# Patient Record
Sex: Female | Born: 1980
Health system: Southern US, Community
[De-identification: ages and names within clinical notes are randomized; demographics above are authoritative.]

## PROBLEM LIST (undated history)

## (undated) ENCOUNTER — Inpatient Hospital Stay (HOSPITAL_COMMUNITY): Payer: Self-pay

## (undated) DIAGNOSIS — M5020 Other cervical disc displacement, unspecified cervical region: Secondary | ICD-10-CM

## (undated) DIAGNOSIS — M7918 Myalgia, other site: Secondary | ICD-10-CM

## (undated) DIAGNOSIS — F32A Depression, unspecified: Secondary | ICD-10-CM

## (undated) DIAGNOSIS — F329 Major depressive disorder, single episode, unspecified: Secondary | ICD-10-CM

## (undated) DIAGNOSIS — I8390 Asymptomatic varicose veins of unspecified lower extremity: Secondary | ICD-10-CM

## (undated) DIAGNOSIS — E042 Nontoxic multinodular goiter: Secondary | ICD-10-CM

## (undated) DIAGNOSIS — H1132 Conjunctival hemorrhage, left eye: Secondary | ICD-10-CM

## (undated) DIAGNOSIS — M7711 Lateral epicondylitis, right elbow: Secondary | ICD-10-CM

## (undated) DIAGNOSIS — E785 Hyperlipidemia, unspecified: Secondary | ICD-10-CM

## (undated) DIAGNOSIS — O09899 Supervision of other high risk pregnancies, unspecified trimester: Secondary | ICD-10-CM

## (undated) DIAGNOSIS — M5136 Other intervertebral disc degeneration, lumbar region: Secondary | ICD-10-CM

## (undated) DIAGNOSIS — R87629 Unspecified abnormal cytological findings in specimens from vagina: Secondary | ICD-10-CM

## (undated) DIAGNOSIS — E049 Nontoxic goiter, unspecified: Secondary | ICD-10-CM

## (undated) DIAGNOSIS — M25559 Pain in unspecified hip: Secondary | ICD-10-CM

## (undated) DIAGNOSIS — O09529 Supervision of elderly multigravida, unspecified trimester: Secondary | ICD-10-CM

## (undated) DIAGNOSIS — M222X9 Patellofemoral disorders, unspecified knee: Secondary | ICD-10-CM

## (undated) DIAGNOSIS — M62830 Muscle spasm of back: Secondary | ICD-10-CM

## (undated) DIAGNOSIS — D649 Anemia, unspecified: Secondary | ICD-10-CM

## (undated) DIAGNOSIS — N3281 Overactive bladder: Secondary | ICD-10-CM

## (undated) DIAGNOSIS — H8110 Benign paroxysmal vertigo, unspecified ear: Secondary | ICD-10-CM

## (undated) HISTORY — DX: Benign paroxysmal vertigo, unspecified ear: H81.10

## (undated) HISTORY — DX: Pain in unspecified hip: M25.559

## (undated) HISTORY — DX: Supervision of elderly multigravida, unspecified trimester: O09.529

## (undated) HISTORY — DX: Unspecified abnormal cytological findings in specimens from vagina: R87.629

## (undated) HISTORY — DX: Asymptomatic varicose veins of unspecified lower extremity: I83.90

## (undated) HISTORY — DX: Supervision of other high risk pregnancies, unspecified trimester: O09.899

## (undated) HISTORY — DX: Conjunctival hemorrhage, left eye: H11.32

## (undated) HISTORY — DX: Patellofemoral disorders, unspecified knee: M22.2X9

## (undated) HISTORY — DX: Muscle spasm of back: M62.830

## (undated) HISTORY — DX: Lateral epicondylitis, right elbow: M77.11

## (undated) HISTORY — DX: Hyperlipidemia, unspecified: E78.5

## (undated) HISTORY — DX: Other intervertebral disc degeneration, lumbar region: M51.36

## (undated) HISTORY — DX: Overactive bladder: N32.81

## (undated) HISTORY — DX: Myalgia, other site: M79.18

## (undated) HISTORY — DX: Nontoxic multinodular goiter: E04.2

---
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
## 2008-10-19 DIAGNOSIS — E042 Nontoxic multinodular goiter: Secondary | ICD-10-CM

## 2008-10-19 HISTORY — DX: Nontoxic multinodular goiter: E04.2

## 2010-12-14 NOTE — L&D Delivery Note (Signed)
Delivery Note At 12:33 PM a viable and healthy female was delivered via Vaginal, Spontaneous Delivery (Presentation: Left Occiput Anterior).  APGAR: 9, 9; weight 6 lb 15 oz (3147 g).   Placenta status: Intact, Spontaneous.  Cord: 3 vessels with the following complications: None.  Pushed x 1 hr  and delivered in squatting position.   Anesthesia:  Local  Episiotomy: None Lacerations: 2nd degree;Perineal Suture Repair: 3.0 vicryl rapide Est. Blood Loss (mL): 350  Mom to postpartum.  Baby to nursery-stable.  Dawn [name] 07/25/2011, 1:24 PM

## 2010-12-22 ENCOUNTER — Ambulatory Visit
Admission: RE | Admit: 2010-12-22 | Discharge: 2010-12-22 | Payer: Self-pay | Source: Home / Self Care | Attending: Obstetrics & Gynecology | Admitting: Obstetrics & Gynecology

## 2010-12-23 ENCOUNTER — Encounter: Payer: Self-pay | Admitting: Physician Assistant

## 2010-12-23 LAB — CONVERTED CEMR LAB
Antibody Screen: NEGATIVE
Eosinophils Relative: 1 % (ref 0–5)
HCT: 37.6 % (ref 36.0–46.0)
Hemoglobin: 12.6 g/dL (ref 12.0–15.0)
Lymphocytes Relative: 25 % (ref 12–46)
Monocytes Absolute: 0.7 10*3/uL (ref 0.1–1.0)
Monocytes Relative: 7 % (ref 3–12)
Neutro Abs: 6.5 10*3/uL (ref 1.7–7.7)
RBC: 4.33 M/uL (ref 3.87–5.11)
Rh Type: POSITIVE
Rubella: >500 intl units/mL — ABNORMAL HIGH
TSH: 0.416 microintl units/mL (ref 0.350–4.500)

## 2011-01-07 ENCOUNTER — Other Ambulatory Visit: Payer: Self-pay | Admitting: Obstetrics & Gynecology

## 2011-01-07 ENCOUNTER — Ambulatory Visit
Admission: RE | Admit: 2011-01-07 | Discharge: 2011-01-07 | Payer: Self-pay | Source: Home / Self Care | Attending: Obstetrics & Gynecology | Admitting: Obstetrics & Gynecology

## 2011-01-07 DIAGNOSIS — Z3682 Encounter for antenatal screening for nuchal translucency: Secondary | ICD-10-CM

## 2011-01-22 ENCOUNTER — Other Ambulatory Visit (HOSPITAL_COMMUNITY): Payer: Self-pay

## 2011-01-22 ENCOUNTER — Ambulatory Visit (HOSPITAL_COMMUNITY)
Admission: RE | Admit: 2011-01-22 | Discharge: 2011-01-22 | Disposition: A | Discharge status: Home / Self Care | Payer: Federal, State, Local not specified - PPO | Source: Ambulatory Visit | Attending: Obstetrics & Gynecology | Admitting: Obstetrics & Gynecology

## 2011-01-22 ENCOUNTER — Encounter (HOSPITAL_COMMUNITY): Payer: Self-pay

## 2011-01-22 DIAGNOSIS — Z3689 Encounter for other specified antenatal screening: Secondary | ICD-10-CM | POA: Insufficient documentation

## 2011-01-22 DIAGNOSIS — O351XX0 Maternal care for (suspected) chromosomal abnormality in fetus, not applicable or unspecified: Secondary | ICD-10-CM | POA: Insufficient documentation

## 2011-01-22 DIAGNOSIS — Z3682 Encounter for antenatal screening for nuchal translucency: Secondary | ICD-10-CM

## 2011-01-22 DIAGNOSIS — O3510X0 Maternal care for (suspected) chromosomal abnormality in fetus, unspecified, not applicable or unspecified: Secondary | ICD-10-CM | POA: Insufficient documentation

## 2011-02-02 ENCOUNTER — Other Ambulatory Visit: Payer: Self-pay | Admitting: Obstetrics & Gynecology

## 2011-02-02 DIAGNOSIS — O26849 Uterine size-date discrepancy, unspecified trimester: Secondary | ICD-10-CM

## 2011-02-02 DIAGNOSIS — Z3400 Encounter for supervision of normal first pregnancy, unspecified trimester: Secondary | ICD-10-CM

## 2011-02-17 ENCOUNTER — Encounter: Payer: Federal, State, Local not specified - PPO | Admitting: Obstetrics & Gynecology

## 2011-02-17 DIAGNOSIS — Z3480 Encounter for supervision of other normal pregnancy, unspecified trimester: Secondary | ICD-10-CM

## 2011-02-18 ENCOUNTER — Encounter: Payer: Self-pay | Admitting: Obstetrics & Gynecology

## 2011-02-19 ENCOUNTER — Ambulatory Visit (HOSPITAL_COMMUNITY)
Admission: RE | Admit: 2011-02-19 | Discharge: 2011-02-19 | Disposition: A | Discharge status: Home / Self Care | Payer: Federal, State, Local not specified - PPO | Source: Ambulatory Visit | Attending: Obstetrics & Gynecology | Admitting: Obstetrics & Gynecology

## 2011-02-19 DIAGNOSIS — O351XX0 Maternal care for (suspected) chromosomal abnormality in fetus, not applicable or unspecified: Secondary | ICD-10-CM | POA: Insufficient documentation

## 2011-02-19 DIAGNOSIS — Z3689 Encounter for other specified antenatal screening: Secondary | ICD-10-CM | POA: Insufficient documentation

## 2011-02-19 DIAGNOSIS — O3510X0 Maternal care for (suspected) chromosomal abnormality in fetus, unspecified, not applicable or unspecified: Secondary | ICD-10-CM | POA: Insufficient documentation

## 2011-02-26 ENCOUNTER — Ambulatory Visit (HOSPITAL_COMMUNITY)
Admission: RE | Admit: 2011-02-26 | Discharge: 2011-02-26 | Disposition: A | Discharge status: Home / Self Care | Payer: Federal, State, Local not specified - PPO | Source: Ambulatory Visit | Attending: Obstetrics & Gynecology | Admitting: Obstetrics & Gynecology

## 2011-02-26 ENCOUNTER — Other Ambulatory Visit: Payer: Self-pay | Admitting: Obstetrics & Gynecology

## 2011-02-26 DIAGNOSIS — O26849 Uterine size-date discrepancy, unspecified trimester: Secondary | ICD-10-CM

## 2011-02-26 DIAGNOSIS — Z363 Encounter for antenatal screening for malformations: Secondary | ICD-10-CM | POA: Insufficient documentation

## 2011-02-26 DIAGNOSIS — Z1389 Encounter for screening for other disorder: Secondary | ICD-10-CM | POA: Insufficient documentation

## 2011-02-26 DIAGNOSIS — O358XX0 Maternal care for other (suspected) fetal abnormality and damage, not applicable or unspecified: Secondary | ICD-10-CM | POA: Insufficient documentation

## 2011-03-16 DIAGNOSIS — Z3480 Encounter for supervision of other normal pregnancy, unspecified trimester: Secondary | ICD-10-CM

## 2011-04-13 ENCOUNTER — Encounter (INDEPENDENT_AMBULATORY_CARE_PROVIDER_SITE_OTHER): Payer: Federal, State, Local not specified - PPO

## 2011-04-13 DIAGNOSIS — Z3480 Encounter for supervision of other normal pregnancy, unspecified trimester: Secondary | ICD-10-CM

## 2011-04-20 ENCOUNTER — Other Ambulatory Visit: Payer: Self-pay | Admitting: Obstetrics & Gynecology

## 2011-04-20 ENCOUNTER — Encounter (INDEPENDENT_AMBULATORY_CARE_PROVIDER_SITE_OTHER): Payer: Federal, State, Local not specified - PPO

## 2011-04-20 DIAGNOSIS — O4440 Low lying placenta NOS or without hemorrhage, unspecified trimester: Secondary | ICD-10-CM

## 2011-04-20 DIAGNOSIS — Z3480 Encounter for supervision of other normal pregnancy, unspecified trimester: Secondary | ICD-10-CM

## 2011-04-30 ENCOUNTER — Encounter: Payer: Self-pay | Admitting: Obstetrics & Gynecology

## 2011-05-01 ENCOUNTER — Ambulatory Visit (HOSPITAL_COMMUNITY)
Admission: RE | Admit: 2011-05-01 | Discharge: 2011-05-01 | Disposition: A | Discharge status: Home / Self Care | Payer: Federal, State, Local not specified - PPO | Source: Ambulatory Visit | Attending: Obstetrics & Gynecology | Admitting: Obstetrics & Gynecology

## 2011-05-01 DIAGNOSIS — O4440 Low lying placenta NOS or without hemorrhage, unspecified trimester: Secondary | ICD-10-CM

## 2011-05-01 DIAGNOSIS — Z3689 Encounter for other specified antenatal screening: Secondary | ICD-10-CM | POA: Insufficient documentation

## 2011-05-01 DIAGNOSIS — O4400 Placenta previa specified as without hemorrhage, unspecified trimester: Secondary | ICD-10-CM | POA: Insufficient documentation

## 2011-05-04 ENCOUNTER — Encounter (INDEPENDENT_AMBULATORY_CARE_PROVIDER_SITE_OTHER): Payer: Federal, State, Local not specified - PPO

## 2011-05-04 DIAGNOSIS — Z3400 Encounter for supervision of normal first pregnancy, unspecified trimester: Secondary | ICD-10-CM

## 2011-05-18 ENCOUNTER — Encounter (INDEPENDENT_AMBULATORY_CARE_PROVIDER_SITE_OTHER): Payer: Federal, State, Local not specified - PPO

## 2011-05-18 DIAGNOSIS — Z3480 Encounter for supervision of other normal pregnancy, unspecified trimester: Secondary | ICD-10-CM

## 2011-06-01 ENCOUNTER — Encounter (INDEPENDENT_AMBULATORY_CARE_PROVIDER_SITE_OTHER): Payer: Federal, State, Local not specified - PPO

## 2011-06-01 DIAGNOSIS — E049 Nontoxic goiter, unspecified: Secondary | ICD-10-CM

## 2011-06-01 DIAGNOSIS — Z3480 Encounter for supervision of other normal pregnancy, unspecified trimester: Secondary | ICD-10-CM

## 2011-06-02 ENCOUNTER — Other Ambulatory Visit: Payer: Self-pay | Admitting: Obstetrics & Gynecology

## 2011-06-02 DIAGNOSIS — Z0489 Encounter for examination and observation for other specified reasons: Secondary | ICD-10-CM

## 2011-06-02 DIAGNOSIS — IMO0002 Reserved for concepts with insufficient information to code with codable children: Secondary | ICD-10-CM

## 2011-06-09 ENCOUNTER — Other Ambulatory Visit: Payer: Self-pay | Admitting: Obstetrics & Gynecology

## 2011-06-09 ENCOUNTER — Ambulatory Visit (HOSPITAL_COMMUNITY)
Admission: RE | Admit: 2011-06-09 | Discharge: 2011-06-09 | Disposition: A | Discharge status: Home / Self Care | Payer: Federal, State, Local not specified - PPO | Source: Ambulatory Visit | Attending: Obstetrics & Gynecology | Admitting: Obstetrics & Gynecology

## 2011-06-09 ENCOUNTER — Inpatient Hospital Stay (HOSPITAL_COMMUNITY)
Admission: AD | Admit: 2011-06-09 | Discharge: 2011-06-09 | Disposition: A | Discharge status: Home / Self Care | Payer: Federal, State, Local not specified - PPO | Source: Ambulatory Visit | Attending: Obstetrics and Gynecology | Admitting: Obstetrics and Gynecology

## 2011-06-09 DIAGNOSIS — O99891 Other specified diseases and conditions complicating pregnancy: Secondary | ICD-10-CM | POA: Insufficient documentation

## 2011-06-09 DIAGNOSIS — O9989 Other specified diseases and conditions complicating pregnancy, childbirth and the puerperium: Secondary | ICD-10-CM

## 2011-06-09 DIAGNOSIS — O26849 Uterine size-date discrepancy, unspecified trimester: Secondary | ICD-10-CM

## 2011-06-09 DIAGNOSIS — Z3689 Encounter for other specified antenatal screening: Secondary | ICD-10-CM | POA: Insufficient documentation

## 2011-06-09 DIAGNOSIS — Z0489 Encounter for examination and observation for other specified reasons: Secondary | ICD-10-CM

## 2011-06-09 DIAGNOSIS — IMO0002 Reserved for concepts with insufficient information to code with codable children: Secondary | ICD-10-CM

## 2011-06-09 DIAGNOSIS — O288 Other abnormal findings on antenatal screening of mother: Secondary | ICD-10-CM

## 2011-06-09 DIAGNOSIS — O365990 Maternal care for other known or suspected poor fetal growth, unspecified trimester, not applicable or unspecified: Secondary | ICD-10-CM | POA: Insufficient documentation

## 2011-06-09 LAB — AMNISURE RUPTURE OF MEMBRANE (ROM) NOT AT ARMC: Amnisure ROM: NEGATIVE

## 2011-06-15 ENCOUNTER — Encounter (INDEPENDENT_AMBULATORY_CARE_PROVIDER_SITE_OTHER): Payer: Federal, State, Local not specified - PPO

## 2011-06-15 DIAGNOSIS — Z3400 Encounter for supervision of normal first pregnancy, unspecified trimester: Secondary | ICD-10-CM

## 2011-06-16 ENCOUNTER — Ambulatory Visit (HOSPITAL_COMMUNITY)
Admission: RE | Admit: 2011-06-16 | Discharge: 2011-06-16 | Disposition: A | Discharge status: Home / Self Care | Payer: Federal, State, Local not specified - PPO | Source: Ambulatory Visit | Attending: Obstetrics & Gynecology | Admitting: Obstetrics & Gynecology

## 2011-06-16 ENCOUNTER — Ambulatory Visit (HOSPITAL_COMMUNITY): Payer: Federal, State, Local not specified - PPO

## 2011-06-16 DIAGNOSIS — O365990 Maternal care for other known or suspected poor fetal growth, unspecified trimester, not applicable or unspecified: Secondary | ICD-10-CM | POA: Insufficient documentation

## 2011-06-16 DIAGNOSIS — O4100X0 Oligohydramnios, unspecified trimester, not applicable or unspecified: Secondary | ICD-10-CM | POA: Insufficient documentation

## 2011-06-16 DIAGNOSIS — O288 Other abnormal findings on antenatal screening of mother: Secondary | ICD-10-CM

## 2011-06-29 ENCOUNTER — Other Ambulatory Visit: Payer: Self-pay | Admitting: Advanced Practice Midwife

## 2011-06-29 ENCOUNTER — Encounter (INDEPENDENT_AMBULATORY_CARE_PROVIDER_SITE_OTHER): Payer: Federal, State, Local not specified - PPO

## 2011-06-29 DIAGNOSIS — Z3480 Encounter for supervision of other normal pregnancy, unspecified trimester: Secondary | ICD-10-CM

## 2011-06-30 LAB — GC/CHLAMYDIA PROBE AMP, GENITAL: GC Probe Amp, Genital: NEGATIVE

## 2011-07-06 ENCOUNTER — Encounter (INDEPENDENT_AMBULATORY_CARE_PROVIDER_SITE_OTHER): Payer: Federal, State, Local not specified - PPO

## 2011-07-06 DIAGNOSIS — Z3480 Encounter for supervision of other normal pregnancy, unspecified trimester: Secondary | ICD-10-CM

## 2011-07-13 ENCOUNTER — Encounter (INDEPENDENT_AMBULATORY_CARE_PROVIDER_SITE_OTHER): Payer: Federal, State, Local not specified - PPO

## 2011-07-13 DIAGNOSIS — Z3400 Encounter for supervision of normal first pregnancy, unspecified trimester: Secondary | ICD-10-CM

## 2011-07-17 ENCOUNTER — Encounter (INDEPENDENT_AMBULATORY_CARE_PROVIDER_SITE_OTHER): Payer: Federal, State, Local not specified - PPO

## 2011-07-17 ENCOUNTER — Other Ambulatory Visit: Payer: Self-pay | Admitting: Obstetrics and Gynecology

## 2011-07-17 DIAGNOSIS — Z3480 Encounter for supervision of other normal pregnancy, unspecified trimester: Secondary | ICD-10-CM

## 2011-07-17 LAB — WET PREP FOR TRICH, YEAST, CLUE: Clue Cells Wet Prep HPF POC: NONE SEEN

## 2011-07-25 ENCOUNTER — Inpatient Hospital Stay (HOSPITAL_COMMUNITY)
Admission: AD | Admit: 2011-07-25 | Discharge: 2011-07-27 | DRG: 373 | Disposition: A | Discharge status: Home / Self Care | Payer: Federal, State, Local not specified - PPO | Source: Ambulatory Visit | Attending: Family Medicine | Admitting: Family Medicine

## 2011-07-25 ENCOUNTER — Encounter (HOSPITAL_COMMUNITY): Payer: Self-pay

## 2011-07-25 DIAGNOSIS — IMO0001 Reserved for inherently not codable concepts without codable children: Secondary | ICD-10-CM

## 2011-07-25 HISTORY — DX: Nontoxic goiter, unspecified: E04.9

## 2011-07-25 LAB — CBC
HCT: 40.3 % (ref 36.0–46.0)
MCV: 92.6 fL (ref 78.0–100.0)
Platelets: 149 10*3/uL — ABNORMAL LOW (ref 150–400)
RBC: 4.35 MIL/uL (ref 3.87–5.11)
WBC: 15.2 10*3/uL — ABNORMAL HIGH (ref 4.0–10.5)

## 2011-07-25 MED ORDER — OXYTOCIN 20 UNITS IN LACTATED RINGERS INFUSION - SIMPLE: 125.0000 mL/h | INTRAVENOUS | Status: AC

## 2011-07-25 MED ORDER — WITCH HAZEL-GLYCERIN EX PADS
1.0000 "application " | MEDICATED_PAD | CUTANEOUS | Status: DC | PRN
  Administered 2011-07-25: 1 via TOPICAL

## 2011-07-25 MED ORDER — DIBUCAINE 1 % RE OINT: 1.0000 "application " | TOPICAL_OINTMENT | RECTAL | Status: DC | PRN

## 2011-07-25 MED ORDER — ZOLPIDEM TARTRATE 5 MG PO TABS: 5.0000 mg | ORAL_TABLET | Freq: Every evening | ORAL | Status: DC | PRN

## 2011-07-25 MED ORDER — OXYTOCIN BOLUS FROM INFUSION
500.0000 mL | Freq: Once | INTRAVENOUS | Status: DC
  Filled 2011-07-25: qty 1000
  Filled 2011-07-25: qty 500

## 2011-07-25 MED ORDER — IBUPROFEN 600 MG PO TABS
600.0000 mg | ORAL_TABLET | Freq: Four times a day (QID) | ORAL | Status: DC
  Administered 2011-07-26 – 2011-07-27 (×7): 600 mg via ORAL
  Filled 2011-07-25 (×7): qty 1

## 2011-07-25 MED ORDER — NALBUPHINE SYRINGE 5 MG/0.5 ML
5.0000 mg | INJECTION | INTRAMUSCULAR | Status: DC | PRN
  Administered 2011-07-25: 5 mg via INTRAVENOUS
  Filled 2011-07-25 (×2): qty 0.5

## 2011-07-25 MED ORDER — BENZOCAINE-MENTHOL 20-0.5 % EX AERO
INHALATION_SPRAY | CUTANEOUS | Status: AC
  Administered 2011-07-25: 1 via TOPICAL
  Filled 2011-07-25: qty 56

## 2011-07-25 MED ORDER — CITRIC ACID-SODIUM CITRATE 334-500 MG/5ML PO SOLN: 30.0000 mL | ORAL | Status: DC | PRN

## 2011-07-25 MED ORDER — NALBUPHINE HCL 10 MG/ML IJ SOLN
5.0000 mg | Freq: Once | INTRAMUSCULAR | Status: AC
  Administered 2011-07-25: 5 mg via INTRAVENOUS

## 2011-07-25 MED ORDER — NALOXONE HCL 0.4 MG/ML IJ SOLN
INTRAMUSCULAR | Status: AC
  Filled 2011-07-25: qty 2

## 2011-07-25 MED ORDER — ONDANSETRON HCL 4 MG PO TABS: 4.0000 mg | ORAL_TABLET | ORAL | Status: DC | PRN

## 2011-07-25 MED ORDER — LACTATED RINGERS IV SOLN
INTRAVENOUS | Status: DC
  Administered 2011-07-25: 04:00:00 via INTRAVENOUS

## 2011-07-25 MED ORDER — DIPHENHYDRAMINE HCL 25 MG PO CAPS: 25.0000 mg | ORAL_CAPSULE | Freq: Four times a day (QID) | ORAL | Status: DC | PRN

## 2011-07-25 MED ORDER — IBUPROFEN 600 MG PO TABS
600.0000 mg | ORAL_TABLET | Freq: Four times a day (QID) | ORAL | Status: DC
  Administered 2011-07-25: 600 mg via ORAL

## 2011-07-25 MED ORDER — TETANUS-DIPHTH-ACELL PERTUSSIS 5-2.5-18.5 LF-MCG/0.5 IM SUSP: 0.5000 mL | Freq: Once | INTRAMUSCULAR | Status: DC

## 2011-07-25 MED ORDER — ONDANSETRON HCL 4 MG/2ML IJ SOLN: 4.0000 mg | INTRAMUSCULAR | Status: DC | PRN

## 2011-07-25 MED ORDER — BENZOCAINE-MENTHOL 20-0.5 % EX AERO
INHALATION_SPRAY | CUTANEOUS | Status: AC
  Filled 2011-07-25: qty 56

## 2011-07-25 MED ORDER — SIMETHICONE 80 MG PO CHEW: 80.0000 mg | CHEWABLE_TABLET | ORAL | Status: DC | PRN

## 2011-07-25 MED ORDER — BENZOCAINE-MENTHOL 20-0.5 % EX AERO: 1.0000 "application " | INHALATION_SPRAY | CUTANEOUS | Status: DC | PRN

## 2011-07-25 MED ORDER — OXYTOCIN 20 UNITS IN LACTATED RINGERS INFUSION - SIMPLE
1.0000 m[IU]/min | Freq: Once | INTRAVENOUS | Status: AC
  Administered 2011-07-25: 333 m[IU]/min via INTRAVENOUS

## 2011-07-25 MED ORDER — FLEET ENEMA 7-19 GM/118ML RE ENEM: 1.0000 | ENEMA | RECTAL | Status: DC | PRN

## 2011-07-25 MED ORDER — IBUPROFEN 600 MG PO TABS
600.0000 mg | ORAL_TABLET | Freq: Four times a day (QID) | ORAL | Status: DC | PRN
  Filled 2011-07-25: qty 1

## 2011-07-25 MED ORDER — OXYCODONE-ACETAMINOPHEN 5-325 MG PO TABS: 2.0000 | ORAL_TABLET | ORAL | Status: DC | PRN

## 2011-07-25 MED ORDER — LANOLIN HYDROUS EX OINT: TOPICAL_OINTMENT | CUTANEOUS | Status: DC | PRN

## 2011-07-25 MED ORDER — ONDANSETRON HCL 4 MG/2ML IJ SOLN: 4.0000 mg | Freq: Four times a day (QID) | INTRAMUSCULAR | Status: DC | PRN

## 2011-07-25 MED ORDER — WITCH HAZEL-GLYCERIN EX PADS: 1.0000 "application " | MEDICATED_PAD | CUTANEOUS | Status: DC | PRN

## 2011-07-25 MED ORDER — BENZOCAINE-MENTHOL 20-0.5 % EX AERO
1.0000 "application " | INHALATION_SPRAY | CUTANEOUS | Status: DC | PRN
  Administered 2011-07-25: 1 via TOPICAL

## 2011-07-25 MED ORDER — NALBUPHINE SYRINGE 5 MG/0.5 ML
INJECTION | INTRAMUSCULAR | Status: AC
  Administered 2011-07-25: 5 mg via INTRAVENOUS
  Filled 2011-07-25: qty 0.5

## 2011-07-25 MED ORDER — PRENATAL PLUS 27-1 MG PO TABS
1.0000 | ORAL_TABLET | Freq: Every day | ORAL | Status: DC
  Administered 2011-07-25: 1 via ORAL
  Filled 2011-07-25: qty 1

## 2011-07-25 MED ORDER — PRENATAL PLUS 27-1 MG PO TABS
1.0000 | ORAL_TABLET | Freq: Every day | ORAL | Status: DC
  Administered 2011-07-26 – 2011-07-27 (×2): 1 via ORAL
  Filled 2011-07-25 (×2): qty 1

## 2011-07-25 MED ORDER — ACETAMINOPHEN 325 MG PO TABS: 650.0000 mg | ORAL_TABLET | ORAL | Status: DC | PRN

## 2011-07-25 MED ORDER — SENNOSIDES-DOCUSATE SODIUM 8.6-50 MG PO TABS
2.0000 | ORAL_TABLET | Freq: Every day | ORAL | Status: DC
  Administered 2011-07-25 – 2011-07-26 (×2): 2 via ORAL

## 2011-07-25 MED ORDER — OXYCODONE-ACETAMINOPHEN 5-325 MG PO TABS
1.0000 | ORAL_TABLET | ORAL | Status: DC | PRN
  Administered 2011-07-25: 1 via ORAL
  Filled 2011-07-25: qty 1

## 2011-07-25 MED ORDER — LACTATED RINGERS IV SOLN
500.0000 mL | INTRAVENOUS | Status: DC | PRN
Start: 2011-07-25 — End: 2011-07-25

## 2011-07-25 MED ORDER — SENNOSIDES-DOCUSATE SODIUM 8.6-50 MG PO TABS: 2.0000 | ORAL_TABLET | Freq: Every day | ORAL | Status: DC

## 2011-07-25 MED ORDER — LIDOCAINE HCL (PF) 1 % IJ SOLN
30.0000 mL | INTRAMUSCULAR | Status: DC | PRN
  Administered 2011-07-25: 30 mL via SUBCUTANEOUS
  Filled 2011-07-25: qty 30

## 2011-07-25 NOTE — Progress Notes (Signed)
  Subjective:    Patient ID: Dawn [name], female    DOB: [birth year], 30 y.o.   MRN: [id number]  HPI G1 at 39.4 wks. With uncomplicted PN course at [facility] presented at 2 cm and had SROM clear fluid at 0215. Some relief after Nubain at 0700 and requests more. Declines epidural.Last cx xam 2 hr ago by RN: 8/100.    Review of Systems Fatigued but coping with UCs and rectal pressure. Legs tired after hands/knees position.     Objective:   Physical Exam Filed Vitals:   07/25/11 0923  BP: 108/55  Pulse: 77  Temp:   Resp:    UCs: q 3 min. FHR: 120-125, mod variability, reactive Cx: 7/85%/+1 ROA, small amt clear fluid and show        Assessment & Plan:  Progressive active phase. Nubain 5 mg IVP Continue repositioning for comfort.

## 2011-07-25 NOTE — Progress Notes (Signed)
Dawn [name] is a 30 y.o. G1P0 at [redacted]w[redacted]d by LMP in active labor  Subjective:  Urge to push Objective: BP 107/70  Pulse 103  Temp(Src) 97.7 F (36.5 C) (Axillary)  Resp 22  Ht 5\' 4"  (1.626 m)  Wt 74.617 kg (164 lb 8 oz)  BMI 28.24 kg/m2  SpO2 100%      FHT:  FHR: 120 bpm, variability: moderate,  accelerations:  Present,  decelerations:  Present variable UC:   regular, every 3 minutes SVE:   Dilation: 10 Effacement (%): 100 Station: +2 Exam by:: deidra poe cnm  Labs: Lab Results  Component Value Date   WBC 15.2* 07/25/2011   HGB 13.5 07/25/2011   HCT 40.3 07/25/2011   MCV 92.6 07/25/2011   PLT 149* 07/25/2011    Assessment / Plan: Spontaneous labor, progressing normally  Labor: Progressing normally  Fetal Wellbeing:  Category II Pain Control:  Labor support without medications  Anticipated MOD:  NSVD  POE,DEIRDRE 07/25/2011, 11:23 AM

## 2011-07-25 NOTE — Progress Notes (Signed)
Dawn [name] is a 30 y.o. G1P0 at [redacted]w[redacted]d admitted for active labor, rupture of membranes  Subjective:   Objective: BP 106/60  Pulse 82  Temp(Src) 97.4 F (36.3 C) (Oral)  Resp 20  Ht 5\' 4"  (1.626 m)  Wt 74.617 kg (164 lb 8 oz)  BMI 28.24 kg/m2      FHT:  FHR: 145 bpm, variability: moderate,  accelerations:  Present,  decelerations:  Absent UC:   regular, every 4-5 minutes SVE:   Dilation: 6.5 Effacement (%): 90;100 Station: -1 Exam by:: Dr Dawn [name]   Assessment / Plan: Spontaneous labor, progressing normally. CNM paged.  Labor: Progressing normally Fetal Wellbeing:  Category I Pain Control:  Labor support without medications I/D:  [name]/a Anticipated MOD:  NSVD  Dawn [name] 07/25/2011, 6:45 AM

## 2011-07-25 NOTE — H&P (Signed)
Dawn [name] is a 30 y.o. female presenting for contactions. While waiting to be seen, her water broke. Maternal Medical History:  Reason for admission: Reason for admission: rupture of membranes and contractions.  Contractions: Onset was more than 2 days ago.   Frequency: regular.   Duration is approximately 60 seconds.   Perceived severity is moderate.    Fetal activity: Perceived fetal activity is normal.   Last perceived fetal movement was within the past hour.    Prenatal complications: No bleeding, infection, pre-eclampsia or preterm labor.   Prenatal Complications - Diabetes: none.    OB History    Grav Para Term Preterm Abortions TAB SAB Ect Mult Living   1              Past Medical History  Diagnosis Date  . Thyroid goiter     benign   Past Surgical History  Procedure Date  . Tonsillectomy    Family History: family history includes Birth defects in her maternal grandfather and maternal grandmother. Social History:  reports that she has never smoked. She does not have any smokeless tobacco history on file. She reports that she does not drink alcohol or use illicit drugs.  Review of Systems  Constitutional: Negative.   HENT: Negative.   Eyes: Negative.   Respiratory: Negative.   Cardiovascular: Negative.   Gastrointestinal: Negative.   Genitourinary: Negative.   Musculoskeletal: Negative.   Skin: Negative.   Neurological: Negative.   Endo/Heme/Allergies: Negative.   Psychiatric/Behavioral: Negative.     Dilation: 2 Effacement (%): 90 Station: -3 Exam by:: dr Natale Milch Blood pressure 128/82, temperature 98.1 F (36.7 C), resp. rate 20, height 5\' 4"  (1.626 m), weight 74.617 kg (164 lb 8 oz). Maternal Exam:  Abdomen: Fetal presentation: vertex  Introitus: Normal vulva. Normal vagina.  Ferning test: positive.  Nitrazine test: not done. Amniotic fluid character: clear.  Pelvis: adequate for delivery.   Cervix: Cervix evaluated by digital exam.      Physical Exam  Constitutional: She is oriented to person, place, and time. She appears well-developed and well-nourished. No distress.  HENT:  Head: Normocephalic.  Eyes: Pupils are equal, round, and reactive to light.  Neck: Normal range of motion.  Cardiovascular: Normal rate, regular rhythm, normal heart sounds and intact distal pulses.   No murmur heard. Respiratory: Effort normal and breath sounds normal. She has no wheezes. She has no rales. She exhibits no tenderness.  GI: Soft. Bowel sounds are normal. There is no tenderness. There is no guarding.  Genitourinary: Vagina normal and uterus normal.  Musculoskeletal: Normal range of motion.  Neurological: She is alert and oriented to person, place, and time. She has normal reflexes. She displays normal reflexes. No cranial nerve deficit.  Skin: Skin is warm and dry. No rash noted. She is not diaphoretic.  Psychiatric: She has a normal mood and affect.  Dilation: 2 Effacement (%): 90 Cervical Position: Posterior Station: -3 Presentation: Vertex Exam by:: dr Natale Milch   Prenatal labs: ABO, Rh:  AB+ Antibody: NEG (01/10 0337) Rubella:  Immune RPR: NON REAC (01/10 0337)  HBsAg: NEGATIVE (01/10 0337)  HIV: NON REACTIVE (01/10 0337)  GBS: NEGATIVE (07/16 1600)  GC/Ch neg/neg 1 hr : 106 Sequential Screening : NML  Assessment/Plan: IUP @39 .6 with SROM, clear, at 0215 with regular contractions. Admission for L&D. GBS neg. Plans to breastfeed. Currently undecided about birth control. Receives care at [place], and desires midwife present at delivery. Will contact midwife. Current desire for no meds  or epidural. Anticipate NSVD.   Dawn [name] N 07/25/2011, 2:39 AM

## 2011-07-25 NOTE — Progress Notes (Signed)
Patient is here for labor eval. She states that her ctx are q22m for an hour. She denies any vaginal bleeding, lof or discharge. She reports good fetal movement.

## 2011-07-25 NOTE — Progress Notes (Signed)
Dawn [name] is a 30 y.o. G1P0 at [redacted]w[redacted]d admitted for rupture of membranes  Subjective:   Objective: BP 106/60  Pulse 82  Temp 97.7 F (36.5 C)  Resp 20  Ht 5\' 4"  (1.626 m)  Wt 74.617 kg (164 lb 8 oz)  BMI 28.24 kg/m2      FHT:  FHR: 120 bpm, variability: moderate,  accelerations:  Present,  decelerations:  Absent UC:   regular, every 4-5 minutes SVE:   Dilation: 2 Effacement (%): 90 Station: -3 Exam by:: dr Natale Milch  Labs: Lab Results  Component Value Date   WBC 9.7 12/23/2010   HGB 12.6 12/23/2010   HCT 37.6 12/23/2010   MCV 86.8 12/23/2010   PLT 192 12/23/2010    Assessment / Plan: Spontaneous labor, progressing normally. Reviewed pt's birth plan. Will check cervix in approximately 2 hours and if no progress, will start pitocin. Pt aware and agreeable. Will notify midwife of patient in labor once cervical change begins to be noted.  Labor: Progressing normally Fetal Wellbeing:  Category I Pain Control:  Labor support without medications I/D:  [name]/a Anticipated MOD:  NSVD  Dawn [name] 07/25/2011, 4:17 AM

## 2011-07-25 NOTE — Progress Notes (Signed)
MD called due to pt being up to shower unexpectedly.  Order rcvd for pt to be removed from EFM while in shower.  Pt instructed to call when out of shower per MD request to perform vag exam. Pt voices understanding.

## 2011-07-25 NOTE — Progress Notes (Signed)
Pt states, " I've had contractions since Wed night, now they are more intense and regular and about every 5 min."

## 2011-07-26 NOTE — Progress Notes (Signed)
BABY NOW SHOWING FEEDING CUES BUT UNABLE TO LATCH ONTO RIGHT SIDE.  #24 NIPPLE SHIELD USED AND BABY NURSED .  COLOSTRUM IN SHIELD WHEN BABY CAME OFF.  BABY THEN LATCHED WELL ONTO LEFT BREAST WITHOUT SHIELD AND NURSED WELL.  INSTRUCTED PATIENT TO PUMP X 10-15 MIN. PC. AND DROPPER FEED ANY EXPRESSED MILK.

## 2011-07-26 NOTE — Progress Notes (Signed)
ASSIST ATTEMPTED BUT BABY SHOWING NO INTEREST OR FEEDING CUES.  LAST GOOD FEEDING PER MOM WAS 14 HOURS AGO.  MANY ATTEMPTS SINCE BUT BABY NOT SHOWING INTEREST.  DEBP SET UP AND INITIATED.  PATIENT PUMPED X 10 MINUTES AND OBTAINED GTTS OF COLOSTRUM WHICH BABY TOOK ON GLOVED FINGER.

## 2011-07-26 NOTE — Progress Notes (Signed)
Post Partum Day 1 Subjective: no complaints and tolerating PO, normal lochia, absent BM, absent flatus, plans to breastfeed, oral progesterone-only contraceptive  Objective: Blood pressure 103/70, pulse 84, temperature 98.5 F (36.9 C), temperature source Oral, resp. rate 18, height 5\' 4"  (1.626 m), weight 74.617 kg (164 lb 8 oz), SpO2 100.00%, unknown if currently breastfeeding.  Physical Exam:  General: alert and cooperative Lochia: appropriate Chest: CTAB Heart: RRR no m/r/g Abdomen: +BS, soft, nontender,  Uterine Fundus: FF @ umbilicus DVT Evaluation: No evidence of DVT seen on physical exam. Extremities: no c/c/e   Basename 07/25/11 0405  HGB 13.5  HCT 40.3    Assessment/Plan: Plan for discharge tomorrow Continue routine pp care.   LOS: 1 day   Dawn [name] 07/26/2011, 6:38 AM

## 2011-07-27 ENCOUNTER — Encounter: Payer: Federal, State, Local not specified - PPO | Admitting: Advanced Practice Midwife

## 2011-07-27 MED ORDER — PRENATAL PLUS 27-1 MG PO TABS: 1.0000 | ORAL_TABLET | Freq: Every day | ORAL | Status: DC

## 2011-07-27 MED ORDER — OXYCODONE-ACETAMINOPHEN 5-325 MG PO TABS: 1.0000 | ORAL_TABLET | ORAL | Status: AC | PRN

## 2011-07-27 MED ORDER — IBUPROFEN 600 MG PO TABS: 600.0000 mg | ORAL_TABLET | Freq: Four times a day (QID) | ORAL | Status: AC

## 2011-07-27 MED ORDER — BENZOCAINE-MENTHOL 20-0.5 % EX AERO: 1.0000 "application " | INHALATION_SPRAY | CUTANEOUS | Status: DC | PRN

## 2011-07-27 NOTE — Progress Notes (Signed)
Post Partum Day 2 Subjective: no complaints, up ad lib, voiding, tolerating PO and + flatus  Objective: Blood pressure 107/66, pulse 79, temperature 97.6 F (36.4 C), temperature source Oral, resp. rate 18, height 5\' 4"  (1.626 m), weight 74.617 kg (164 lb 8 oz), SpO2 100.00%, unknown if currently breastfeeding.  Physical Exam:  General: alert, cooperative, appears stated age and no distress Lochia: appropriate Uterine Fundus: firm Incision:  DVT Evaluation: No evidence of DVT seen on physical exam. Negative Homan's sign. No cords or calf tenderness. No significant calf/ankle edema.   Basename 07/25/11 0405  HGB 13.5  HCT 40.3    Assessment/Plan: Discharge home, follow up at [place] in 6 weeks.   LOS: 2 days   Dawn [name] 07/27/2011, 6:26 AM

## 2011-07-27 NOTE — Discharge Summary (Signed)
Obstetric Discharge Summary Reason for Admission: onset of labor Prenatal Procedures: ultrasound Intrapartum Procedures: spontaneous vaginal delivery Postpartum Procedures: none Complications-Operative and Postpartum: 2 degree perineal laceration Hemoglobin  Date Value Range Status  07/25/2011 13.5  12.0-15.0 (g/dL) Final     HCT  Date Value Range Status  07/25/2011 40.3  36.0-46.0 (%) Final    Discharge Diagnoses: Term Pregnancy-delivered  Discharge Information: Date: 07/27/2011 Activity: pelvic rest Diet: routine Medications: PNV, Ibuprophen, Iron and Percocet Condition: stable and improved Instructions: refer to practice specific booklet Discharge to: home   Newborn Data: Live born female  Birth Weight: 6 lb 15 oz (3147 g) APGAR: 9, 9  Home with mother.  Dawn [name] 07/27/2011, 6:37 AM

## 2011-07-30 NOTE — Discharge Summary (Signed)
Agree with above note.  Dawn [name] H. 07/30/2011 11:37 AM

## 2011-09-04 ENCOUNTER — Ambulatory Visit: Payer: Federal, State, Local not specified - PPO | Admitting: Advanced Practice Midwife

## 2011-09-04 NOTE — Patient Instructions (Signed)
Breast Pumping Tips Pumping your breast milk is a good way to stimulate milk production and have a steady supply of breast milk for your infant. Pumping is most helpful during your infant's growth spurts, when involving dad or a family member, or when you are away. There are several types of pumps available. They can be purchased at a baby or maternity store. You can begin pumping soon after delivery, but some experts believe that you should wait about four weeks to give your infant a bottle.  In general, the more you breastfeed or pump, the more milk you will have for your infant. It is also important to take good care of yourself. This will reduce stress and help your body to create a healthy supply of milk. Your caregiver or lactation consultant can give you the information and support you need in your efforts to breastfeed your infant. PUMPING BREAST MILK  Follow the tips below for successful breast pumping. Take care of yourself.  Drink enough water or fluids to keep urine clear or pale yellow. You may notice a thirsty feeling while breastfeeding. This is because your body needs more water to make breast milk. Keep a large water bottle handy. Make healthy drink choices such as unsweetened fruit juice, milk and water. Limit soda, coffee, and alcohol (wait 2 hours to feed or pump if you have an alcoholic drink.)   Eat a healthy, well-balanced diet rich in fruits, vegetables, and whole grains.   Exercise as recommended by your caregiver.   Get plenty of sleep. Sleep when your infant sleeps. Ask friends and family for help if you need time to nap or rest.   Do not smoke. Smoking can lower your milk supply and harm your infant. If you need help quitting, ask your caregiver for a program recommendation.   Ask your caregiver about birth control options. Birth control pills may lower your milk supply. You may be advised to use condoms or other forms of birth control.  Relax and pump Stimulating your  let-down reflex is the key to successful and effective pumping. This makes the milk in all parts of the breast flow more freely.   It is easier to pump breast milk (and breastfeed) while you are relaxed. Find techniques that work for you. Quiet private spaces, breast massage, soothing heat placed on the breast, music, and pictures or a tape recording of your infant may help you to relax and "let down" your milk. If you have difficulty with your let down, try smelling one of your infant's blankets or an item of clothing he or she has worn while you are pumping.   When pumping, place the special suction cup (flange) directly over the nipple. It may be uncomfortable and cause nipple damage if it is not placed properly or is the wrong size. Applying a small amount of purified or modified lanolin to your nipple and the areola may help increase your comfort level. Also, you can change the speed and suction of many electric pumps to your comfort level. Your caregiver or lactation consultant can help you with this.   If pumping continues to be painful, or you feel you are not getting very much milk when you pump, you may need a different type of pump. A lactation consultant can help you determine if this is the case.   If you are with your infant, feed your him or her on demand and try pumping after each feeding. This will boost your production, even  if milk does not come out. You may not be able to pump much milk at first, but keep up the routine, and this will change.   If you are working or away from your infant for several hours, try pumping for about 15 minutes every 2 to 3 hours. Pump both breasts at the same time if you can.   If your infant has a formula feeding, make sure you pump your milk around the same time to maintain your supply.   Begin pumping breast milk a few weeks before you return to work. This will help you develop techniques that work for you and will be able to store extra milk.    Find a source of breastfeeding information that works well for you.  TIPS FOR STORING BREAST MILK  Store breast milk in a sealable sterile bag, jar, or container provided with your pumping supplies.   Store milk in small amounts close to what your infant is drinking at each feeding.   Cool pumped milk in a refrigerator or cooler. Pumped milk can last at the back of the refrigerator for 3 to 8 days.   Place cooled milk at the back of the freezer for up to 3 months.   Thaw the milk in its container or bag in warm water up to 24 hours in advance. Do not use a microwave to thaw or heat milk. Do not refreeze the milk after it has been thawed.   Breast milk is safe to drink when left at room temperature (mid 70's or colder) for 4 to 8 hours. After that, throw it away.   Milk fat can separate and look funny. The color can vary slightly from day to day. This is normal. Always shake the milk before using it to mix the fat with the more watery portion.  SEEK MEDICAL CARE IF:  You are having trouble pumping or feeding your infant.   You are concerned that you are not making enough milk.   You have nipple pain, soreness, or redness.   You have other questions or concerns related to you or your infant.  Document Released: 05/20/2010  [facility] [place] Patient Information 2011 [place], [place].IMPORTANT: HOW TO USE THIS INFORMATION:  This is a summary and does NOT have all possible information about this product. This information does not assure that this product is safe, effective, or appropriate for you. This information is not individual medical advice and does not substitute for the advice of your health care professional. Always ask your health care professional for complete information about this product and your specific health needs.    LEVONORGESTREL-RELEASING IMPLANT - INTRAUTERINE ([id number]-nor-JEST-rell)    COMMON BRAND NAME(S): Mirena    USES:  This product is a small, flexible device that is  placed in the womb (uterus) to prevent pregnancy. It is used in women who desire reversible birth control that works for a long time (up to 5 years). The device works by slowly releasing a hormone (levonorgestrel) that is similar to a certain substance made by a woman's body. This product is only intended for women who have previously given birth and have only one sexual partner. It is not meant for women with a history of certain infections/conditions (e.g., pelvic inflammatory disease, sexually transmitted disease, a certain problem pregnancy called ectopic pregnancy). For more information, consult your doctor. The use of this medication device does not protect you or your partner against sexually transmitted diseases (e.g., HIV, gonorrhea). Carefully read all of the  information provided by your doctor, and ask any questions you may have about this product or other birth control methods that may be right for you.    HOW TO USE:  Read the Patient Information Leaflet provided by your pharmacist before this medication device is inserted and each time it is re-inserted. The leaflet contains very important information about side effects and when it is important to call your doctor. If you have any questions, consult your doctor or pharmacist. This product is inserted into your uterus by a properly trained health care professional, usually once every 5 years or as determined by your doctor. The medication in the device is slowly released into the body over a 5-year period. Have a follow-up appointment 4-12 weeks after insertion of this product to check that it is still correctly in place. If you still desire birth control after 5 years, the medication device may be replaced with a new one. The medication device may also be removed at any time by a properly trained health care professional. Learn all the instructions on how and when to check this product and its proper positioning in your body, and make sure you  understand the problems that may occur with this product. See also Precautions section.    SIDE EFFECTS:  Irregular vaginal bleeding (e.g., spotting), cramps, headache, nausea, breast pain, acne, rash, hair loss, weight gain, or decreased interest in sex may occur. If any of these effects persist or worsen, tell your doctor promptly. Remember that your doctor has prescribed this medication device because he or she has judged that the benefit to you is greater than the risk of side effects. Many people using this medication device do not have serious side effects. Tell your doctor immediately if any of these serious side effects occur: lack of menstrual period, unexplained fever, chills, trouble breathing, mental/mood changes (e.g., depression, nervousness), vaginal swelling/itching, painful intercourse. Tell your doctor immediately if any of these unlikely but serious side effects occur: migraine/severe headache, vomiting, tiredness, fast/pounding heartbeat. Tell your doctor immediately if any of these highly unlikely but very serious side effects occur: prolonged or heavy vaginal bleeding, unusual vaginal discharge/odor, vaginal sores, abdominal/pelvic pain or tenderness, lumps in the breast, yellowing eyes/skin, dark urine, persistent nausea, trouble urinating. A very serious allergic reaction to this drug is rare. However, seek immediate medical attention if you notice any of the following symptoms of a serious allergic reaction: rash, itching/swelling (especially of the face/tongue/throat), severe dizziness, trouble breathing. This is not a complete list of possible side effects. If you notice other effects not listed above, contact your doctor or pharmacist. In the [place] - Call your doctor for medical advice about side effects. You may report side effects to FDA at 1-800-FDA-1088. In [place] - Call your doctor for medical advice about side effects. You may report side effects to [facility] [place] at [phone].      PRECAUTIONS:  Before using this medication device, tell your doctor or pharmacist if you are allergic to levonorgestrel, or to any other progestins (e.g., norethindrone, desogestrel); or if you have any other allergies. This product may contain inactive ingredients, which can cause allergic reactions or other problems. Talk to your pharmacist for more details. This medication device should not be used if you have certain medical conditions. Before using this product, consult your doctor or pharmacist if you have: current known or suspected pregnancy, previous ectopic pregnancy, uterus problems (e.g., cancer, endometriosis, fibroids, pelvic inflammatory disease-PID), other IUD (intrauterine device) still in  place, vaginal problems (e.g., infection), breast cancer, liver disease/tumors, any condition that affects your immune system (e.g., AIDS, leukemia). Before using this product, tell your doctor your medical history, especially of: bleeding problems (e.g., menstrual changes, clotting problems), heart problems (e.g., congenital valve conditions), high blood pressure, migraine headaches, stroke, diabetes. If you have diabetes, this medication may make it harder to control your blood sugar levels. Monitor your blood sugar regularly as directed by your doctor. Tell your doctor the results and any symptoms such as increased thirst/urination. Your anti-diabetic medication or diet may need to be adjusted. This medication device may sometimes come out by itself or move out of place. This may result in unwanted pregnancy or other problems. After each menstrual period, check to make sure it is in the right place. Talk to your doctor about how to check your device. If it comes out or you cannot feel its threads, call your doctor promptly, and use a backup birth control method such as condoms. If you or partner has any other sexual partners, this medication device may no longer be a good choice for pregnancy prevention.  If you or your partner becomes HIV positive, or if you think you may have been exposed to any sexually transmitted disease, contact your doctor immediately. You should consider having this device removed. This medication device must not be used during pregnancy. If you become pregnant or think you may be pregnant, tell your doctor immediately. If you have just given birth and are not breast-feeding, or if you have had a pregnancy loss or abortion after the 3 months of pregnancy, wait at least 6 weeks (or as directed by your doctor) before using this medication device. Consult your doctor about the problems that may occur during pregnancy while using this product. Levonorgestrel passes into breast milk. Consult your doctor before breast-feeding.    DRUG INTERACTIONS:  Your doctor or pharmacist may already be aware of any possible drug interactions and may be monitoring you for them. Do not start, stop, or change the dosage of any medicine before checking with them first. Before using this medication device, tell your doctor of all prescription and nonprescription medications you may use, especially of: "blood thinners" (e.g., warfarin), birth control taken by mouth or applied to the skin (patch), certain drug used for varicose vein treatment (sodium tetradecyl sulfate), drugs that affect your immune response (e.g., corticosteroids such as prednisone). This document does not contain all possible interactions. Therefore, before using this product, tell your doctor or pharmacist of all the products you use. Keep a list of all your medications with you, and share the list with your doctor and pharmacist.    OVERDOSE:  Overdose with this medication is very unlikely because of the way the drug is released from this device. Consult your doctor or pharmacist for more information.    NOTES:  Do not share this medication with others. Keep all appointments with your doctor and the laboratory. You should have regular  complete physical exams including blood pressure, breast exam, pelvic exam, and screening for cervical cancer (Pap smear). Follow your doctor's instructions for examining your own breasts, and report any lumps immediately. Consult your doctor for more details.    MISSED DOSE:  Not applicable.    STORAGE:  Before use, store at room temperature at 77 degrees F (25 degrees C) away from light and moisture. Brief storage between 59-86 degrees F (15-30 degrees C) is permitted. Keep all medications and medical devices away from children  and pets. Do not flush medications down the toilet or pour them into a drain unless instructed to do so. Properly discard this product when it is expired or no longer needed. Consult your pharmacist or local waste disposal company for more details about how to safely discard your product.    Information last revised June 2010 Copyright(c) 2010 [employer].

## 2011-09-07 ENCOUNTER — Encounter: Payer: Self-pay | Admitting: *Deleted

## 2011-09-07 ENCOUNTER — Ambulatory Visit (INDEPENDENT_AMBULATORY_CARE_PROVIDER_SITE_OTHER): Payer: Federal, State, Local not specified - PPO | Admitting: Physician Assistant

## 2011-09-07 VITALS — BP 108/66 | HR 82 | Temp 98.6°F | Resp 16 | Ht 65.0 in | Wt 144.0 lb

## 2011-09-07 DIAGNOSIS — Z3009 Encounter for other general counseling and advice on contraception: Secondary | ICD-10-CM

## 2011-09-07 DIAGNOSIS — Z30430 Encounter for insertion of intrauterine contraceptive device: Secondary | ICD-10-CM

## 2011-09-07 NOTE — Progress Notes (Signed)
Blood pressure 108/66, pulse 82, temperature 98.6 F (37 C), temperature source Oral, resp. rate 16, height 5\' 5"  (1.651 m), weight 144 lb (65.318 kg), currently breastfeeding.  No complaints. Undesired fertility. Presents for Mirena IUD insert.    Patient identified, informed consent performed, signed copy in chart, time out was performed.  Urine pregnancy test negative.  Speculum placed in the vagina.  Cervix visualized.  Cleaned with Betadine x 2.  Grasped anteriourly with a single tooth tenaculum.  Uterus sounded to 7.5cm.  Mirena IUD placed per manufacturer's recommendations.  Strings trimmed to 1-2 cm.   Patient given post procedure instructions and Mirena care card with expiration date.  Patient is asked to check IUD strings periodically and follow up in 5-6 weeks for IUD check.   LOT # [id number] Exp: 4/15  Kendre Jacinto E. 2:32 PM 09/07/2011

## 2011-09-07 NOTE — Patient Instructions (Signed)
IMPORTANT: HOW TO USE THIS INFORMATION:  This is a summary and does NOT have all possible information about this product. This information does not assure that this product is safe, effective, or appropriate for you. This information is not individual medical advice and does not substitute for the advice of your health care professional. Always ask your health care professional for complete information about this product and your specific health needs.    LEVONORGESTREL-RELEASING IMPLANT - INTRAUTERINE (lee-voh-nor-JEST-rell)    COMMON BRAND NAME(S): Mirena    USES:  This product is a small, flexible device that is placed in the womb (uterus) to prevent pregnancy. It is used in women who desire reversible birth control that works for a long time (up to 5 years). The device works by slowly releasing a hormone (levonorgestrel) that is similar to a certain substance made by a woman's body. This product is only intended for women who have previously given birth and have only one sexual partner. It is not meant for women with a history of certain infections/conditions (e.g., pelvic inflammatory disease, sexually transmitted disease, a certain problem pregnancy called ectopic pregnancy). For more information, consult your doctor. The use of this medication device does not protect you or your partner against sexually transmitted diseases (e.g., HIV, gonorrhea). Carefully read all of the information provided by your doctor, and ask any questions you may have about this product or other birth control methods that may be right for you.    HOW TO USE:  Read the Patient Information Leaflet provided by your pharmacist before this medication device is inserted and each time it is re-inserted. The leaflet contains very important information about side effects and when it is important to call your doctor. If you have any questions, consult your doctor or pharmacist. This product is inserted into your uterus by a properly  trained health care professional, usually once every 5 years or as determined by your doctor. The medication in the device is slowly released into the body over a 5-year period. Have a follow-up appointment 4-12 weeks after insertion of this product to check that it is still correctly in place. If you still desire birth control after 5 years, the medication device may be replaced with a new one. The medication device may also be removed at any time by a properly trained health care professional. Learn all the instructions on how and when to check this product and its proper positioning in your body, and make sure you understand the problems that may occur with this product. See also Precautions section.    SIDE EFFECTS:  Irregular vaginal bleeding (e.g., spotting), cramps, headache, nausea, breast pain, acne, rash, hair loss, weight gain, or decreased interest in sex may occur. If any of these effects persist or worsen, tell your doctor promptly. Remember that your doctor has prescribed this medication device because he or she has judged that the benefit to you is greater than the risk of side effects. Many people using this medication device do not have serious side effects. Tell your doctor immediately if any of these serious side effects occur: lack of menstrual period, unexplained fever, chills, trouble breathing, mental/mood changes (e.g., depression, nervousness), vaginal swelling/itching, painful intercourse. Tell your doctor immediately if any of these unlikely but serious side effects occur: migraine/severe headache, vomiting, tiredness, fast/pounding heartbeat. Tell your doctor immediately if any of these highly unlikely but very serious side effects occur: prolonged or heavy vaginal bleeding, unusual vaginal discharge/odor, vaginal sores, abdominal/pelvic pain or   tenderness, lumps in the breast, yellowing eyes/skin, dark urine, persistent nausea, trouble urinating. A very serious allergic reaction to  this drug is rare. However, seek immediate medical attention if you notice any of the following symptoms of a serious allergic reaction: rash, itching/swelling (especially of the face/tongue/throat), severe dizziness, trouble breathing. This is not a complete list of possible side effects. If you notice other effects not listed above, contact your doctor or pharmacist. In the [place] - Call your doctor for medical advice about side effects. You may report side effects to FDA at 1-800-FDA-1088. In [place] - Call your doctor for medical advice about side effects. You may report side effects to [employer] [place] at 1-866-234-2345.    PRECAUTIONS:  Before using this medication device, tell your doctor or pharmacist if you are allergic to levonorgestrel, or to any other progestins (e.g., norethindrone, desogestrel); or if you have any other allergies. This product may contain inactive ingredients, which can cause allergic reactions or other problems. Talk to your pharmacist for more details. This medication device should not be used if you have certain medical conditions. Before using this product, consult your doctor or pharmacist if you have: current known or suspected pregnancy, previous ectopic pregnancy, uterus problems (e.g., cancer, endometriosis, fibroids, pelvic inflammatory disease-PID), other IUD (intrauterine device) still in place, vaginal problems (e.g., infection), breast cancer, liver disease/tumors, any condition that affects your immune system (e.g., AIDS, leukemia). Before using this product, tell your doctor your medical history, especially of: bleeding problems (e.g., menstrual changes, clotting problems), heart problems (e.g., congenital valve conditions), high blood pressure, migraine headaches, stroke, diabetes. If you have diabetes, this medication may make it harder to control your blood sugar levels. Monitor your blood sugar regularly as directed by your doctor. Tell your doctor the results and any  symptoms such as increased thirst/urination. Your anti-diabetic medication or diet may need to be adjusted. This medication device may sometimes come out by itself or move out of place. This may result in unwanted pregnancy or other problems. After each menstrual period, check to make sure it is in the right place. Talk to your doctor about how to check your device. If it comes out or you cannot feel its threads, call your doctor promptly, and use a backup birth control method such as condoms. If you or partner has any other sexual partners, this medication device may no longer be a good choice for pregnancy prevention. If you or your partner becomes HIV positive, or if you think you may have been exposed to any sexually transmitted disease, contact your doctor immediately. You should consider having this device removed. This medication device must not be used during pregnancy. If you become pregnant or think you may be pregnant, tell your doctor immediately. If you have just given birth and are not breast-feeding, or if you have had a pregnancy loss or abortion after the 3 months of pregnancy, wait at least 6 weeks (or as directed by your doctor) before using this medication device. Consult your doctor about the problems that may occur during pregnancy while using this product. Levonorgestrel passes into breast milk. Consult your doctor before breast-feeding.    DRUG INTERACTIONS:  Your doctor or pharmacist may already be aware of any possible drug interactions and may be monitoring you for them. Do not start, stop, or change the dosage of any medicine before checking with them first. Before using this medication device, tell your doctor of all prescription and nonprescription medications you may use, especially   of: "blood thinners" (e.g., warfarin), birth control taken by mouth or applied to the skin (patch), certain drug used for varicose vein treatment (sodium tetradecyl sulfate), drugs that affect your immune  response (e.g., corticosteroids such as prednisone). This document does not contain all possible interactions. Therefore, before using this product, tell your doctor or pharmacist of all the products you use. Keep a list of all your medications with you, and share the list with your doctor and pharmacist.    OVERDOSE:  Overdose with this medication is very unlikely because of the way the drug is released from this device. Consult your doctor or pharmacist for more information.    NOTES:  Do not share this medication with others. Keep all appointments with your doctor and the laboratory. You should have regular complete physical exams including blood pressure, breast exam, pelvic exam, and screening for cervical cancer (Pap smear). Follow your doctor's instructions for examining your own breasts, and report any lumps immediately. Consult your doctor for more details.    MISSED DOSE:  Not applicable.    STORAGE:  Before use, store at room temperature at 77 degrees F (25 degrees C) away from light and moisture. Brief storage between 59-86 degrees F (15-30 degrees C) is permitted. Keep all medications and medical devices away from children and pets. Do not flush medications down the toilet or pour them into a drain unless instructed to do so. Properly discard this product when it is expired or no longer needed. Consult your pharmacist or local waste disposal company for more details about how to safely discard your product.    Information last revised June 2010 Copyright(c) 2010 [employer]      

## 2011-10-06 ENCOUNTER — Encounter: Payer: Self-pay | Admitting: Obstetrics & Gynecology

## 2011-10-06 ENCOUNTER — Ambulatory Visit (INDEPENDENT_AMBULATORY_CARE_PROVIDER_SITE_OTHER): Payer: Federal, State, Local not specified - PPO | Admitting: Obstetrics & Gynecology

## 2011-10-06 VITALS — BP 120/63 | HR 76 | Temp 98.5°F | Resp 16 | Ht 65.0 in | Wt 139.0 lb

## 2011-10-06 DIAGNOSIS — Z23 Encounter for immunization: Secondary | ICD-10-CM

## 2011-10-06 DIAGNOSIS — Z309 Encounter for contraceptive management, unspecified: Secondary | ICD-10-CM

## 2011-10-06 DIAGNOSIS — Z30431 Encounter for routine checking of intrauterine contraceptive device: Secondary | ICD-10-CM

## 2011-10-06 NOTE — Progress Notes (Signed)
  Subjective:    Patient ID: Dawn [name], female    DOB: 1981-12-05, 30 y.o.   MRN: [id number]  HPI  Pt s/p mirena insertion Sept 24th.  Pt feels strings are longer.  Pt having spotting.    Review of Systems  Constitutional: Negative.   Respiratory: Negative.   Cardiovascular: Negative.   Genitourinary: Positive for vaginal bleeding.       Objective:   Physical Exam  Constitutional: She appears well-developed and well-nourished. No distress.  HENT:  Head: Normocephalic and atraumatic.  Eyes: Conjunctivae are normal.  Pulmonary/Chest: Effort normal.  Abdominal: Soft. She exhibits no distension and no mass. There is no tenderness. There is no rebound and no guarding.  Genitourinary: Vagina normal and uterus normal. No vaginal discharge found.       Strings appear 4 cm.    Musculoskeletal: Normal range of motion.  Skin: Skin is warm and dry.  Psychiatric: She has a normal mood and affect.          Assessment & Plan:  Bedside US:  Mirena still appears at fundus.  1.  Strings cut to 2 cm.  Pt reassured.

## 2011-10-21 ENCOUNTER — Ambulatory Visit: Payer: Federal, State, Local not specified - PPO | Admitting: Obstetrics & Gynecology

## 2012-07-05 ENCOUNTER — Ambulatory Visit: Payer: Federal, State, Local not specified - PPO | Admitting: Obstetrics & Gynecology

## 2012-07-25 ENCOUNTER — Ambulatory Visit (INDEPENDENT_AMBULATORY_CARE_PROVIDER_SITE_OTHER): Payer: Federal, State, Local not specified - PPO | Admitting: Family

## 2012-07-25 ENCOUNTER — Encounter: Payer: Self-pay | Admitting: Family

## 2012-07-25 VITALS — BP 120/79 | HR 91 | Ht 65.0 in | Wt 138.0 lb

## 2012-07-25 DIAGNOSIS — Z1151 Encounter for screening for human papillomavirus (HPV): Secondary | ICD-10-CM

## 2012-07-25 DIAGNOSIS — Z113 Encounter for screening for infections with a predominantly sexual mode of transmission: Secondary | ICD-10-CM

## 2012-07-25 DIAGNOSIS — Z124 Encounter for screening for malignant neoplasm of cervix: Secondary | ICD-10-CM

## 2012-07-25 DIAGNOSIS — E049 Nontoxic goiter, unspecified: Secondary | ICD-10-CM

## 2012-07-25 DIAGNOSIS — Z01419 Encounter for gynecological examination (general) (routine) without abnormal findings: Secondary | ICD-10-CM

## 2012-07-25 DIAGNOSIS — Z0000 Encounter for general adult medical examination without abnormal findings: Secondary | ICD-10-CM

## 2012-07-25 NOTE — Progress Notes (Signed)
Here for yearly physical and pap/ would like her cholesterol level checked.

## 2012-07-25 NOTE — Progress Notes (Signed)
  Subjective:     Dawn [name] is a 31 y.o. female here for a routine exam.  Current complaints: small hard lesion on right labia x 2 days, slightly painful.  Using Mirena for family planning, doing well.  Personal health questionnaire reviewed: yes.   Gynecologic History Patient's last menstrual period was 07/18/2012. Contraception: IUD Last Pap: January 2012. Results were: normal Last mammogram: n/a.   Obstetric History OB History    Grav Para Term Preterm Abortions TAB SAB Ect Mult Living   1 1 1       1      # Outc Date GA Lbr Len/2nd Wgt Sex Del Anes PTL Lv   1 TRM 8/12 [redacted]w[redacted]d 06:19 / 01:14 6lb15oz(3.147kg) F SVD None  Yes   Comments: wnl       The following portions of the patient's history were reviewed and updated as appropriate: allergies, current medications, past family history, past medical history, past social history, past surgical history and problem list.  Review of Systems Pertinent items are noted in HPI.    Objective:   Filed Vitals:   07/25/12 0835  BP: 120/79  Pulse: 91   General appearance: alert, cooperative and appears stated age Head: Normocephalic, without obvious abnormality, atraumatic Neck: no adenopathy, no carotid bruit, no JVD, supple, symmetrical, trachea midline and thyroid not enlarged, symmetric, no tenderness/mass/nodules Lungs: clear to auscultation bilaterally Breasts: normal appearance, no masses or tenderness, No nipple retraction or dimpling, No nipple discharge or bleeding, No axillary or supraclavicular adenopathy, Normal to palpation without dominant masses, Taught monthly breast self examination Heart: regular rate and rhythm, S1, S2 normal, no murmur, click, rub or gallop Abdomen: soft, non-tender; bowel sounds normal; no masses,  no organomegaly Pelvic: cervix normal in appearance, external genitalia normal with exception of small sebaceous cyst noted on right labia, no adnexal masses or tenderness, no cervical motion  tenderness, rectovaginal septum normal, uterus normal size, shape, and consistency and vagina normal without discharge Skin: Skin color, texture, turgor normal. No rashes or lesions     Assessment:    Healthy female exam.  Vaginal Sebaceous Cyst   Plan:    Contraception: IUD.   Thin prep to lab Cholesterol and Hgb A1C per pt request If sebaceous cyst not improved in two weeks return for possible drainage. Referral to Dr. Benjamin Stain for knee pain. [facility]

## 2012-07-28 ENCOUNTER — Encounter: Payer: Self-pay | Admitting: Family

## 2012-07-28 DIAGNOSIS — Z01419 Encounter for gynecological examination (general) (routine) without abnormal findings: Secondary | ICD-10-CM | POA: Insufficient documentation

## 2012-10-20 ENCOUNTER — Encounter: Payer: Self-pay | Admitting: Obstetrics & Gynecology

## 2012-10-20 ENCOUNTER — Ambulatory Visit (INDEPENDENT_AMBULATORY_CARE_PROVIDER_SITE_OTHER): Payer: Federal, State, Local not specified - PPO | Admitting: Obstetrics & Gynecology

## 2012-10-20 VITALS — BP 134/75 | HR 83 | Temp 97.1°F | Resp 16 | Ht 65.0 in | Wt 139.0 lb

## 2012-10-20 DIAGNOSIS — Z23 Encounter for immunization: Secondary | ICD-10-CM

## 2012-10-20 DIAGNOSIS — N926 Irregular menstruation, unspecified: Secondary | ICD-10-CM

## 2012-10-20 DIAGNOSIS — R232 Flushing: Secondary | ICD-10-CM

## 2012-10-20 DIAGNOSIS — N951 Menopausal and female climacteric states: Secondary | ICD-10-CM

## 2012-10-20 NOTE — Progress Notes (Signed)
  Subjective:    Patient ID: Dawn [name], female    DOB: Jun 13, 1981, 31 y.o.   MRN: [id number]  HPI  31 yo P1 with a Mirena who is here because she wanted to make sure she isn't pregnant. She reports feeling some hot flashes, having irregular bleeding (2 periods per month) and just generally not feeling well. Review of Systems  She finished breastfeeding/pumping 6/13.    Objective:   Physical Exam  IUD strings seen  UPT negative       Assessment & Plan:   Irregular bleeding with Mirena- I have recommended that she use OCPs prn heavy bleeding and IBU prn also. Per her request, I will check a TSH, FSH Flu vaccine today

## 2012-10-21 LAB — FOLLICLE STIMULATING HORMONE: FSH: 7.6 m[IU]/mL

## 2013-01-15 ENCOUNTER — Emergency Department
Admission: EM | Admit: 2013-01-15 | Discharge: 2013-01-15 | Disposition: A | Discharge status: Home / Self Care | Payer: Federal, State, Local not specified - PPO | Source: Home / Self Care | Attending: Emergency Medicine | Admitting: Emergency Medicine

## 2013-01-15 DIAGNOSIS — J069 Acute upper respiratory infection, unspecified: Secondary | ICD-10-CM

## 2013-01-15 DIAGNOSIS — J029 Acute pharyngitis, unspecified: Secondary | ICD-10-CM

## 2013-01-15 LAB — POCT INFLUENZA A/B
Influenza A, POC: NEGATIVE
Influenza B, POC: NEGATIVE

## 2013-01-15 LAB — POCT RAPID STREP A (OFFICE): Rapid Strep A Screen: NEGATIVE

## 2013-01-15 MED ORDER — AZITHROMYCIN 250 MG PO TABS: ORAL_TABLET | ORAL | Status: DC

## 2013-01-15 NOTE — ED Notes (Signed)
Anatasia complains of sore throat and body aches for 1 day. Her temperature today is 99.3, otherwise denies fever, chills or sweats. She also complains of congestion, dry cough, stomach pain and nausea.

## 2013-01-15 NOTE — ED Provider Notes (Signed)
History    URI HISTORY  Dawn [name] is a 32 y.o. female who complains of onset of sore throat and cold symptoms for one day.  Have been using over-the-counter treatment which helps a little bit. Her 29-month-old daughter is being treated for an ear infection.  No chills/sweats + Minimal low-grade Fever  +  Nasal congestion +  Minimal Discolored Post-nasal drainage No sinus pain/pressure  positive sore throat  Mild, dry, nonproductive cough No wheezing No chest congestion No hemoptysis No shortness of breath No pleuritic pain  No itchy/red eyes No earache  Had minimal nausea, not currently. No vomiting No abdominal pain No diarrhea  No skin rashes +  Fatigue Has mild myalgias No headache   CSN: [id number]  Arrival date & time 01/15/13  1132   First MD Initiated Contact with Patient 01/15/13 1218      Chief Complaint  Patient presents with  . Sore Throat    x 1 day  . Generalized Body Aches    x 1 day    (Consider location/radiation/quality/duration/timing/severity/associated sxs/prior treatment) HPI  Past Medical History  Diagnosis Date  . Thyroid goiter     benign    Past Surgical History  Procedure Date  . Tonsillectomy     Family History  Problem Relation Age of Onset  . Birth defects Maternal Grandmother   . Cancer Maternal Grandmother     Ovarian  . Birth defects Maternal Grandfather   . Cancer Maternal Grandfather   . Breast cancer Maternal Aunt 42  . Breast cancer Maternal Aunt 50  . Hyperlipidemia Father   . Hypertension Sister     History  Substance Use Topics  . Smoking status: Never Smoker   . Smokeless tobacco: Never Used  . Alcohol Use: No    OB History    Grav Para Term Preterm Abortions TAB SAB Ect Mult Living   1 1 1       1       Review of Systems  All other systems reviewed and are negative.   please see above review of systems as in history of present illness  Allergies  Latex  Home Medications   Current  Outpatient Rx  Name  Route  Sig  Dispense  Refill  . MULTIVITAMINS PO CAPS   Oral   Take 1 capsule by mouth daily.         . AZITHROMYCIN 250 MG PO TABS      Use as directed   1 each   0     BP 110/76  Pulse 90  Temp 99.3 F (37.4 C) (Oral)  Resp 16  Ht 5\' 5"  (1.651 m)  Wt 140 lb (63.504 kg)  BMI 23.30 kg/m2  SpO2 100%  LMP 12/28/2012  Physical Exam  Nursing note and vitals reviewed. Constitutional: She is oriented to person, place, and time. She appears well-developed and well-nourished. She is cooperative.  Non-toxic appearance. No distress.  HENT:  Head: Normocephalic and atraumatic.  Right Ear: Tympanic membrane, external ear and ear canal normal.  Left Ear: Tympanic membrane, external ear and ear canal normal.  Nose: Mucosal edema (minimal) and rhinorrhea (Minimal clear rhinorrhea) present. Right sinus exhibits no maxillary sinus tenderness and no frontal sinus tenderness. Left sinus exhibits no maxillary sinus tenderness and no frontal sinus tenderness.  Mouth/Throat: Mucous membranes are normal. Posterior oropharyngeal erythema present. No oropharyngeal exudate or posterior oropharyngeal edema.  Eyes: Conjunctivae normal are normal. No scleral icterus.  Neck: Neck supple.  Cardiovascular:  Normal rate, regular rhythm and normal heart sounds.   No murmur heard. Pulmonary/Chest: Effort normal and breath sounds normal. No stridor. No respiratory distress. She has no wheezes. She has no rales.  Abdominal: Soft. There is no tenderness.  Musculoskeletal: She exhibits no edema.  Lymphadenopathy:    She has cervical adenopathy.       Right cervical: Superficial cervical adenopathy present. No deep cervical and no posterior cervical adenopathy present.      Left cervical: Superficial cervical adenopathy present. No deep cervical and no posterior cervical adenopathy present.  Neurological: She is alert and oriented to person, place, and time.  Skin: Skin is warm and dry.   Psychiatric: She has a normal mood and affect.    ED Course  Procedures (including critical care time)   Labs Reviewed  POCT RAPID STREP A (OFFICE) - Normal  POCT INFLUENZA A/B - Normal   No results found.   1. Sore throat   2. URI, acute       MDM  Rapid strep test negative. Rapid Influenza tests today ,A and B, both negative today. Discussed with patient that she likely has viral pharyngitis/URI. No evidence of bacterial infection. After risks, benefits, alternatives discussed, she agrees with the following plans: OTC symptomatic care for fever and pain when necessary. OTC symptomatic care for nasal congestion and minimal cough. I gave her a prescription for Zithromax Z-Pak, to hold onto. Advised not to fill this unless she's not improving in 3-4 days, or if she develops bacterial URI symptoms. Red flags discussed. Followup with PCP if no better in a week, sooner if worse or new symptoms.  She voiced understanding and agreement with above plans        Lajean Manes, MD 01/15/13 1322

## 2013-05-10 ENCOUNTER — Ambulatory Visit (INDEPENDENT_AMBULATORY_CARE_PROVIDER_SITE_OTHER): Payer: Federal, State, Local not specified - PPO

## 2013-05-10 ENCOUNTER — Encounter: Payer: Self-pay | Admitting: Physician Assistant

## 2013-05-10 ENCOUNTER — Ambulatory Visit (INDEPENDENT_AMBULATORY_CARE_PROVIDER_SITE_OTHER): Payer: Federal, State, Local not specified - PPO | Admitting: Physician Assistant

## 2013-05-10 VITALS — BP 114/73 | HR 89 | Wt 129.0 lb

## 2013-05-10 DIAGNOSIS — R29898 Other symptoms and signs involving the musculoskeletal system: Secondary | ICD-10-CM

## 2013-05-10 DIAGNOSIS — M7711 Lateral epicondylitis, right elbow: Secondary | ICD-10-CM

## 2013-05-10 DIAGNOSIS — M7710 Lateral epicondylitis, unspecified elbow: Secondary | ICD-10-CM

## 2013-05-10 DIAGNOSIS — M238X1 Other internal derangements of right knee: Secondary | ICD-10-CM

## 2013-05-10 DIAGNOSIS — S73004S Unspecified dislocation of right hip, sequela: Secondary | ICD-10-CM

## 2013-05-10 NOTE — Patient Instructions (Addendum)
Take ibuprofen up to 800mg  up to three times a day.   Hip Dislocation Dislocation is an injury to a joint, in which two adjoining bones shift out of alignment and are no longer touching. Dislocation of the hip joint (between the thigh bone and the pelvic bones) is a serious injury. It is common for a fracture to also occur when the hip is dislocated. Other injuries may include damage to soft tissues (cartilage, tendons, or ligaments), bone damage, damage to blood vessels, or nerve injury. SYMPTOMS   Severe pain at the time of injury.  Pain when moving the hip.  Loss of hip function.  Inability to stand on affected leg.  Visible deformity, if the bones have locked in the dislocated position.  Tenderness, swelling, and bruising of the hip.  Numbness or paralysis below the dislocation, from pinching, cutting, or pressure on the blood vessels or nerves. CAUSES  Hip dislocations are usually the result of a direct hit (trauma) to the hip or knee. Some people may be predisposed to hip dislocations because of a birth defect (congenital abnormality), such as shallow or malformed joint surfaces.  RISK INCREASES WITH:  Contact sports (i.e. football, hockey).  Previous hip injury.  Poor hip strength and flexibility. PREVENTION  Warm up and stretch properly before activity.  Maintain physical fitness:  Hip strength.  Flexibility and endurance.  Cardiovascular fitness.  After healing, protect hip with special hip pads.  Consider avoiding contact sports, if treatment is not successful in restoring a strong, stable hip. PROGNOSIS  If treated properly, healing can be expected to occur within 3 months. RELATED COMPLICATIONS   Related fracture or soft tissue injury.  Damage to nerves.  Damage to blood vessels, which may lead to an interrupted blood supply. This can cause death of the bone in the ball of the hip. It may not be apparent for up to 2 years following the  injury.  Prolonged healing or recurring dislocation, if activity is resumed too soon.  Excessive bleeding within the hip.  Repeated hip dislocations (rare).  Unstable or arthritic joint, after repeated injury. TREATMENT  Hip dislocations require immediate realigning of the bones (reduction). Realignment should be performed by a medically trained person. For some cases, surgery is required to realign the hip joint or to remove loose fragments (bone or cartilage). If the dislocation caused a fracture of the hip socket (acetabulum), surgery may be required to place pins and screws, to hold the bones together. After realignment or surgery, a patient must be partial or non-weight bearing on the affected hip for up to 6 weeks. This may include the use of crutches. A cast may be recommended to restrain the joint. However, this is uncommon. After the joint has been rested and allowed to heal, stretching and strengthening exercises for the injured and weakened joint and muscles are needed. Exercises may be performed at home or with a therapist. MEDICATION   If pain medicine is needed, nonsteroidal anti-inflammatory medicines (aspirin and ibuprofen), or other minor pain relievers (acetaminophen), are often advised.  Do not take pain medicine for 7 days before surgery.  Prescription pain relievers are usually prescribed only after surgery. Use only as directed and only as much as you need. HEAT AND COLD  Cold treatment (icing) should be applied for 10 to 15 minutes every 2 to 3 hours for inflammation and pain, and immediately after activity that aggravates your symptoms. Use ice packs or an ice massage.  Heat treatment may be used  before performing stretching and strengthening activities prescribed by your caregiver, physical therapist, or athletic trainer. Use a heat pack or a warm water soak. SEEK MEDICAL CARE IF:   Pain, tenderness, or swelling gets worse, despite treatment.  You experience pain,  numbness, or coldness in the foot.  Blue, gray, or dark color appears in the toenails.  Any of the following occur after surgery:  Signs of infection: fever, increased pain, swelling, redness, drainage of fluids, or bleeding in the affected area.  New, unexplained symptoms develop. (Drugs used in treatment may produce side effects.) Document Released: 11/30/2005 Document Revised: 02/22/2012 Document Reviewed: 03/14/2009 [facility] Patient Information 2014 [place], [place].   Lateral Epicondylitis (Tennis Elbow) with Rehab Lateral epicondylitis involves inflammation and pain around the outer portion of the elbow. The pain is caused by inflammation of the tendons in the forearm that bring back (extend) the wrist. Lateral epicondylittis is also called tennis elbow, because it is very common in tennis players. However, it may occur in any individual who extends the wrist repetitively. If lateral epicondylitis is left untreated, it may become a chronic problem. SYMPTOMS   Pain, tenderness, and inflammation on the outer (lateral) side of the elbow.  Pain or weakness with gripping activities.  Pain that increases with wrist twisting motions (playing tennis, using a screwdriver, opening a door or a jar).  Pain with lifting objects, including a coffee cup. CAUSES  Lateral epicondylitis is caused by inflammation of the tendons that extend the wrist. Causes of injury may include:  Repetitive stress and strain on the muscles and tendons that extend the wrist.  Sudden change in activity level or intensity.  Incorrect grip in racquet sports.  Incorrect grip size of racquet (often too large).  Incorrect hitting position or technique (usually backhand, leading with the elbow).  Using a racket that is too heavy. RISK INCREASES WITH:  Sports or occupations that require repetitive and/or strenuous forearm and wrist movements (tennis, squash, racquetball, carpentry).  Poor wrist and forearm  strength and flexibility.  Failure to warm up properly before activity.  Resuming activity before healing, rehabilitation, and conditioning are complete. PREVENTION   Warm up and stretch properly before activity.  Maintain physical fitness:  Strength, flexibility, and endurance.  Cardiovascular fitness.  Wear and use properly fitted equipment.  Learn and use proper technique and have a coach correct improper technique.  Wear a tennis elbow (counterforce) brace. PROGNOSIS  The course of this condition depends on the degree of the injury. If treated properly, acute cases (symptoms lasting less than 4 weeks) are often resolved in 2 to 6 weeks. Chronic (longer lasting cases) often resolve in 3 to 6 months, but may require physical therapy. RELATED COMPLICATIONS   Frequently recurring symptoms, resulting in a chronic problem. Properly treating the problem the first time decreases frequency of recurrence.  Chronic inflammation, scarring tendon degeneration, and partial tendon tear, requiring surgery.  Delayed healing or resolution of symptoms. TREATMENT  Treatment first involves the use of ice and medicine, to reduce pain and inflammation. Strengthening and stretching exercises may help reduce discomfort, if performed regularly. These exercises may be performed at home, if the condition is an acute injury. Chronic cases may require a referral to a physical therapist for evaluation and treatment. Your caregiver may advise a corticosteroid injection, to help reduce inflammation. Rarely, surgery is needed. MEDICATION  If pain medicine is needed, nonsteroidal anti-inflammatory medicines (aspirin and ibuprofen), or other minor pain relievers (acetaminophen), are often advised.  Do not  take pain medicine for 7 days before surgery.  Prescription pain relievers may be given, if your caregiver thinks they are needed. Use only as directed and only as much as you need.  Corticosteroid  injections may be recommended. These injections should be reserved only for the most severe cases, because they can only be given a certain number of times. HEAT AND COLD  Cold treatment (icing) should be applied for 10 to 15 minutes every 2 to 3 hours for inflammation and pain, and immediately after activity that aggravates your symptoms. Use ice packs or an ice massage.  Heat treatment may be used before performing stretching and strengthening activities prescribed by your caregiver, physical therapist, or athletic trainer. Use a heat pack or a warm water soak. SEEK MEDICAL CARE IF: Symptoms get worse or do not improve in 2 weeks, despite treatment. EXERCISES  RANGE OF MOTION (ROM) AND STRETCHING EXERCISES - Epicondylitis, Lateral (Tennis Elbow) These exercises may help you when beginning to rehabilitate your injury. Your symptoms may go away with or without further involvement from your physician, physical therapist or athletic trainer. While completing these exercises, remember:   Restoring tissue flexibility helps normal motion to return to the joints. This allows healthier, less painful movement and activity.  An effective stretch should be held for at least 30 seconds.  A stretch should never be painful. You should only feel a gentle lengthening or release in the stretched tissue. RANGE OF MOTION  Wrist Flexion, Active-Assisted  Extend your right / left elbow with your fingers pointing down.*  Gently pull the back of your hand towards you, until you feel a gentle stretch on the top of your forearm.  Hold this position for __________ seconds. Repeat __________ times. Complete this exercise __________ times per day.  *If directed by your physician, physical therapist or athletic trainer, complete this stretch with your elbow bent, rather than extended. RANGE OF MOTION  Wrist Extension, Active-Assisted  Extend your right / left elbow and turn your palm upwards.*  Gently pull your  palm and fingertips back, so your wrist extends and your fingers point more toward the ground.  You should feel a gentle stretch on the inside of your forearm.  Hold this position for __________ seconds. Repeat __________ times. Complete this exercise __________ times per day. *If directed by your physician, physical therapist or athletic trainer, complete this stretch with your elbow bent, rather than extended. STRETCH - Wrist Flexion  Place the back of your right / left hand on a tabletop, leaving your elbow slightly bent. Your fingers should point away from your body.  Gently press the back of your hand down onto the table by straightening your elbow. You should feel a stretch on the top of your forearm.  Hold this position for __________ seconds. Repeat __________ times. Complete this stretch __________ times per day.  STRETCH  Wrist Extension   Place your right / left fingertips on a tabletop, leaving your elbow slightly bent. Your fingers should point backwards.  Gently press your fingers and palm down onto the table by straightening your elbow. You should feel a stretch on the inside of your forearm.  Hold this position for __________ seconds. Repeat __________ times. Complete this stretch __________ times per day.  STRENGTHENING EXERCISES - Epicondylitis, Lateral (Tennis Elbow) These exercises may help you when beginning to rehabilitate your injury. They may resolve your symptoms with or without further involvement from your physician, physical therapist or athletic trainer. While completing these  exercises, remember:   Muscles can gain both the endurance and the strength needed for everyday activities through controlled exercises.  Complete these exercises as instructed by your physician, physical therapist or athletic trainer. Increase the resistance and repetitions only as guided.  You may experience muscle soreness or fatigue, but the pain or discomfort you are trying to  eliminate should never worsen during these exercises. If this pain does get worse, stop and make sure you are following the directions exactly. If the pain is still present after adjustments, discontinue the exercise until you can discuss the trouble with your caregiver. STRENGTH Wrist Flexors  Sit with your right / left forearm palm-up and fully supported on a table or countertop. Your elbow should be resting below the height of your shoulder. Allow your wrist to extend over the edge of the surface.  Loosely holding a __________ weight, or a piece of rubber exercise band or tubing, slowly curl your hand up toward your forearm.  Hold this position for __________ seconds. Slowly lower the wrist back to the starting position in a controlled manner. Repeat __________ times. Complete this exercise __________ times per day.  STRENGTH  Wrist Extensors  Sit with your right / left forearm palm-down and fully supported on a table or countertop. Your elbow should be resting below the height of your shoulder. Allow your wrist to extend over the edge of the surface.  Loosely holding a __________ weight, or a piece of rubber exercise band or tubing, slowly curl your hand up toward your forearm.  Hold this position for __________ seconds. Slowly lower the wrist back to the starting position in a controlled manner. Repeat __________ times. Complete this exercise __________ times per day.  STRENGTH - Ulnar Deviators  Stand with a ____________________ weight in your right / left hand, or sit while holding a rubber exercise band or tubing, with your healthy arm supported on a table or countertop.  Move your wrist, so that your pinkie travels toward your forearm and your thumb moves away from your forearm.  Hold this position for __________ seconds and then slowly lower the wrist back to the starting position. Repeat __________ times. Complete this exercise __________ times per day STRENGTH - Radial  Deviators  Stand with a ____________________ weight in your right / left hand, or sit while holding a rubber exercise band or tubing, with your injured arm supported on a table or countertop.  Raise your hand upward in front of you or pull up on the rubber tubing.  Hold this position for __________ seconds and then slowly lower the wrist back to the starting position. Repeat __________ times. Complete this exercise __________ times per day. STRENGTH  Forearm Supinators   Sit with your right / left forearm supported on a table, keeping your elbow below shoulder height. Rest your hand over the edge, palm down.  Gently grip a hammer or a soup ladle.  Without moving your elbow, slowly turn your palm and hand upward to a "thumbs-up" position.  Hold this position for __________ seconds. Slowly return to the starting position. Repeat __________ times. Complete this exercise __________ times per day.  STRENGTH  Forearm Pronators   Sit with your right / left forearm supported on a table, keeping your elbow below shoulder height. Rest your hand over the edge, palm up.  Gently grip a hammer or a soup ladle.  Without moving your elbow, slowly turn your palm and hand upward to a "thumbs-up" position.  Hold this  position for __________ seconds. Slowly return to the starting position. Repeat __________ times. Complete this exercise __________ times per day.  STRENGTH - Grip  Grasp a tennis ball, a dense sponge, or a large, rolled sock in your hand.  Squeeze as hard as you can, without increasing any pain.  Hold this position for __________ seconds. Release your grip slowly. Repeat __________ times. Complete this exercise __________ times per day.  STRENGTH - Elbow Extensors, Isometric  Stand or sit upright, on a firm surface. Place your right / left arm so that your palm faces your stomach, and it is at the height of your waist.  Place your opposite hand on the underside of your forearm.  Gently push up as your right / left arm resists. Push as hard as you can with both arms, without causing any pain or movement at your right / left elbow. Hold this stationary position for __________ seconds. Gradually release the tension in both arms. Allow your muscles to relax completely before repeating. Document Released: 11/30/2005 Document Revised: 02/22/2012 Document Reviewed: 03/14/2009 [facility] Patient Information 2014 [place], [place].

## 2013-05-12 NOTE — Progress Notes (Signed)
Subjective:    Patient ID: Dawn [name], female    DOB: March 05, 1981, 33 y.o.   MRN: [id number]  HPI Patient is a 32 year old female who presents to the clinic to establish care. 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 She uses the rate of her birth control other than that not on any other medications. Last Pap smear she does not remember the date but feels like it was in the last 3 years. She has a family history of breast, cervical and liver cancer. Patient does not smoke and only drinks a couple times a week.  Patient is having a lot of problems with her joint. Most concerning pain is her right elbow. The discomfort and pain started about 2 weeks ago. She is taking ibuprofen occasionally and it does help. She denies any sports. 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 She has never had anything like this before.  She also has some right knee grinding. She denies any pain associated. Anytime she walks up the steps or extends her knees at all she hears cracking. She has not done anything to make better or worse.  She is also having a lot of hip popping and pain. This has been occurring since the birth of her 24-year-old. She feels like her hip on this comes out of joint sometimes and she has to pop it back in to place. She's not had anything to make better or worse. As long as the hip is in place there is no pain associated. She does not note any specific movements that make her hip pop out of place. Sometimes this can happen even when she stands up to walk. It has never gotten stuck out of place.   Review of Systems  All other systems reviewed and are negative.       Objective:   Physical Exam  Constitutional: She appears well-developed and well-nourished.  HENT:  Head: Normocephalic and atraumatic.  Eyes: Conjunctivae are normal.  Cardiovascular: Normal rate, regular rhythm and normal heart sounds.    Pulmonary/Chest: Effort normal and breath sounds normal.  Musculoskeletal:  Right knee 2+ crepitus. Strength 5 out of 5. No joint tenderness with palpation. Negative anterior drawer.  Tenderness to pinpoint palpation over lateral epicondylitis. Normal range of motion of right elbow. No evidence of swelling or bruising. Strength 5 out of 5.  Bilateral hips-full range of motion without pain. No tenderness over the hip bursa bilaterally. Gait normal.  Skin: Skin is warm and dry.  Psychiatric: She has a normal mood and affect. Her behavior is normal.          Assessment & Plan:  Lateral epicondylitis-placed in arm band today. Gave handout with exercises. Discussed use of anti-inflammatories. Encouraged heat and ice alternating. If not improving in the next 2-4 weeks please followup in office. Discussed any repetitive motions to rest as much as possible.  Right knee crepitus-we'll get x-rays today of right knee. Discusses there was no pain treatment is very limited. Discuss strengthening her right knee with exercises. Patient has had bone density that was normal the last 5 years. Encouraged patient to have at least 4 servings of dairy a day for bone health.  Bilateral hip popping out of place-discuss working out and strengthening muscles and tendons around hip to give them were stripped and possibly decrease the episodes of laxity. If home exercises are not working to try formal physical  therapy. Will send him female copy of the strengthening exercises.

## 2013-06-19 ENCOUNTER — Encounter: Payer: Self-pay | Admitting: Physician Assistant

## 2013-06-19 ENCOUNTER — Ambulatory Visit (INDEPENDENT_AMBULATORY_CARE_PROVIDER_SITE_OTHER): Payer: Federal, State, Local not specified - PPO | Admitting: Physician Assistant

## 2013-06-19 VITALS — BP 115/76 | HR 92 | Temp 98.3°F | Wt 130.0 lb

## 2013-06-19 DIAGNOSIS — H6692 Otitis media, unspecified, left ear: Secondary | ICD-10-CM

## 2013-06-19 DIAGNOSIS — N898 Other specified noninflammatory disorders of vagina: Secondary | ICD-10-CM

## 2013-06-19 DIAGNOSIS — H6690 Otitis media, unspecified, unspecified ear: Secondary | ICD-10-CM

## 2013-06-19 DIAGNOSIS — J209 Acute bronchitis, unspecified: Secondary | ICD-10-CM

## 2013-06-19 DIAGNOSIS — J019 Acute sinusitis, unspecified: Secondary | ICD-10-CM

## 2013-06-19 LAB — WET PREP FOR TRICH, YEAST, CLUE
Clue Cells Wet Prep HPF POC: NONE SEEN
Yeast Wet Prep HPF POC: NONE SEEN

## 2013-06-19 MED ORDER — BENZONATATE 100 MG PO CAPS: 100.0000 mg | ORAL_CAPSULE | Freq: Three times a day (TID) | ORAL | Status: DC | PRN

## 2013-06-19 MED ORDER — HYDROCODONE-HOMATROPINE 5-1.5 MG/5ML PO SYRP: 5.0000 mL | ORAL_SOLUTION | Freq: Every evening | ORAL | Status: DC | PRN

## 2013-06-19 MED ORDER — AMOXICILLIN-POT CLAVULANATE 875-125 MG PO TABS: 1.0000 | ORAL_TABLET | Freq: Two times a day (BID) | ORAL | Status: DC

## 2013-06-19 NOTE — Progress Notes (Signed)
  Subjective:    Patient ID: Dawn [name], female    DOB: 03-31-1981, 32 y.o.   MRN: [id number]  HPI Patient presents to the clinic with overall "feeling bad everywhere". Symptoms Wednesday before the fourth of July for about 6 days. She has continued to get worse. Her daughter started with cough and feeling tired. She went to doctor and stated she had a virus. Her husband is also feeling sick. She is concerned because she has started coughing up green thick mucus, very sore throat, left ear pain. Her ear pain has even brought tears to her eyes. She has sinus pressure. She has also felt nauseated but not vomiting. Negative home pregnancy. No fever. Stools normal and no blood. Does have some vaginal discharge but no urinary changes or pain. She has also noticed a red rash on back of left knee and abdomen does not itch but just there. She is losing her voice and has no energy.    Review of Systems     Objective:   Physical Exam  Constitutional: She appears well-nourished. She appears distressed.  HENT:  Head: Normocephalic and atraumatic.  Right Ear: External ear normal.  Left TM dull with pus and buldging TM. Lots of erythema. Pain with exam and pulling on ear.   Oropharynx red but not swollen and no exduate. Appears to have a lot of PND.   Sinus tender to palpation over maxillary sinsuses.   Nasal turbinates red and swollen with crusty sores bilaterally.   Eyes: Conjunctivae are normal. Right eye exhibits no discharge. Left eye exhibits no discharge.  Neck: Normal range of motion. Neck supple.  Cardiovascular: Normal rate, regular rhythm and normal heart sounds.   Pulmonary/Chest: Effort normal.  No wheezing but dry hacking cough on exam.  Abdominal: Soft. Bowel sounds are normal. There is no tenderness.  Lymphadenopathy:    She has no cervical adenopathy.  Skin:  Petechial rash behind left knee and on lower left abdomen and upper right abdomen.  Psychiatric: She has a normal  mood and affect. Her behavior is normal.          Assessment & Plan:  Sinusitis/left otitis media/bronchitis- I do suspect started as viral and has now developed bacterial infection. Augmentin was given for 10 days. Tessalon pearls given for during the day. Hycodan at night. Ibuprofen for pain. Discussed rash with pt if does not clear or continues to spread.   Vaginal discharge- UA positive for blood but no leuks or nitrates. Will culture. Got wet prep. Will call with results.

## 2013-06-21 LAB — URINE CULTURE: Colony Count: NO GROWTH

## 2013-07-06 ENCOUNTER — Encounter: Payer: Self-pay | Admitting: Sports Medicine

## 2013-07-06 ENCOUNTER — Telehealth: Payer: Self-pay | Admitting: *Deleted

## 2013-07-06 ENCOUNTER — Ambulatory Visit (INDEPENDENT_AMBULATORY_CARE_PROVIDER_SITE_OTHER): Payer: Federal, State, Local not specified - PPO | Admitting: Sports Medicine

## 2013-07-06 VITALS — BP 117/73 | HR 100 | Wt 129.0 lb

## 2013-07-06 DIAGNOSIS — H6990 Unspecified Eustachian tube disorder, unspecified ear: Secondary | ICD-10-CM

## 2013-07-06 DIAGNOSIS — H6980 Other specified disorders of Eustachian tube, unspecified ear: Secondary | ICD-10-CM

## 2013-07-06 DIAGNOSIS — H6993 Unspecified Eustachian tube disorder, bilateral: Secondary | ICD-10-CM

## 2013-07-06 DIAGNOSIS — H6983 Other specified disorders of Eustachian tube, bilateral: Secondary | ICD-10-CM

## 2013-07-06 MED ORDER — FLUTICASONE PROPIONATE 50 MCG/ACT NA SUSP: NASAL | Status: DC

## 2013-07-06 MED ORDER — AZITHROMYCIN 250 MG PO TABS: ORAL_TABLET | ORAL | Status: DC

## 2013-07-06 MED ORDER — PREDNISONE 50 MG PO TABS: 50.0000 mg | ORAL_TABLET | Freq: Every day | ORAL | Status: DC

## 2013-07-06 NOTE — Telephone Encounter (Signed)
Pt calls today & states that you dx her with bronchitis & ear infec a couple weeks ago. She said she finished the abx last wed & this past sat she started feeling bad again & her ears feel like they're clogged up.  Would you like a f/u appt with her or can you send something else in? Please advise

## 2013-07-06 NOTE — Progress Notes (Signed)
  Subjective:    CC: Still sick  HPI: This is an extremely pleasant 32 year old female he was seen by Tandy Gaw, PA-C approximately 3 weeks ago, diagnosed with sinusitis and treated appropriately with Augmentin. She got better, but unfortunately had some GI discomfort, and now has recurrence of nasal discharge, sore throat, and a sensation of fullness in both ears. Her husband has similar symptoms. Symptoms are mild, persistent. No cough, shortness of breath, fevers, chills, no rash, no subsequent GI symptoms.  Past medical history, Surgical history, Family history not pertinant except as noted below, Social history, Allergies, and medications have been entered into the medical record, reviewed, and no changes needed.   Review of Systems: No fevers, chills, night sweats, weight loss, chest pain, or shortness of breath.   Objective:    General: Well Developed, well nourished, and in no acute distress.  Neuro: Alert and oriented x3, extra-ocular muscles intact, sensation grossly intact.  HEENT: Normocephalic, atraumatic, pupils equal round reactive to light, neck supple, no masses, no lymphadenopathy, thyroid nonpalpable. Postnasal drip in the posterior oropharynx, nasopharynx shows boggy and erythematous turbinates, external ear canals are unremarkable. No tenderness over the maxillary or frontal sinuses. Skin: Warm and dry, no rashes. Cardiac: Regular rate and rhythm, no murmurs rubs or gallops, no lower extremity edema.  Respiratory: Clear to auscultation bilaterally. Not using accessory muscles, speaking in full sentences.  Impression and Recommendations:

## 2013-07-06 NOTE — Assessment & Plan Note (Signed)
This is common after upper respiratory infection including sinusitis. Azithromycin, prednisone, Flonase. Return if no better in 2 weeks.

## 2013-07-06 NOTE — Patient Instructions (Addendum)
Barotitis Media Barotitis media is soreness (inflammation) of the area behind the eardrum (middle ear). This occurs when the auditory tube (Eustachian tube) leading from the back of the throat to the eardrum is blocked. When it is blocked air cannot move in and out of the middle ear to equalize pressure changes. These pressure changes come from changes in altitude when:  Flying.  Driving in the mountains.  Diving. Problems are more likely to occur with pressure changes during times when you are congested as from:  Hay fever.  Upper respiratory infection.  A cold. Damage or hearing loss (barotrauma) caused by this may be permanent. HOME CARE INSTRUCTIONS   Use medicines as recommended by your caregiver. Over the counter medicines will help unblock the canal and can help during times of air travel.  Do not put anything into your ears to clean or unplug them. Eardrops will not be helpful.  Do not swim, dive, or fly until your caregiver says it is all right to do so. If these activities are necessary, chewing gum with frequent swallowing may help. It is also helpful to hold your nose and gently blow to pop your ears for equalizing pressure changes. This forces air into the Eustachian tube.  For little ones with problems, give your baby a bottle of water or juice during periods when pressure changes would be anticipated such as during take offs and landings associated with air travel.  Only take over-the-counter or prescription medicines for pain, discomfort, or fever as directed by your caregiver.  A decongestant may be helpful in de-congesting the middle ear and make pressure equalization easier. This can be even more effective if the drops (spray) are delivered with the head lying over the edge of a bed with the head tilted toward the ear on the affected side.  If your caregiver has given you a follow-up appointment, it is very important to keep that appointment. Not keeping the  appointment could result in a chronic or permanent injury, pain, hearing loss and disability. If there is any problem keeping the appointment, you must call back to this facility for assistance. SEEK IMMEDIATE MEDICAL CARE IF:   You develop a severe headache, dizziness, severe ear pain, or bloody or pus-like drainage from your ears.  An oral temperature above 102 F (38.9 C) develops.  Your problems do not improve or become worse. MAKE SURE YOU:   Understand these instructions.  Will watch your condition.  Will get help right away if you are not doing well or get worse. Document Released: 11/27/2000 Document Revised: 02/22/2012 Document Reviewed: 07/05/2008 [employer] Patient Information 2014 [employer].  

## 2013-07-07 NOTE — Telephone Encounter (Signed)
Per records pt was seen by Dr.T yesterday.

## 2013-09-12 ENCOUNTER — Encounter: Payer: Self-pay | Admitting: Obstetrics & Gynecology

## 2013-09-12 ENCOUNTER — Ambulatory Visit (INDEPENDENT_AMBULATORY_CARE_PROVIDER_SITE_OTHER): Payer: Federal, State, Local not specified - PPO | Admitting: Obstetrics & Gynecology

## 2013-09-12 VITALS — BP 116/76 | HR 91 | Resp 16 | Ht 65.0 in | Wt 133.0 lb

## 2013-09-12 DIAGNOSIS — Z803 Family history of malignant neoplasm of breast: Secondary | ICD-10-CM

## 2013-09-12 DIAGNOSIS — Z01419 Encounter for gynecological examination (general) (routine) without abnormal findings: Secondary | ICD-10-CM

## 2013-09-12 NOTE — Progress Notes (Signed)
  Subjective:     Dawn [name] is a 32 y.o. female here for a routine exam.  Current complaints: none.  Has mirena and gets monthly menses.  Personal health questionnaire reviewed: yes.   Gynecologic History Patient's last menstrual period was 09/05/2013. Contraception: IUD Last Pap: 2013. Results were: normal--negative cotesting   Obstetric History OB History  Gravida Para Term Preterm AB SAB TAB Ectopic Multiple Living  1 1 1       1     # Outcome Date GA Lbr Len/2nd Weight Sex Delivery Anes PTL Lv  1 TRM 07/25/11 [redacted]w[redacted]d 06:19 / 01:14 6 lb 15 oz (3.147 kg) F SVD None  Y     Comments: wnl       The following portions of the patient's history were reviewed and updated as appropriate: allergies, current medications, past family history, past medical history, past social history, past surgical history and problem list.  Review of Systems Pertinent items are noted in HPI.    Objective:      Filed Vitals:   09/12/13 1532  BP: 116/76  Pulse: 91  Resp: 16  Height: 5\' 5"  (1.651 m)  Weight: 133 lb (60.328 kg)   Vitals:  WNL General appearance: alert, cooperative and no distress Head: Normocephalic, without obvious abnormality, atraumatic Eyes: negative Throat: lips, mucosa, and tongue normal; teeth and gums normal Lungs: clear to auscultation bilaterally Breasts: normal appearance, no masses or tenderness, No nipple retraction or dimpling, No nipple discharge or bleeding Heart: regular rate and rhythm Abdomen: soft, non-tender; bowel sounds normal; no masses,  no organomegaly Pelvic: cervix normal in appearance, external genitalia normal, no adnexal masses or tenderness, no bladder tenderness, no cervical motion tenderness, perianal skin: no external genital warts noted, IUD string seen urethra without abnormality or discharge, uterus normal size, shape, and consistency and vagina normal without discharge Extremities: no edema, redness or tenderness in the calves or  thighs Skin: no lesions or rash Lymph nodes: Axillary adenopathy: none         Assessment:    Healthy female exam.    Plan:    Education reviewed: self breast exams and skin cancer screening. Contraception: IUD. Follow up in: 1 year. Continue IUD Pt has 2 aunts and 1 GM with breast cancer.  Discussed BRCA screening.  Pt to ask relatives and will contact us if she wants BRCA screening.

## 2013-10-19 ENCOUNTER — Other Ambulatory Visit: Payer: Self-pay

## 2013-12-08 ENCOUNTER — Encounter: Payer: Self-pay | Admitting: Physician Assistant

## 2013-12-08 ENCOUNTER — Ambulatory Visit (INDEPENDENT_AMBULATORY_CARE_PROVIDER_SITE_OTHER): Payer: Federal, State, Local not specified - PPO | Admitting: Physician Assistant

## 2013-12-08 VITALS — BP 110/64 | HR 90 | Temp 97.9°F | Wt 136.0 lb

## 2013-12-08 DIAGNOSIS — H6690 Otitis media, unspecified, unspecified ear: Secondary | ICD-10-CM

## 2013-12-08 DIAGNOSIS — H6691 Otitis media, unspecified, right ear: Secondary | ICD-10-CM

## 2013-12-08 MED ORDER — AMOXICILLIN-POT CLAVULANATE 875-125 MG PO TABS: 1.0000 | ORAL_TABLET | Freq: Two times a day (BID) | ORAL | Status: DC

## 2013-12-08 NOTE — Progress Notes (Signed)
   Subjective:    Patient ID: Dawn [name], female    DOB: 1981-04-17, 32 y.o.   MRN: [id number]  HPI Patient is a 32 yo female who presents to the clinic with right ear pain. She has a 1 1/2 week hx of upper respiratory infection symptoms such as Runny nose, nasal congestion, cough, sore throat. Her daughter has also been sick for the last week. She has been treating her symptoms with over-the-counter Tylenol Cold and sinus. She denies any productive cough. She denies any shortness of breath, wheezing, fever or chills. She has not had any nausea or vomiting. Her ear pain started acutely yesterday. Patient has a history of ear infections after colds.    Review of Systems     Objective:   Physical Exam  Constitutional: She appears well-developed and well-nourished.  Right TM erythematous and dull. Ossicles are not able to be viewed. Right canal extremely erythematous. Does appear to have blood behind TM.  Left TM clear.  No maxillary or frontal sinus tenderness to palpation.  Bilateral nasal turbinates red and swollen.  HENT:  Head: Normocephalic and atraumatic.  Left Ear: External ear normal.  Mouth/Throat: Oropharynx is clear and moist.  Eyes: Conjunctivae are normal. Right eye exhibits no discharge. Left eye exhibits no discharge.  Neck: Normal range of motion. Neck supple.  Cardiovascular: Normal rate, regular rhythm and normal heart sounds.   Pulmonary/Chest: Effort normal and breath sounds normal.  Lymphadenopathy:    She has no cervical adenopathy.  Skin: Skin is warm and dry.  Psychiatric: She has a normal mood and affect. Her behavior is normal.          Assessment & Plan:  Right otitis media- treated with Augmentin for 10 days. Gave handout for symptomatic control. Discussed Tylenol and Motrin for pain. Call if not improving.

## 2013-12-08 NOTE — Patient Instructions (Signed)

## 2013-12-12 ENCOUNTER — Telehealth: Payer: Self-pay | Admitting: *Deleted

## 2013-12-12 NOTE — Telephone Encounter (Signed)
Pt left message stating that her right ear is still bothering her & feels full.  She stated that you advised her to call back if she wasn't improving & maybe we could send in some drops for her.  Please advise.

## 2013-12-13 ENCOUNTER — Other Ambulatory Visit: Payer: Self-pay | Admitting: Physician Assistant

## 2013-12-13 MED ORDER — METHYLPREDNISOLONE 4 MG PO KIT: PACK | ORAL | Status: DC

## 2013-12-13 MED ORDER — ACETIC ACID 2 % OT SOLN: 4.0000 [drp] | Freq: Three times a day (TID) | OTIC | Status: DC

## 2013-12-13 NOTE — Telephone Encounter (Signed)
Yes I will send you some drops as well as rx for prednisone. Likely you have a swollen eustachian tube and preventing all fluid from leaving ear thus leaving you with pressure sensation.

## 2013-12-13 NOTE — Telephone Encounter (Signed)
Pt notified of rx. 

## 2014-03-12 ENCOUNTER — Encounter: Payer: Self-pay | Admitting: Physician Assistant

## 2014-03-12 ENCOUNTER — Ambulatory Visit (INDEPENDENT_AMBULATORY_CARE_PROVIDER_SITE_OTHER): Payer: Federal, State, Local not specified - PPO | Admitting: Physician Assistant

## 2014-03-12 ENCOUNTER — Ambulatory Visit: Payer: Federal, State, Local not specified - PPO | Admitting: Physician Assistant

## 2014-03-12 VITALS — BP 125/71 | HR 93 | Temp 98.2°F | Ht 65.0 in | Wt 135.0 lb

## 2014-03-12 DIAGNOSIS — IMO0001 Reserved for inherently not codable concepts without codable children: Secondary | ICD-10-CM

## 2014-03-12 DIAGNOSIS — J069 Acute upper respiratory infection, unspecified: Secondary | ICD-10-CM

## 2014-03-12 DIAGNOSIS — M7918 Myalgia, other site: Secondary | ICD-10-CM

## 2014-03-12 MED ORDER — CYCLOBENZAPRINE HCL 10 MG PO TABS: 10.0000 mg | ORAL_TABLET | Freq: Three times a day (TID) | ORAL | Status: DC | PRN

## 2014-03-12 MED ORDER — DICLOFENAC EPOLAMINE 1.3 % TD PTCH: 1.0000 | MEDICATED_PATCH | Freq: Two times a day (BID) | TRANSDERMAL | Status: DC

## 2014-03-12 MED ORDER — AMOXICILLIN 500 MG PO CAPS: 500.0000 mg | ORAL_CAPSULE | Freq: Two times a day (BID) | ORAL | Status: DC

## 2014-03-12 NOTE — Patient Instructions (Signed)
Start flonase daily. Take sudafed to help dry up drainage. Increase vitamin C and zinc.  Upper Respiratory Infection, Adult An upper respiratory infection (URI) is also known as the common cold. It is often caused by a type of germ (virus). Colds are easily spread (contagious). You can pass it to others by kissing, coughing, sneezing, or drinking out of the same glass. Usually, you get better in 1 or 2 weeks.  HOME CARE   Only take medicine as told by your doctor.  Use a warm mist humidifier or breathe in steam from a hot shower.  Drink enough water and fluids to keep your pee (urine) clear or pale yellow.  Get plenty of rest.  Return to work when your temperature is back to normal or as told by your doctor. You may use a face mask and wash your hands to stop your cold from spreading. GET HELP RIGHT AWAY IF:   After the first few days, you feel you are getting worse.  You have questions about your medicine.  You have chills, shortness of breath, or brown or red spit (mucus).  You have yellow or brown snot (nasal discharge) or pain in the face, especially when you bend forward.  You have a fever, puffy (swollen) neck, pain when you swallow, or white spots in the back of your throat.  You have a bad headache, ear pain, sinus pain, or chest pain.  You have a high-pitched whistling sound when you breathe in and out (wheezing).  You have a lasting cough or cough up blood.  You have sore muscles or a stiff neck. MAKE SURE YOU:   Understand these instructions.  Will watch your condition.  Will get help right away if you are not doing well or get worse. Document Released: 05/18/2008 Document Revised: 02/22/2012 Document Reviewed: 04/06/2011 [facility] Patient Information 2014 [place], [place].

## 2014-03-12 NOTE — Progress Notes (Signed)
   Subjective:    Patient ID: Dawn [name], female    DOB: [birth year], 33 y.o.   MRN: [id number]  HPI Pt is a 33 yo female who presents with left upper back pain and ST.   Upper back pain started a few months ago. Worse at end of day. Best in am. No injury known. Upper back feels tight and sometimes into neck. Can palpate a spot of pain and tightness. Ibuprofen does not really help. Not tried anything else. Shoulder is not painful.  ST started last night and into the morning. Husband started being sick about 1 week ago. He just today start abx. She is feeling more and more fatigued. She has a dry cough occasionally. Denies any fever, chills, n/v/d, sinus pressure, wheezing or SOB. She does have some nasal congestion. Not tried anything.    Review of Systems     Objective:   Physical Exam  Constitutional: She is oriented to person, place, and time. She appears well-developed and well-nourished.  HENT:  Head: Normocephalic and atraumatic.  Right Ear: External ear normal.  Left Ear: External ear normal.  Mouth/Throat: Oropharynx is clear and moist.  TM's injected bilaterally.  Nasal turbinates red, swollen, with rhinorrhea.  Negative maxillary/frontal sinus tenderness.   Eyes: Conjunctivae are normal. Right eye exhibits no discharge. Left eye exhibits no discharge.  Neck: Normal range of motion. Neck supple.  Cardiovascular: Normal rate, regular rhythm and normal heart sounds.   Pulmonary/Chest: Effort normal and breath sounds normal. She has no wheezes.  Musculoskeletal:       Arms: Full ROM of bilateral shoulders. No tenderness over spine.   Lymphadenopathy:    She has no cervical adenopathy.  Neurological: She is alert and oriented to person, place, and time.  Skin: Skin is dry.  Psychiatric: She has a normal mood and affect. Her behavior is normal.          Assessment & Plan:  Rhomboid muscle strain- flexeril to take up to tid and daily at bedtime. Gave sample and rx  of flector patch over muscle that is aching and tight. Gave stretches to try. Consider massage therapy and heat. Follow up if not improving.   URI- discussed with pt seems viral at this point and could clear on its own with proper management. Dicussed vitamin C and zinc as well as sudafed for a couple of days. Since husbands did progress did give amoxil to take if worsening in the next 36 hours. Gave HO for other symptomatic care. Call if not continuing to improve.

## 2014-08-10 ENCOUNTER — Telehealth: Payer: Self-pay | Admitting: Physician Assistant

## 2014-08-10 NOTE — Telephone Encounter (Signed)
Ok with me 

## 2014-08-10 NOTE — Telephone Encounter (Signed)
Okay with me if okay with Lesly Rubenstein

## 2014-08-10 NOTE — Telephone Encounter (Signed)
Patient called and request to change providers from [facility] to Dr. Karie Schwalbe her husband see's T and she wants to change over. Thanks

## 2014-08-27 ENCOUNTER — Telehealth: Payer: Self-pay

## 2014-08-27 ENCOUNTER — Encounter: Payer: Self-pay | Admitting: Sports Medicine

## 2014-08-27 ENCOUNTER — Ambulatory Visit (INDEPENDENT_AMBULATORY_CARE_PROVIDER_SITE_OTHER): Payer: Federal, State, Local not specified - PPO | Admitting: Sports Medicine

## 2014-08-27 VITALS — BP 126/81 | HR 83 | Ht 65.0 in | Wt 134.0 lb

## 2014-08-27 DIAGNOSIS — F329 Major depressive disorder, single episode, unspecified: Secondary | ICD-10-CM

## 2014-08-27 DIAGNOSIS — E049 Nontoxic goiter, unspecified: Secondary | ICD-10-CM

## 2014-08-27 DIAGNOSIS — F3289 Other specified depressive episodes: Secondary | ICD-10-CM | POA: Diagnosis not present

## 2014-08-27 DIAGNOSIS — M7918 Myalgia, other site: Secondary | ICD-10-CM | POA: Insufficient documentation

## 2014-08-27 DIAGNOSIS — Z01419 Encounter for gynecological examination (general) (routine) without abnormal findings: Secondary | ICD-10-CM

## 2014-08-27 DIAGNOSIS — M5412 Radiculopathy, cervical region: Secondary | ICD-10-CM | POA: Diagnosis not present

## 2014-08-27 DIAGNOSIS — F32A Depression, unspecified: Secondary | ICD-10-CM | POA: Insufficient documentation

## 2014-08-27 DIAGNOSIS — Z0000 Encounter for general adult medical examination without abnormal findings: Secondary | ICD-10-CM

## 2014-08-27 HISTORY — DX: Myalgia, other site: M79.18

## 2014-08-27 MED ORDER — CITALOPRAM HYDROBROMIDE 20 MG PO TABS: 20.0000 mg | ORAL_TABLET | Freq: Every day | ORAL | Status: DC

## 2014-08-27 MED ORDER — PREDNISONE 50 MG PO TABS: ORAL_TABLET | ORAL | Status: DC

## 2014-08-27 NOTE — Assessment & Plan Note (Signed)
Physical performed today. Routine blood work will be checked.

## 2014-08-27 NOTE — Assessment & Plan Note (Signed)
Starting citalopram. Return in one month.

## 2014-08-27 NOTE — Assessment & Plan Note (Signed)
Bilateral and C8. Formal PT, prednisone. Return in one month.

## 2014-08-27 NOTE — Assessment & Plan Note (Signed)
I am able to palpate a right sided nodule, I'm worried that this is in the thyroid. Checking an ultrasound and blood work.

## 2014-08-27 NOTE — Telephone Encounter (Signed)
Patient stated that she has a family history Chron's  Disease she wants to know that since she is over the age of 65 should she be evaluated for it. Levon Penning,CMA

## 2014-08-27 NOTE — Progress Notes (Signed)
  Subjective:    CC: Complete physical  HPI:  Preventive medicine: Last Pap smear was 2 years ago, normal.  Neck and shoulder pain: With numbness and tingling to both arms in a C8 distribution, moderate, persistent, no trauma.  History of goiter/cyst:  Has had a recent TSH, no T3-T4. Post cyst drainage.  Depression: Endorses anhedonia, depressed mood, increasing anxiety, no suicidal or homicidal ideation, poor sleep, poor interest, poor concentration.  Past medical history, Surgical history, Family history not pertinant except as noted below, Social history, Allergies, and medications have been entered into the medical record, reviewed, and no changes needed.   Review of Systems: No headache, visual changes, nausea, vomiting, diarrhea, constipation, dizziness, abdominal pain, skin rash, fevers, chills, night sweats, swollen lymph nodes, weight loss, chest pain, body aches, joint swelling, muscle aches, shortness of breath, mood changes, visual or auditory hallucinations.  Objective:    General: Well Developed, well nourished, and in no acute distress. Tearful. Neuro: Alert and oriented x3, extra-ocular muscles intact, sensation grossly intact. Cranial nerves II through XII are intact, motor, sensory, and coordinative functions are all intact. HEENT: Normocephalic, atraumatic, pupils equal round reactive to light, neck supple, no masses, no lymphadenopathy, thyroid nonpalpable. Oropharynx, nasopharynx, external ear canals are unremarkable. There is a palpable nodule on the right side of the neck just caudal to the bifurcation of the common carotid artery, it is in the location of the right lobe of the thyroid. Skin: Warm and dry, no rashes noted.  Cardiac: Regular rate and rhythm, no murmurs rubs or gallops.  Respiratory: Clear to auscultation bilaterally. Not using accessory muscles, speaking in full sentences.  Abdominal: Soft, nontender, nondistended, positive bowel sounds, no masses, no  organomegaly.  Neck: Negative spurling's Full neck range of motion Grip strength and sensation normal in bilateral hands Strength good C4 to T1 distribution No sensory change to C4 to T1 Reflexes normal  Impression and Recommendations:    The patient was counselled, risk factors were discussed, anticipatory guidance given.

## 2014-08-27 NOTE — Telephone Encounter (Signed)
If she is not having any GI symptoms diarrhea, abdominal pain, blood in stools. No she does not need to be evaluated. Dx is made by colonsocopy and that would not be indication. If you start to have those symptoms come in for evaluation and due to family hx could consider colonoscopy.

## 2014-08-28 ENCOUNTER — Telehealth: Payer: Self-pay | Admitting: Physician Assistant

## 2014-08-28 NOTE — Telephone Encounter (Signed)
No problem.

## 2014-08-28 NOTE — Telephone Encounter (Signed)
Patient has been advised. Rhonda Cunningham,CMA  

## 2014-08-28 NOTE — Telephone Encounter (Signed)
Patient called and requested to switch her pcp from Tandy Gaw to Dr. Benjamin Stain and if this is a problem, please let me know. Thanks, [employer]

## 2014-08-29 ENCOUNTER — Other Ambulatory Visit: Payer: Federal, State, Local not specified - PPO

## 2014-08-29 ENCOUNTER — Ambulatory Visit (INDEPENDENT_AMBULATORY_CARE_PROVIDER_SITE_OTHER): Payer: Federal, State, Local not specified - PPO

## 2014-08-29 DIAGNOSIS — E042 Nontoxic multinodular goiter: Secondary | ICD-10-CM

## 2014-08-29 DIAGNOSIS — E049 Nontoxic goiter, unspecified: Secondary | ICD-10-CM

## 2014-08-29 LAB — COMPREHENSIVE METABOLIC PANEL
ALT: <8 U/L (ref 0–35)
AST: 18 U/L (ref 0–37)
Albumin: 4.6 g/dL (ref 3.5–5.2)
Alkaline Phosphatase: 38 U/L — ABNORMAL LOW (ref 39–117)
BUN: 11 mg/dL (ref 6–23)
Calcium: 9.3 mg/dL (ref 8.4–10.5)
Chloride: 103 mEq/L (ref 96–112)
Creat: 0.63 mg/dL (ref 0.50–1.10)
Glucose, Bld: 84 mg/dL (ref 70–99)
Potassium: 3.7 mEq/L (ref 3.5–5.3)
Sodium: 139 mEq/L (ref 135–145)
Total Bilirubin: 0.7 mg/dL (ref 0.2–1.2)
Total Protein: 6.5 g/dL (ref 6.0–8.3)

## 2014-08-29 LAB — CBC
HCT: 39.2 % (ref 36.0–46.0)
Hemoglobin: 13.1 g/dL (ref 12.0–15.0)
MCH: 29.7 pg (ref 26.0–34.0)
MCHC: 33.4 g/dL (ref 30.0–36.0)
MCV: 88.9 fL (ref 78.0–100.0)
Platelets: 167 10*3/uL (ref 150–400)
RBC: 4.41 MIL/uL (ref 3.87–5.11)
RDW: 14.1 % (ref 11.5–15.5)
WBC: 7.3 K/uL (ref 4.0–10.5)

## 2014-08-29 LAB — LIPID PANEL
Cholesterol: 207 mg/dL — ABNORMAL HIGH (ref 0–200)
HDL: 71 mg/dL (ref >39)
LDL Cholesterol: 118 mg/dL — ABNORMAL HIGH (ref 0–99)
Total CHOL/HDL Ratio: 2.9 Ratio
Triglycerides: 89 mg/dL (ref <150)
VLDL: 18 mg/dL (ref 0–40)

## 2014-08-29 LAB — COMPREHENSIVE METABOLIC PANEL WITH GFR: CO2: 28 meq/L (ref 19–32)

## 2014-08-29 NOTE — Assessment & Plan Note (Signed)
Nontoxic, right-sided 1.9 cm thyroid nodule will require needle biopsy.

## 2014-08-30 LAB — HEMOGLOBIN A1C
Hgb A1c MFr Bld: 5.4 % (ref <5.7)
Mean Plasma Glucose: 108 mg/dL (ref <117)

## 2014-08-30 LAB — T4, FREE: Free T4: 1.13 ng/dL (ref 0.80–1.80)

## 2014-08-30 LAB — TSH: TSH: 1.045 u[IU]/mL (ref 0.350–4.500)

## 2014-08-30 LAB — T3, FREE: T3, Free: 2.7 pg/mL (ref 2.3–4.2)

## 2014-08-31 ENCOUNTER — Ambulatory Visit (INDEPENDENT_AMBULATORY_CARE_PROVIDER_SITE_OTHER): Payer: Federal, State, Local not specified - PPO | Admitting: Physical Therapy

## 2014-08-31 DIAGNOSIS — R5381 Other malaise: Secondary | ICD-10-CM

## 2014-08-31 DIAGNOSIS — M542 Cervicalgia: Secondary | ICD-10-CM

## 2014-08-31 DIAGNOSIS — M5412 Radiculopathy, cervical region: Secondary | ICD-10-CM

## 2014-09-03 ENCOUNTER — Encounter (INDEPENDENT_AMBULATORY_CARE_PROVIDER_SITE_OTHER): Payer: Federal, State, Local not specified - PPO | Admitting: Physical Therapy

## 2014-09-03 DIAGNOSIS — M5412 Radiculopathy, cervical region: Secondary | ICD-10-CM

## 2014-09-03 DIAGNOSIS — M542 Cervicalgia: Secondary | ICD-10-CM

## 2014-09-03 DIAGNOSIS — R5381 Other malaise: Secondary | ICD-10-CM

## 2014-09-05 ENCOUNTER — Encounter (INDEPENDENT_AMBULATORY_CARE_PROVIDER_SITE_OTHER): Payer: Federal, State, Local not specified - PPO | Admitting: Physical Therapy

## 2014-09-05 ENCOUNTER — Other Ambulatory Visit (HOSPITAL_COMMUNITY)
Admission: RE | Admit: 2014-09-05 | Discharge: 2014-09-05 | Disposition: A | Discharge status: Home / Self Care | Payer: Federal, State, Local not specified - PPO | Source: Ambulatory Visit | Attending: Interventional Radiology | Admitting: Interventional Radiology

## 2014-09-05 ENCOUNTER — Ambulatory Visit
Admission: RE | Admit: 2014-09-05 | Discharge: 2014-09-05 | Disposition: A | Discharge status: Home / Self Care | Payer: Federal, State, Local not specified - PPO | Source: Ambulatory Visit | Attending: Sports Medicine | Admitting: Sports Medicine

## 2014-09-05 DIAGNOSIS — E041 Nontoxic single thyroid nodule: Secondary | ICD-10-CM | POA: Diagnosis present

## 2014-09-05 DIAGNOSIS — R5381 Other malaise: Secondary | ICD-10-CM

## 2014-09-05 DIAGNOSIS — M542 Cervicalgia: Secondary | ICD-10-CM

## 2014-09-05 DIAGNOSIS — M5412 Radiculopathy, cervical region: Secondary | ICD-10-CM

## 2014-09-05 DIAGNOSIS — E042 Nontoxic multinodular goiter: Secondary | ICD-10-CM

## 2014-09-05 DIAGNOSIS — E049 Nontoxic goiter, unspecified: Secondary | ICD-10-CM

## 2014-09-06 ENCOUNTER — Telehealth: Payer: Self-pay

## 2014-09-06 NOTE — Telephone Encounter (Signed)
Patient called stated that her depression medication is causing her to have headaches and to feel nauseated. She wants to know what she should do. Rhonda Cunningham,CMA  Rhonda Cunningham,CMA

## 2014-09-06 NOTE — Telephone Encounter (Signed)
This occasionally happens initially, switch to one half tab daily for a week then go to one full tab, push through it and they will resolve.

## 2014-09-07 NOTE — Telephone Encounter (Signed)
Patient has been informed to take a half tablet for 1 week then a whole table thereafter. Kimisha Eunice,CMA

## 2014-09-10 ENCOUNTER — Encounter (INDEPENDENT_AMBULATORY_CARE_PROVIDER_SITE_OTHER): Payer: Federal, State, Local not specified - PPO | Admitting: Physical Therapy

## 2014-09-10 DIAGNOSIS — M5412 Radiculopathy, cervical region: Secondary | ICD-10-CM

## 2014-09-10 DIAGNOSIS — M542 Cervicalgia: Secondary | ICD-10-CM

## 2014-09-10 DIAGNOSIS — R5381 Other malaise: Secondary | ICD-10-CM

## 2014-09-12 ENCOUNTER — Other Ambulatory Visit: Payer: Federal, State, Local not specified - PPO

## 2014-09-12 ENCOUNTER — Encounter (INDEPENDENT_AMBULATORY_CARE_PROVIDER_SITE_OTHER): Payer: Federal, State, Local not specified - PPO | Admitting: Physical Therapy

## 2014-09-12 DIAGNOSIS — R5381 Other malaise: Secondary | ICD-10-CM

## 2014-09-12 DIAGNOSIS — M542 Cervicalgia: Secondary | ICD-10-CM

## 2014-09-12 DIAGNOSIS — M5412 Radiculopathy, cervical region: Secondary | ICD-10-CM

## 2014-09-18 ENCOUNTER — Encounter (INDEPENDENT_AMBULATORY_CARE_PROVIDER_SITE_OTHER): Payer: Federal, State, Local not specified - PPO | Admitting: Physical Therapy

## 2014-09-18 DIAGNOSIS — M542 Cervicalgia: Secondary | ICD-10-CM

## 2014-09-18 DIAGNOSIS — R5381 Other malaise: Secondary | ICD-10-CM

## 2014-09-20 ENCOUNTER — Encounter (INDEPENDENT_AMBULATORY_CARE_PROVIDER_SITE_OTHER): Payer: Federal, State, Local not specified - PPO | Admitting: Physical Therapy

## 2014-09-20 DIAGNOSIS — M542 Cervicalgia: Secondary | ICD-10-CM

## 2014-09-20 DIAGNOSIS — M5412 Radiculopathy, cervical region: Secondary | ICD-10-CM

## 2014-09-20 DIAGNOSIS — R5381 Other malaise: Secondary | ICD-10-CM

## 2014-09-24 ENCOUNTER — Telehealth: Payer: Self-pay

## 2014-09-24 ENCOUNTER — Encounter (INDEPENDENT_AMBULATORY_CARE_PROVIDER_SITE_OTHER): Payer: Federal, State, Local not specified - PPO | Admitting: Physical Therapy

## 2014-09-24 ENCOUNTER — Ambulatory Visit (INDEPENDENT_AMBULATORY_CARE_PROVIDER_SITE_OTHER): Payer: Federal, State, Local not specified - PPO | Admitting: Sports Medicine

## 2014-09-24 ENCOUNTER — Ambulatory Visit (INDEPENDENT_AMBULATORY_CARE_PROVIDER_SITE_OTHER): Payer: Federal, State, Local not specified - PPO

## 2014-09-24 ENCOUNTER — Encounter: Payer: Self-pay | Admitting: Sports Medicine

## 2014-09-24 VITALS — BP 119/76 | HR 79 | Ht 65.0 in | Wt 133.0 lb

## 2014-09-24 DIAGNOSIS — R3915 Urgency of urination: Secondary | ICD-10-CM

## 2014-09-24 DIAGNOSIS — E042 Nontoxic multinodular goiter: Secondary | ICD-10-CM | POA: Diagnosis not present

## 2014-09-24 DIAGNOSIS — M5412 Radiculopathy, cervical region: Secondary | ICD-10-CM

## 2014-09-24 DIAGNOSIS — R5381 Other malaise: Secondary | ICD-10-CM

## 2014-09-24 DIAGNOSIS — F329 Major depressive disorder, single episode, unspecified: Secondary | ICD-10-CM

## 2014-09-24 DIAGNOSIS — N3281 Overactive bladder: Secondary | ICD-10-CM | POA: Insufficient documentation

## 2014-09-24 DIAGNOSIS — F32A Depression, unspecified: Secondary | ICD-10-CM

## 2014-09-24 DIAGNOSIS — M542 Cervicalgia: Secondary | ICD-10-CM

## 2014-09-24 HISTORY — DX: Overactive bladder: N32.81

## 2014-09-24 MED ORDER — CYCLOBENZAPRINE HCL 10 MG PO TABS: ORAL_TABLET | ORAL | Status: DC

## 2014-09-24 NOTE — Assessment & Plan Note (Addendum)
Persistent despite physical therapy. Left C5 periscapular distribution. X-rays, MRI. Adding low-dose Flexeril at bedtime.

## 2014-09-24 NOTE — Telephone Encounter (Signed)
MRI of cervical spine without contrast does not require a PA.

## 2014-09-24 NOTE — Progress Notes (Signed)
  Subjective:    CC: Followup  HPI: Depression: Improved slightly on citalopram 20, continues to improve.  Cervical radiculitis: Left periscapular, worse at night, improved after physical therapy but still present.  History of goiter : aspiration biopsy showed benign goiter, on further questioning, she uses extensively and exclusively not iodized sea salt.  Urinary urgency: History of overactive bladder, for the past several weeks she has had urgency and frequency without dysuria. No fevers, chills, abdominal pain, flank pain.  Right hip pain: Localized over the lateral iliac crest, present for one week.  Past medical history, Surgical history, Family history not pertinant except as noted below, Social history, Allergies, and medications have been entered into the medical record, reviewed, and no changes needed.   Review of Systems: No fevers, chills, night sweats, weight loss, chest pain, or shortness of breath.   Objective:    General: Well Developed, well nourished, and in no acute distress.  Neuro: Alert and oriented x3, extra-ocular muscles intact, sensation grossly intact.  HEENT: Normocephalic, atraumatic, pupils equal round reactive to light, neck supple, no masses, no lymphadenopathy, thyroid nonpalpable.  Skin: Warm and dry, no rashes. Cardiac: Regular rate and rhythm, no murmurs rubs or gallops, no lower extremity edema.  Respiratory: Clear to auscultation bilaterally. Not using accessory muscles, speaking in full sentences. Right Hip: ROM IR: 60 Deg, ER: 60 Deg, Flexion: 120 Deg, Extension: 100 Deg, Abduction: 45 Deg, Adduction: 45 Deg Strength IR: 5/5, ER: 5/5, Flexion: 5/5, Extension: 5/5, Abduction: 5/5, Adduction: 5/5 Pelvic alignment unremarkable to inspection and palpation. Standing hip rotation and gait without trendelenburg / unsteadiness. Greater trochanter without tenderness to palpation. Slightly tender over the lateral iliac crest. No tenderness over  piriformis. No SI joint tenderness and normal minimal SI movement.  Impression and Recommendations:

## 2014-09-24 NOTE — Assessment & Plan Note (Signed)
On further questioning patient uses sea salt not iodized salt which could be responsible for her nontoxic goiter.

## 2014-09-24 NOTE — Assessment & Plan Note (Signed)
History of overactive bladder. Certainly we could try Myrbetriq. Checking a urinalysis first.

## 2014-09-24 NOTE — Assessment & Plan Note (Signed)
improved slightly with citalopram. Return in one month.

## 2014-09-25 ENCOUNTER — Encounter: Payer: Self-pay | Admitting: Sports Medicine

## 2014-09-25 LAB — URINALYSIS
Bilirubin Urine: NEGATIVE
Glucose, UA: NEGATIVE mg/dL
Hgb urine dipstick: NEGATIVE
Ketones, ur: NEGATIVE mg/dL
Leukocytes, UA: NEGATIVE
Nitrite: NEGATIVE
Protein, ur: NEGATIVE mg/dL
Specific Gravity, Urine: 1.009 (ref 1.005–1.030)
Urobilinogen, UA: 0.2 mg/dL (ref 0.0–1.0)
pH: 7 (ref 5.0–8.0)

## 2014-09-25 LAB — URINE CULTURE
Colony Count: NO GROWTH
Organism ID, Bacteria: NO GROWTH

## 2014-09-26 ENCOUNTER — Telehealth: Payer: Self-pay

## 2014-09-26 NOTE — Telephone Encounter (Signed)
Patient called stated that she would like to be put on a bladder control medication. Montie Gelardi,CMA

## 2014-09-27 ENCOUNTER — Telehealth: Payer: Self-pay | Admitting: Sports Medicine

## 2014-09-27 ENCOUNTER — Encounter (INDEPENDENT_AMBULATORY_CARE_PROVIDER_SITE_OTHER): Payer: Federal, State, Local not specified - PPO | Admitting: Physical Therapy

## 2014-09-27 DIAGNOSIS — R5381 Other malaise: Secondary | ICD-10-CM

## 2014-09-27 DIAGNOSIS — M5412 Radiculopathy, cervical region: Secondary | ICD-10-CM

## 2014-09-27 MED ORDER — MIRABEGRON ER 25 MG PO TB24: 25.0000 mg | ORAL_TABLET | Freq: Every day | ORAL | Status: DC

## 2014-09-27 NOTE — Telephone Encounter (Signed)
Gave patient a 30 day free trial offer for myrbetriq.

## 2014-09-27 NOTE — Telephone Encounter (Signed)
[place], she should come in and get a discount card.

## 2014-09-27 NOTE — Telephone Encounter (Signed)
Spoke to patient she has been informed. Rhonda Cunningham,CMA

## 2014-10-01 ENCOUNTER — Telehealth: Payer: Self-pay | Admitting: *Deleted

## 2014-10-01 ENCOUNTER — Ambulatory Visit (INDEPENDENT_AMBULATORY_CARE_PROVIDER_SITE_OTHER): Payer: Federal, State, Local not specified - PPO

## 2014-10-01 DIAGNOSIS — M79602 Pain in left arm: Secondary | ICD-10-CM

## 2014-10-01 DIAGNOSIS — M5412 Radiculopathy, cervical region: Secondary | ICD-10-CM

## 2014-10-01 DIAGNOSIS — M79601 Pain in right arm: Secondary | ICD-10-CM

## 2014-10-01 DIAGNOSIS — M542 Cervicalgia: Secondary | ICD-10-CM

## 2014-10-01 NOTE — Telephone Encounter (Signed)
Is there another medication that she can use besides Myrebetriq? Her copay is $130 and she is not eligible to use the discount/free card due to her [employer]. Please advise. Corliss Skains, CMA

## 2014-10-02 MED ORDER — SOLIFENACIN SUCCINATE 5 MG PO TABS: 5.0000 mg | ORAL_TABLET | Freq: Every day | ORAL | Status: DC

## 2014-10-02 NOTE — Telephone Encounter (Signed)
Yes, lets switch to vesicare. We will start at a low dose and increase the dose as needed, if this fails we will try something else. Follow up with me in 2-3 weeks for the bladder.

## 2014-10-03 ENCOUNTER — Other Ambulatory Visit: Payer: Self-pay | Admitting: *Deleted

## 2014-10-03 MED ORDER — TOLTERODINE TARTRATE ER 2 MG PO CP24: 2.0000 mg | ORAL_CAPSULE | Freq: Every day | ORAL | Status: DC

## 2014-10-03 NOTE — Telephone Encounter (Signed)
Rx sent to pharmacy. Pt notified. Corliss Skains, CMA

## 2014-10-03 NOTE — Telephone Encounter (Signed)
She expressed that the last time she was on vesicare that it wasn't strong enough or wore off. She asked if you still wanted to start her at a lower dose. Please advise. Corliss Skains, CMA

## 2014-10-03 NOTE — Telephone Encounter (Signed)
Lorina Rabon, so sorry, has she tried Scientist, clinical (histocompatibility and immunogenetics) LA?  If not i'll call that in.

## 2014-10-03 NOTE — Progress Notes (Signed)
Detrol sent to pharmacy. Pt notified. Dawn SkainsJamie Sahvanna Mcmanigal, CMA

## 2014-10-05 ENCOUNTER — Encounter (INDEPENDENT_AMBULATORY_CARE_PROVIDER_SITE_OTHER): Payer: Federal, State, Local not specified - PPO | Admitting: Physical Therapy

## 2014-10-05 DIAGNOSIS — M542 Cervicalgia: Secondary | ICD-10-CM

## 2014-10-05 DIAGNOSIS — M5412 Radiculopathy, cervical region: Secondary | ICD-10-CM

## 2014-10-05 DIAGNOSIS — R5381 Other malaise: Secondary | ICD-10-CM

## 2014-10-10 ENCOUNTER — Encounter (INDEPENDENT_AMBULATORY_CARE_PROVIDER_SITE_OTHER): Payer: Federal, State, Local not specified - PPO | Admitting: Physical Therapy

## 2014-10-10 DIAGNOSIS — R5381 Other malaise: Secondary | ICD-10-CM

## 2014-10-10 DIAGNOSIS — M542 Cervicalgia: Secondary | ICD-10-CM

## 2014-10-10 DIAGNOSIS — M5412 Radiculopathy, cervical region: Secondary | ICD-10-CM

## 2014-10-12 ENCOUNTER — Encounter (INDEPENDENT_AMBULATORY_CARE_PROVIDER_SITE_OTHER): Payer: Federal, State, Local not specified - PPO | Admitting: Physical Therapy

## 2014-10-12 DIAGNOSIS — M542 Cervicalgia: Secondary | ICD-10-CM | POA: Diagnosis not present

## 2014-10-12 DIAGNOSIS — R5381 Other malaise: Secondary | ICD-10-CM

## 2014-10-12 DIAGNOSIS — M5412 Radiculopathy, cervical region: Secondary | ICD-10-CM

## 2014-10-15 ENCOUNTER — Encounter: Payer: Self-pay | Admitting: Sports Medicine

## 2014-10-19 ENCOUNTER — Encounter (INDEPENDENT_AMBULATORY_CARE_PROVIDER_SITE_OTHER): Payer: Federal, State, Local not specified - PPO | Admitting: Physical Therapy

## 2014-10-19 DIAGNOSIS — M5412 Radiculopathy, cervical region: Secondary | ICD-10-CM

## 2014-10-19 DIAGNOSIS — R5381 Other malaise: Secondary | ICD-10-CM

## 2014-10-19 DIAGNOSIS — M542 Cervicalgia: Secondary | ICD-10-CM

## 2014-10-22 ENCOUNTER — Encounter: Payer: Self-pay | Admitting: Sports Medicine

## 2014-10-22 ENCOUNTER — Ambulatory Visit (INDEPENDENT_AMBULATORY_CARE_PROVIDER_SITE_OTHER): Payer: Federal, State, Local not specified - PPO | Admitting: Sports Medicine

## 2014-10-22 VITALS — BP 119/70 | HR 101 | Ht 65.0 in | Wt 134.0 lb

## 2014-10-22 DIAGNOSIS — F329 Major depressive disorder, single episode, unspecified: Secondary | ICD-10-CM

## 2014-10-22 DIAGNOSIS — N3281 Overactive bladder: Secondary | ICD-10-CM | POA: Diagnosis not present

## 2014-10-22 DIAGNOSIS — R202 Paresthesia of skin: Secondary | ICD-10-CM

## 2014-10-22 DIAGNOSIS — R200 Anesthesia of skin: Secondary | ICD-10-CM | POA: Diagnosis not present

## 2014-10-22 DIAGNOSIS — F32A Depression, unspecified: Secondary | ICD-10-CM

## 2014-10-22 MED ORDER — IBUPROFEN 800 MG PO TABS: 800.0000 mg | ORAL_TABLET | Freq: Three times a day (TID) | ORAL | Status: DC | PRN

## 2014-10-22 MED ORDER — CITALOPRAM HYDROBROMIDE 20 MG PO TABS: 30.0000 mg | ORAL_TABLET | Freq: Every day | ORAL | Status: DC

## 2014-10-22 MED ORDER — TOLTERODINE TARTRATE ER 4 MG PO CP24
4.0000 mg | ORAL_CAPSULE | Freq: Every day | ORAL | Status: DC
Start: 2014-10-22 — End: 2015-02-24

## 2014-10-22 NOTE — Progress Notes (Signed)
  Subjective:    CC: follow-up  HPI: Left arm numbness: Persistent, despite physical therapy, MRI of her cervical spine was negative, initially she described more of a C5 distribution in the right upper shoulder but now is also describing a C8 distribution into the last 2 fingers. Moderate, persistent.  Depression: Well-controlled however she gets occasional sad spells, no suicidal or homicidal ideation, amenable to increasing Celexa.  Overactive bladder: No response to Myrbetriq or Vesicare, we did start Detrol LA and she has had a good response, would like to go up on the dose.  Past medical history, Surgical history, Family history not pertinant except as noted below, Social history, Allergies, and medications have been entered into the medical record, reviewed, and no changes needed.   Review of Systems: No fevers, chills, night sweats, weight loss, chest pain, or shortness of breath.   Objective:    General: Well Developed, well nourished, and in no acute distress.  Neuro: Alert and oriented x3, extra-ocular muscles intact, sensation grossly intact.  HEENT: Normocephalic, atraumatic, pupils equal round reactive to light, neck supple, no masses, no lymphadenopathy, thyroid nonpalpable.  Skin: Warm and dry, no rashes. Cardiac: Regular rate and rhythm, no murmurs rubs or gallops, no lower extremity edema.  Respiratory: Clear to auscultation bilaterally. Not using accessory muscles, speaking in full sentences.  Impression and Recommendations:

## 2014-10-22 NOTE — Assessment & Plan Note (Signed)
Overall doing much better. Increasing to 30 mg of citalopram.

## 2014-10-22 NOTE — Assessment & Plan Note (Signed)
Moderate response to Detrol LA. Insufficient response to Vesicare and Myrbetriq. Increasing Detrol LA to 4 mg daily.

## 2014-10-22 NOTE — Assessment & Plan Note (Signed)
Negative cervical spine MRI. Distribution is more C5, it's possible's represents carpal tunnel syndrome.  At this point we are going to proceed with nerve conduction and left her myography.

## 2014-10-25 ENCOUNTER — Encounter: Payer: Self-pay | Admitting: Neurology

## 2014-10-25 ENCOUNTER — Ambulatory Visit (INDEPENDENT_AMBULATORY_CARE_PROVIDER_SITE_OTHER): Payer: Federal, State, Local not specified - PPO | Admitting: Neurology

## 2014-10-25 VITALS — BP 116/73 | HR 89 | Ht 65.5 in | Wt 134.8 lb

## 2014-10-25 DIAGNOSIS — G35 Multiple sclerosis: Secondary | ICD-10-CM

## 2014-10-25 DIAGNOSIS — G729 Myopathy, unspecified: Secondary | ICD-10-CM | POA: Insufficient documentation

## 2014-10-25 DIAGNOSIS — G609 Hereditary and idiopathic neuropathy, unspecified: Secondary | ICD-10-CM

## 2014-10-25 NOTE — Progress Notes (Signed)
[facility]    Provider:  Dr Lucia Gaskins Referring Provider: Monica Becton Primary Care Physician:  Rodney Langton, MD  CC:  Arm pain and weakness  HPI:  Dawn [name] is a 33 y.o. female here as a referral from Dr. Benjamin Stain for arm pain and weakness  Patient has been having left shoulder pain for 6 months. She is having fine motor difficulties, especially when typing. Her arms feel very tired even when holding a book. The left side has shoulder pain, muscular sharp pain. She has radiation into the ulnar forearm and feels like she has to shake the hands out. Has numbness in the fingers most pronounced in left digit 5. Has weakness of grip. Wakes up in the middle of the night with pain, antsy feeling like she has to move her arms. Physical therapy helped some but the ibuprofen helps the most. No inciting factors. She has neck pain. Left is worse than the right. Has cramping in the legs. She has symptoms in the toes. Her arms get tired holding them over her head. She has a heavy feeling even in her arms. No difficulty getting out of low seats or climbing stairs but some perceived weakness in the legs. No double vision or vision changes, no ptosis, no SOB. No FHx of neuromuscular disease.   Reviewed notes, labs and imaging from outside physicians, which showed: personally reviewed images of the cervical spine which were negative for foraminal or spinal stenosis. HgbA1c, cbc and cmp unremarkable. TSH nml. Per notes she has had physical therapy. She initially described more of a C5 distribution in the right shoulder but now a C8 distribution on the left. She is on Citalopram for depression which is helping. She has a history of goiter, aspiration biopsy showed benign goiter. History of overactive bladder  Review of Systems: Patient complains of symptoms per HPI as well as the following symptoms: cough, aching muscles, trouble swallowing, urination problems,  numbness, weakness, difficulty swallowing, depression. Pertinent negatives per HPI. All others negative.   History   Social History  . Marital Status: Married    Spouse Name: Trinna Post    Number of Children: 1  . Years of Education: 12+   Occupational History  . Not on file.   Social History Main Topics  . Smoking status: Never Smoker   . Smokeless tobacco: Never Used  . Alcohol Use: 0.0 oz/week    0 Not specified per week     Comment: 1-2 weekly  . Drug Use: No  . Sexual Activity: Yes    Birth Control/ Protection: IUD     Comment: Mirena - September 2012   Other Topics Concern  . Not on file   Social History Narrative   Patient lives at home with husband and daughter.    Patient is right handed   Patient has one child   Patient has a college degree.     Family History  Problem Relation Age of Onset  . Birth defects Maternal Grandmother   . Cancer Maternal Grandmother     Ovarian  . Birth defects Maternal Grandfather   . Cancer Maternal Grandfather   . Breast cancer Maternal Aunt 42  . Breast cancer Maternal Aunt 50  . Hyperlipidemia Father   . Hypertension Sister     Past Medical History  Diagnosis Date  . Thyroid goiter     benign  . Right tennis elbow     Past Surgical History  Procedure Laterality Date  .  Tonsillectomy      Current Outpatient Prescriptions  Medication Sig Dispense Refill  . citalopram (CELEXA) 20 MG tablet Take 1.5 tablets (30 mg total) by mouth daily. 135 tablet 0  . cyclobenzaprine (FLEXERIL) 10 MG tablet One half tab PO qHS, then increase gradually to one tab TID. 30 tablet 0  . ibuprofen (ADVIL,MOTRIN) 800 MG tablet Take 1 tablet (800 mg total) by mouth every 8 (eight) hours as needed. 60 tablet 2  . IRON CR PO Take 0.5 tablets by mouth.    . levonorgestrel (MIRENA) 20 MCG/24HR IUD 1 each by Intrauterine route once.    Marland Kitchen PRENATAL MULTIVIT-MIN-FE-FA PO Take 1 tablet by mouth.    . prenatal vitamin w/FE, FA (PRENATAL 1 + 1) 27-1 MG  TABS tablet Take 1 tablet by mouth.    . tolterodine (DETROL LA) 4 MG 24 hr capsule Take 1 capsule (4 mg total) by mouth daily. 30 capsule 3   No current facility-administered medications for this visit.    Allergies as of 10/25/2014 - Review Complete 10/25/2014  Allergen Reaction Noted  . Latex Other (See Comments) 01/22/2011    Vitals: BP 116/73 mmHg  Pulse 89  Ht 5' 5.5" (1.664 m)  Wt 134 lb 12.8 oz (61.145 kg)  BMI 22.08 kg/m2 Last Weight:  Wt Readings from Last 1 Encounters:  10/25/14 134 lb 12.8 oz (61.145 kg)   Last Height:   Ht Readings from Last 1 Encounters:  10/25/14 5' 5.5" (1.664 m)    Physical exam: Exam: Gen: NAD, conversant, well nourised, obese, well groomed                     CV: RRR, no MRG. No Carotid Bruits. No peripheral edema, warm, nontender Eyes: Conjunctivae clear without exudates or hemorrhage  Neuro: Detailed Neurologic Exam  Speech:    Speech is normal; fluent and spontaneous with normal comprehension.  Cognition:    The patient is oriented to person, place, and time;     recent and remote memory intact;     language fluent;     normal attention, concentration,     fund of knowledge Cranial Nerves:    The pupils are equal, round, and reactive to light. The fundi are normal and spontaneous venous pulsations are present. Visual fields are full to finger confrontation. Extraocular movements are intact. Trigeminal sensation is intact and the muscles of mastication are normal. The face is symmetric. The palate elevates in the midline. Voice is normal. Shoulder shrug is normal. The tongue has normal motion without fasciculations.   Coordination:    Normal finger to nose and heel to shin. Normal rapid alternating movements.   Gait:    Heel-toe and tandem gait are normal.   Motor Observation:    No asymmetry, no atrophy, and no involuntary movements noted. Tone:    Normal muscle tone.    Posture:    Posture is normal. normal erect      Strength:    Strength is V/V in the upper and lower limbs.      Sensation:     Decreased pin prick and temperature distally  Reflex Exam:  DTR's:    Deep tendon reflexes in the upper and lower extremities are normal bilaterally.   Toes:    The toes are downgoing bilaterally.   Clonus:    Clonus is absent.      Assessment/Plan:  32 year old patient with complaints of neck pain, radiation into the left  ulnar forearm and digit 5, proximal weakness bilaterally upper>lower, paresthesias in all limbs. Neuro exam only significant for distal decreased sensation pp and temp.   Myopathy/proximal weakness: Will check CK and other labs such as for metabolic muscle disease, myasthenia gravis. Will further evaluate on emg Neuropathy: Will order serum workup for neuropathy and further evaluate with emg/ncs Left arm numbness: will evaluate for compression neuropathy vs polyneuropathy vs radiculopathy on emg/ncs Will also order MRI of the brain to evaluate for central disorder such as Multiple Sclerosis given multiple neurologic complaints.   Naomie DeanAntonia Jamil Castillo, MD  [facility] [address] [place], [place] [id number]  Phone [phone] Fax [phone]

## 2014-10-25 NOTE — Patient Instructions (Signed)
Overall you are doing fairly well but I do want to suggest a few things today:   Remember to drink plenty of fluid, eat healthy meals and do not skip any meals. Try to eat protein with a every meal and eat a healthy snack such as fruit or nuts in between meals. Try to keep a regular sleep-wake schedule and try to exercise daily, particularly in the form of walking, 20-30 minutes a day, if you can.    As far as diagnostic testing: MRI of the brain, labwork, EMG/NCS  I would like to see you back in 3 months, sooner if we need to. Please call us with any interim questions, concerns, problems, updates or refill requests.   Please also call us for any test results so we can go over those with you on the phone.  My clinical assistant and will answer any of your questions and relay your messages to me and also relay most of my messages to you.   Our phone number is [phone]. We also have an after hours call service for urgent matters and there is a physician on-call for urgent questions. For any emergencies you know to call 911 or go to the nearest emergency room

## 2014-11-05 ENCOUNTER — Ambulatory Visit (INDEPENDENT_AMBULATORY_CARE_PROVIDER_SITE_OTHER): Payer: Federal, State, Local not specified - PPO

## 2014-11-05 DIAGNOSIS — G35 Multiple sclerosis: Secondary | ICD-10-CM

## 2014-11-05 LAB — IFE AND PE, SERUM
ALPHA 1: 0.2 g/dL (ref 0.1–0.4)
ALPHA2 GLOB SERPL ELPH-MCNC: 0.7 g/dL (ref 0.4–1.2)
Albumin SerPl Elph-Mcnc: 4.3 g/dL (ref 3.2–5.6)
Albumin/Glob SerPl: 1.5 (ref 0.7–2.0)
B-GLOBULIN SERPL ELPH-MCNC: 0.9 g/dL (ref 0.6–1.3)
GAMMA GLOB SERPL ELPH-MCNC: 1.2 g/dL (ref 0.5–1.6)
Globulin, Total: 2.9 g/dL (ref 2.0–4.5)
IGA/IMMUNOGLOBULIN A, SERUM: 85 mg/dL — AB (ref 91–414)
IgG (Immunoglobin G), Serum: 1182 mg/dL (ref 700–1600)
IgM (Immunoglobulin M), Srm: 195 mg/dL (ref 40–230)
Total Protein: 7.2 g/dL (ref 6.0–8.5)

## 2014-11-05 LAB — MAGNESIUM: Magnesium: 2.4 mg/dL (ref 1.6–2.6)

## 2014-11-05 LAB — METHYLMALONIC ACID, SERUM: Methylmalonic Acid: 236 nmol/L (ref 0–378)

## 2014-11-05 LAB — PAN-ANCA
ANCA Proteinase 3: 3.5 U/mL (ref 0.0–3.5)
Atypical pANCA: <1:20 {titer}
C-ANCA: <1:20 {titer}
Myeloperoxidase Ab: <9 U/mL (ref 0.0–9.0)

## 2014-11-05 LAB — HIV ANTIBODY (ROUTINE TESTING W REFLEX)
HIV 1/O/2 Abs-Index Value: <1 (ref <1.00)
HIV-1/HIV-2 Ab: NONREACTIVE

## 2014-11-05 LAB — B12 AND FOLATE PANEL
Folate: 12.9 ng/mL (ref >3.0)
Vitamin B-12: 406 pg/mL (ref 211–946)

## 2014-11-05 LAB — RPR: RPR: NONREACTIVE

## 2014-11-05 LAB — ANA W/REFLEX: Anti Nuclear Antibody(ANA): NEGATIVE

## 2014-11-05 LAB — ACETYLCHOLINE RECEPTOR, BLOCKING: AChR Blocking Abs, Serum: 22 % (ref 0–25)

## 2014-11-05 LAB — LACTIC ACID, PLASMA: Lactate, Ven: 5.8 mg/dL (ref 4.5–19.8)

## 2014-11-05 LAB — ALDOLASE: ALDOLASE: 9.9 U/L (ref 3.3–10.3)

## 2014-11-05 LAB — ACETYLCHOLINE RECEPTOR, BINDING: AChR Binding Ab, Serum: 0.04 nmol/L (ref 0.00–0.24)

## 2014-11-05 LAB — SEDIMENTATION RATE: Sed Rate: 2 mm/hr (ref 0–32)

## 2014-11-05 LAB — ANGIOTENSIN CONVERTING ENZYME: Angio Convert Enzyme: 33 U/L (ref 14–82)

## 2014-11-05 LAB — VGCC ANTIBODY: VGCC Antibody: NEGATIVE

## 2014-11-05 LAB — RHEUMATOID FACTOR: RHEUMATOID FACTOR: 13.2 [IU]/mL (ref 0.0–13.9)

## 2014-11-05 LAB — CK: Total CK: 87 U/L (ref 24–173)

## 2014-11-05 LAB — ACETYLCHOLINE RECEPTOR, MODULATING

## 2014-11-05 MED ORDER — GADOBENATE DIMEGLUMINE 529 MG/ML IV SOLN
12.0000 mL | Freq: Once | INTRAVENOUS | Status: AC | PRN
  Administered 2014-11-05: 12 mL via INTRAVENOUS

## 2014-11-06 ENCOUNTER — Telehealth: Payer: Self-pay | Admitting: *Deleted

## 2014-11-06 NOTE — Telephone Encounter (Signed)
Called patient and left a message that labs were normal and MRI was unremarkable.

## 2014-11-06 NOTE — Telephone Encounter (Signed)
-----   Message from Anson Fret, MD sent at 11/05/2014  7:55 PM EST ----- Please let patient know that her labs were normal and the MRI of her brain was unremarkable. Thank you.

## 2014-11-13 ENCOUNTER — Ambulatory Visit (INDEPENDENT_AMBULATORY_CARE_PROVIDER_SITE_OTHER): Payer: Federal, State, Local not specified - PPO | Admitting: Neurology

## 2014-11-13 ENCOUNTER — Encounter (INDEPENDENT_AMBULATORY_CARE_PROVIDER_SITE_OTHER): Payer: Self-pay | Admitting: Radiology

## 2014-11-13 DIAGNOSIS — G609 Hereditary and idiopathic neuropathy, unspecified: Secondary | ICD-10-CM

## 2014-11-13 DIAGNOSIS — Z0289 Encounter for other administrative examinations: Secondary | ICD-10-CM

## 2014-11-13 DIAGNOSIS — G729 Myopathy, unspecified: Secondary | ICD-10-CM

## 2014-11-13 NOTE — Progress Notes (Signed)
  [facility]    Provider:  Dr Lucia Gaskins Referring Provider: Anson Fret, MD Primary Care Physician:  Rodney Langton, MD  HPI: Dawn [name] is a 33 y.o. female here as a referral from Dr. Benjamin Stain for arm pain and weakness  Patient has been having left shoulder pain for 6 months. She is having fine motor difficulties, especially when typing. Her arms feel very tired even when holding a book. The left side has shoulder pain, muscular sharp pain. She has radiation into the ulnar forearm and feels like she has to shake the hands out. Has numbness in the fingers most pronounced in left digit 5. Has weakness of grip. Wakes up in the middle of the night with pain, antsy feeling like she has to move her arms. Physical therapy helped some but the ibuprofen helps the most. No inciting factors. She has neck pain. Left is worse than the right. Has cramping in the legs. She has symptoms in the toes. Her arms get tired holding them over her head. She has a heavy feeling even in her arms. No difficulty getting out of low seats or climbing stairs but some perceived weakness in the legs. No double vision or vision changes, no ptosis, no SOB. No FHx of neuromuscular disease. Neuro exam only significant for distal decreased sensation pp and temp.   Summary  Nerve conduction studies were performed on the bilateral upper and bilateral lower extremities:  Bilateral Median motor nerves showed normal conductions with normal F Wave latency Bilateral  Ulnar motor nerves showed normal conductions with normal F Wave latency Bilateral  Peroneal motor nerves showed normal conductions with normal F Wave latency Bilateral  Tibial motor nerves showed normal conductions with normal F Wave latency Bilateral  second-digit Median sensory nerves were within normal limits Bilateral  fifth-digit Ulnar sensory nerves were within normal limits Bilateral  Sural sensory nerves were within normal  limits Bilateral H Reflexes showed normal latencies Bilateral Median/Ulnar (palm) comparison nerve showed normal peak latency difference  EMG Needle study was performed on selected left upper and left lower extremity muscles:   The Deltoid, Triceps, Biceps, Pronator Teres, Flexor Digitorum Profundus, Opponens Pollicis, First Dorsal interosseous, Vastus Medialis, Anterior Tibialis, Medial Gastrocnemius, Extensor Hallucis Longus and Abductor Hallucis muscles were within normal limits.  Conclusion: This is a normal study. No electrophysiologic evidence for ulnar or median neuropathy, peripheral polyneuropathy, cervical radiculopathy, NMJ disorder or myopathy.    Naomie Dean, MD  [facility] [address] [place], [place] [address]  Phone [phone] Fax [phone]

## 2014-12-10 ENCOUNTER — Ambulatory Visit (INDEPENDENT_AMBULATORY_CARE_PROVIDER_SITE_OTHER): Payer: Federal, State, Local not specified - PPO | Admitting: Sports Medicine

## 2014-12-10 ENCOUNTER — Encounter: Payer: Self-pay | Admitting: Sports Medicine

## 2014-12-10 VITALS — BP 114/74 | HR 86 | Ht 65.0 in | Wt 137.0 lb

## 2014-12-10 DIAGNOSIS — R200 Anesthesia of skin: Secondary | ICD-10-CM | POA: Diagnosis not present

## 2014-12-10 DIAGNOSIS — F32A Depression, unspecified: Secondary | ICD-10-CM

## 2014-12-10 DIAGNOSIS — R202 Paresthesia of skin: Principal | ICD-10-CM

## 2014-12-10 DIAGNOSIS — F329 Major depressive disorder, single episode, unspecified: Secondary | ICD-10-CM

## 2014-12-10 MED ORDER — DULOXETINE HCL 30 MG PO CPEP: 30.0000 mg | ORAL_CAPSULE | Freq: Every day | ORAL | Status: DC

## 2014-12-10 NOTE — Patient Instructions (Signed)
Down taper of Celexa, 10 mg daily for a week, at the same time we will start Cymbalta 30 mg daily. Increase to 60 mg in a month if no better.

## 2014-12-10 NOTE — Assessment & Plan Note (Signed)
Switching Celexa to Cymbalta. Down taper of Celexa, 10 mg daily for a week, at the same time we will start Cymbalta 30 mg daily. Increase to 60 mg in a month if no better.

## 2014-12-10 NOTE — Assessment & Plan Note (Signed)
At this point we have a negative cervical spine MRI, a negative brain MRI, nerve conduction and electromyography, and normal extensive blood work for peripheral neuropathy and myopathy. This point we are going to try and block symptoms with medication, I am going to switch her Celexa to Cymbalta. Return in one month.

## 2014-12-10 NOTE — Progress Notes (Signed)
  Subjective:    CC: Follow-up  HPI: Depression: Well controlled with Celexa. No suicidal or homicidal ideation.  Numbness and tingling in the left arm: C8 distribution, at this point Herbert SetaHeather has had an extensive workup including brain and cervical spine MRI, nerve conduction and electromyography, and physical therapy and NSAIDs. Ibuprofen has been the most effective for her pain, however she now describes symptoms is predominantly a heaviness in the left forearm.  She does understand that there is no procedure that can control her symptoms and that at this point with all of the above negative testing we will simply need to block her symptoms with medication.  Past medical history, Surgical history, Family history not pertinant except as noted below, Social history, Allergies, and medications have been entered into the medical record, reviewed, and no changes needed.   Review of Systems: No fevers, chills, night sweats, weight loss, chest pain, or shortness of breath.   Objective:    General: Well Developed, well nourished, and in no acute distress.  Neuro: Alert and oriented x3, extra-ocular muscles intact, sensation grossly intact.  HEENT: Normocephalic, atraumatic, pupils equal round reactive to light, neck supple, no masses, no lymphadenopathy, thyroid nonpalpable.  Skin: Warm and dry, no rashes. Cardiac: Regular rate and rhythm, no murmurs rubs or gallops, no lower extremity edema.  Respiratory: Clear to auscultation bilaterally. Not using accessory muscles, speaking in full sentences.  Impression and Recommendations:

## 2014-12-14 DIAGNOSIS — M5020 Other cervical disc displacement, unspecified cervical region: Secondary | ICD-10-CM

## 2014-12-14 HISTORY — DX: Other cervical disc displacement, unspecified cervical region: M50.20

## 2015-01-07 ENCOUNTER — Ambulatory Visit (INDEPENDENT_AMBULATORY_CARE_PROVIDER_SITE_OTHER): Payer: Federal, State, Local not specified - PPO | Admitting: Sports Medicine

## 2015-01-07 ENCOUNTER — Encounter: Payer: Self-pay | Admitting: Sports Medicine

## 2015-01-07 VITALS — BP 131/76 | HR 100 | Wt 141.0 lb

## 2015-01-07 DIAGNOSIS — F329 Major depressive disorder, single episode, unspecified: Secondary | ICD-10-CM

## 2015-01-07 DIAGNOSIS — F32A Depression, unspecified: Secondary | ICD-10-CM

## 2015-01-07 DIAGNOSIS — R200 Anesthesia of skin: Secondary | ICD-10-CM | POA: Diagnosis not present

## 2015-01-07 DIAGNOSIS — R202 Paresthesia of skin: Secondary | ICD-10-CM

## 2015-01-07 MED ORDER — DULOXETINE HCL 60 MG PO CPEP: 60.0000 mg | ORAL_CAPSULE | Freq: Every day | ORAL | Status: DC

## 2015-01-07 MED ORDER — AMITRIPTYLINE HCL 50 MG PO TABS: ORAL_TABLET | ORAL | Status: DC

## 2015-01-07 NOTE — Assessment & Plan Note (Signed)
Improve numbness and tingling in both hands, she does have a negative C-spine MRI, negative brain MRI, negative EMG and nerve conduction study and extensive blood work for peripheral neuropathy and myopathy has all been negative. Switched from Celexa to Cymbalta, and symptoms improved. Increasing to 60 mg of Cymbalta, she will try this out for several weeks, I'm also going to give a written prescription for amitriptyline which she can take if insufficient response to the increase in Cymbalta.  Return in 6 weeks.

## 2015-01-07 NOTE — Progress Notes (Signed)
  Subjective:    CC: Follow-up  HPI: Bilateral arm numbness: Negative nerve conduction, alert, and negative cervical spine MRI. Extensive blood work for secondary causes of neuropathy was also negative. We switched her from Celexa to Cymbalta at the last visit and symptoms have improved significantly. She still does have some nighttime symptoms. Mood is okay. No suicidal or homicidal ideation.  Past medical history, Surgical history, Family history not pertinant except as noted below, Social history, Allergies, and medications have been entered into the medical record, reviewed, and no changes needed.   Review of Systems: No fevers, chills, night sweats, weight loss, chest pain, or shortness of breath.   Objective:    General: Well Developed, well nourished, and in no acute distress.  Neuro: Alert and oriented x3, extra-ocular muscles intact, sensation grossly intact.  HEENT: Normocephalic, atraumatic, pupils equal round reactive to light, neck supple, no masses, no lymphadenopathy, thyroid nonpalpable.  Skin: Warm and dry, no rashes. Cardiac: Regular rate and rhythm, no murmurs rubs or gallops, no lower extremity edema.  Respiratory: Clear to auscultation bilaterally. Not using accessory muscles, speaking in full sentences.  Impression and Recommendations:

## 2015-01-25 ENCOUNTER — Encounter: Payer: Self-pay | Admitting: Neurology

## 2015-01-25 ENCOUNTER — Ambulatory Visit (INDEPENDENT_AMBULATORY_CARE_PROVIDER_SITE_OTHER): Payer: Federal, State, Local not specified - PPO | Admitting: Neurology

## 2015-01-25 ENCOUNTER — Other Ambulatory Visit: Payer: Federal, State, Local not specified - PPO

## 2015-01-25 VITALS — BP 123/74 | HR 93 | Ht 65.0 in | Wt 139.0 lb

## 2015-01-25 DIAGNOSIS — G609 Hereditary and idiopathic neuropathy, unspecified: Secondary | ICD-10-CM

## 2015-01-25 DIAGNOSIS — G729 Myopathy, unspecified: Secondary | ICD-10-CM

## 2015-01-25 DIAGNOSIS — M25461 Effusion, right knee: Secondary | ICD-10-CM

## 2015-01-25 DIAGNOSIS — R531 Weakness: Secondary | ICD-10-CM

## 2015-01-25 DIAGNOSIS — F32A Depression, unspecified: Secondary | ICD-10-CM

## 2015-01-25 DIAGNOSIS — F329 Major depressive disorder, single episode, unspecified: Secondary | ICD-10-CM

## 2015-01-25 DIAGNOSIS — M359 Systemic involvement of connective tissue, unspecified: Secondary | ICD-10-CM

## 2015-01-25 DIAGNOSIS — R202 Paresthesia of skin: Secondary | ICD-10-CM

## 2015-01-25 DIAGNOSIS — E538 Deficiency of other specified B group vitamins: Secondary | ICD-10-CM

## 2015-01-25 MED ORDER — DULOXETINE HCL 30 MG PO CPEP: 30.0000 mg | ORAL_CAPSULE | Freq: Every day | ORAL | Status: DC

## 2015-01-25 MED ORDER — PREGABALIN 50 MG PO CAPS: 50.0000 mg | ORAL_CAPSULE | Freq: Two times a day (BID) | ORAL | Status: DC

## 2015-01-25 NOTE — Progress Notes (Signed)
[facility]    Provider:  Dr Lucia Gaskins Referring Provider: Monica Becton Primary Care Physician:  Rodney Langton, MD  CC:  Pain behind knees  HPI:  Dawn [name] is a 34 y.o. female here as a follow up for multiple complaints of myalgias, arthralgias, weakness.  Extensive workup has been negative including imaging, labs and emg/ncs. Dr. Karie Schwalbe gave her Cymbalta which really helped at 30mg  but at the higher dose  is causing legs cramping and she has brain fogginess, she is tired. Today she feels bilateral pain and swelling behind both knees, points to the fossa.     Initial appointment 10/25/2014: Dawn [name] is a 34 y.o. female here as a referral from Dr. Benjamin Stain for arm pain and weakness  Patient has been having left shoulder pain for 6 months. She is having fine motor difficulties, especially when typing. Her arms feel very tired even when holding a book. The left side has shoulder pain, muscular sharp pain. She has radiation into the ulnar forearm and feels like she has to shake the hands out. Has numbness in the fingers most pronounced in left digit 5. Has weakness of grip. Wakes up in the middle of the night with pain, antsy feeling like she has to move her arms. Physical therapy helped some but the ibuprofen helps the most. No inciting factors. She has neck pain. Left is worse than the right. Has cramping in the legs. She has symptoms in the toes. Her arms get tired holding them over her head. She has a heavy feeling even in her arms. No difficulty getting out of low seats or climbing stairs but some perceived weakness in the legs. No double vision or vision changes, no ptosis, no SOB. No FHx of neuromuscular disease.   Reviewed notes, labs and imaging from outside physicians, which showed: personally reviewed images of the cervical spine which were negative for foraminal or spinal stenosis. HgbA1c, cbc and cmp unremarkable. TSH nml. Per notes she  has had physical therapy. She initially described more of a C5 distribution in the right shoulder but now a C8 distribution on the left. She is on Citalopram for depression which is helping. She has a history of goiter, aspiration biopsy showed benign goiter. History of overactive bladder  Review of Systems: Patient complains of symptoms per HPI as well as the following symptoms:fatigue, fever, light sensitivity, blurred vision, cold intolerance, flushing, dizziness, headache, numbness, weakness, abdominal pain, nausea, cough, SOB, trouble swallowing, leg swelling, insomnia, frequent awakening, joint swelling, back pain, aching muscles, itching, decr concentration. Pertinent negatives per HPI. All others negative.   History   Social History  . Marital Status: Married    Spouse Name: Trinna Post  . Number of Children: 1  . Years of Education: 12+   Occupational History  . [employer] of [employer]     Supervisor   Social History Main Topics  . Smoking status: Never Smoker   . Smokeless tobacco: Never Used  . Alcohol Use: 0.0 oz/week    0 Standard drinks or equivalent per week     Comment: 1-2 weekly  . Drug Use: No  . Sexual Activity: Yes    Birth Control/ Protection: IUD     Comment: Mirena - September 2012   Other Topics Concern  . Not on file   Social History Narrative   Patient lives at home with husband and daughter.    Patient is right handed   Patient has one child  Patient has a college degree.    Caffeine: Coffee 3-4 cups/day     Family History  Problem Relation Age of Onset  . Birth defects Maternal Grandmother   . Cancer Maternal Grandmother     Ovarian  . Birth defects Maternal Grandfather   . Cancer Maternal Grandfather   . Breast cancer Maternal Aunt 42  . Breast cancer Maternal Aunt 50  . Hyperlipidemia Father   . Hypertension Sister     Past Medical History  Diagnosis Date  . Thyroid goiter     benign  . Right tennis elbow     Past Surgical  History  Procedure Laterality Date  . Tonsillectomy  1986    Current Outpatient Prescriptions  Medication Sig Dispense Refill  . DULoxetine (CYMBALTA) 30 MG capsule Take 1 capsule (30 mg total) by mouth daily. 30 capsule 6  . ibuprofen (ADVIL,MOTRIN) 800 MG tablet Take 1 tablet (800 mg total) by mouth every 8 (eight) hours as needed. 60 tablet 2  . levonorgestrel (MIRENA) 20 MCG/24HR IUD 1 each by Intrauterine route once.    . tolterodine (DETROL LA) 4 MG 24 hr capsule Take 1 capsule (4 mg total) by mouth daily. 30 capsule 3  . amitriptyline (ELAVIL) 50 MG tablet One half tab PO qHS for a week, then one tab PO qHS. (Patient not taking: Reported on 01/25/2015) 90 tablet 3  . pregabalin (LYRICA) 50 MG capsule Take 1 capsule (50 mg total) by mouth 2 (two) times daily. 30 capsule 0   No current facility-administered medications for this visit.    Allergies as of 01/25/2015 - Review Complete 01/25/2015  Allergen Reaction Noted  . Latex Other (See Comments) 01/22/2011    Vitals: BP 123/74 mmHg  Pulse 93  Ht  (1.651 m)  Wt 139 lb (63.05 kg)  BMI 23.13 kg/m2 Last Weight:  Wt Readings from Last 1 Encounters:  01/25/15 139 lb (63.05 kg)   Last Height:   Ht Readings from Last 1 Encounters:  01/25/15  (1.651 m)   Physical exam: Exam: Gen: NAD, conversant, well nourised, obese, well groomed                     CV: RRR, no MRG. No Carotid Bruits. No peripheral edema, warm, nontender Eyes: Conjunctivae clear without exudates or hemorrhage  Neuro: Detailed Neurologic Exam  Speech:    Speech is normal; fluent and spontaneous with normal comprehension.  Cognition:    The patient is oriented to person, place, and time;     recent and remote memory intact;     language fluent;     normal attention, concentration,     fund of knowledge Cranial Nerves:    The pupils are equal, round, and reactive to light. The fundi are normal and spontaneous venous pulsations are present.  Visual fields are full to finger confrontation. Extraocular movements are intact. Trigeminal sensation is intact and the muscles of mastication are normal. The face is symmetric. The palate elevates in the midline. Hearing intact. Voice is normal. Shoulder shrug is normal. The tongue has normal motion without fasciculations.   Coordination:    Normal finger to nose and heel to shin. Normal rapid alternating movements.   Gait:    Heel-toe and tandem gait are normal.   Motor Observation:    No asymmetry, no atrophy, and no involuntary movements noted. Tone:    Normal muscle tone.    Posture:    Posture is normal.  normal erect    Strength:    Strength is V/V in the upper and lower limbs.      Sensation: intact to LT     Reflex Exam:  DTR's:    Deep tendon reflexes in the upper and lower extremities are normal bilaterally.   Toes:    The toes are downgoing bilaterally.   Clonus:    Clonus is absent.    Assessment/Plan:  34 year old patient with complaints of neck pain, radiation into the left ulnar forearm and digit 5, proximal weakness bilaterally upper>lower, paresthesias in all limbs, myalgias, arthralgias, pain behind the knees and multiple other complaints. Extensive w/u normal including labwork, emg/ncs, imaging. Will order US of the pop fossa however I suspect these complaints are largely psychological. Cymbalt  helped but  made her symptoms worse. Will decrease to  daily and can try Lyrica as well.   Naomie Dean, MD  [facility] [address] [place], [place] [address]  Phone [phone] Fax [phone]  A total of 30 minutes was spent face-to-face with this patient. Over half this time was spent on counseling patient on the myalgia,arthralgia and multiple other complaints and  different diagnostic and therapeutic options available.

## 2015-01-25 NOTE — Patient Instructions (Addendum)
Overall you are doing fairly well but I do want to suggest a few things today:   Remember to drink plenty of fluid, eat healthy meals and do not skip any meals. Try to eat protein with a every meal and eat a healthy snack such as fruit or nuts in between meals. Try to keep a regular sleep-wake schedule and try to exercise daily, particularly in the form of walking, 20-30 minutes a day, if you can.   As far as your medications are concerned, I would like to suggest: Lyrica 50mg  twice daily. If needed can increase to 100mg  twice daily. Can decrease Cymbalta to 30mg  daily,   I would like to see you back in 4-6 months, sooner if we need to. Please call us with any interim questions, concerns, problems, updates or refill requests.   Please also call us for any test results so we can go over those with you on the phone.  My clinical assistant and will answer any of your questions and relay your messages to me and also relay most of my messages to you.   Our phone number is [phone]. We also have an after hours call service for urgent matters and there is a physician on-call for urgent questions. For any emergencies you know to call 911 or go to the nearest emergency room

## 2015-01-28 ENCOUNTER — Other Ambulatory Visit: Payer: Federal, State, Local not specified - PPO

## 2015-01-28 ENCOUNTER — Telehealth: Payer: Self-pay

## 2015-01-28 NOTE — Telephone Encounter (Signed)
Patient called stated that she is improving on the Cymbalta 60 mg but she is having leg cramping, sleepiness and dizziness, patient wanted to know if she go back on her previous medication. Ariana Juul,CMA

## 2015-01-28 NOTE — Telephone Encounter (Signed)
Stick with it for now, keep hydrated, salt containing beverages like gatorade for now.  Push thru any sensations.  Also make sure she has stopped amitryptiline for now.

## 2015-01-29 NOTE — Telephone Encounter (Signed)
Left detailed message on patient mobile phone with instuctions noted below. Also advised patient to call me if she has additional questions. Rhonda Cunningham,CMA

## 2015-02-04 ENCOUNTER — Other Ambulatory Visit: Payer: Self-pay | Admitting: Neurology

## 2015-02-04 ENCOUNTER — Telehealth: Payer: Self-pay | Admitting: Neurology

## 2015-02-04 ENCOUNTER — Ambulatory Visit
Admission: RE | Admit: 2015-02-04 | Discharge: 2015-02-04 | Disposition: A | Discharge status: Home / Self Care | Payer: Federal, State, Local not specified - PPO | Source: Ambulatory Visit | Attending: Neurology | Admitting: Neurology

## 2015-02-04 DIAGNOSIS — M25461 Effusion, right knee: Secondary | ICD-10-CM

## 2015-02-04 NOTE — Telephone Encounter (Signed)
Called to discuss baker's cyst on US of the knee. Left message to call us back. Could not leave detailed information as the message was non specific and unsure who's VM it is. Thanks.

## 2015-02-07 NOTE — Telephone Encounter (Signed)
Patient is returning call. Patient has recently set voice mail up so it is ok to leave a message. [phone]

## 2015-02-11 ENCOUNTER — Telehealth: Payer: Self-pay | Admitting: Neurology

## 2015-02-11 NOTE — Telephone Encounter (Signed)
Left message for patient. Explained it appears she has a fluid collection in the popliteal fossa, likely a baker's cyst and I am happy to refer her to orthopaedics for further evaluation. Asked her to call back the office and let us know. Thanks.

## 2015-02-11 NOTE — Telephone Encounter (Signed)
Spoke to patient and relayed results. She will follow up with her pcp. thanks

## 2015-02-14 ENCOUNTER — Encounter: Payer: Self-pay | Admitting: Sports Medicine

## 2015-02-19 ENCOUNTER — Encounter: Payer: Self-pay | Admitting: Sports Medicine

## 2015-02-19 ENCOUNTER — Ambulatory Visit (INDEPENDENT_AMBULATORY_CARE_PROVIDER_SITE_OTHER): Payer: Federal, State, Local not specified - PPO | Admitting: Sports Medicine

## 2015-02-19 VITALS — BP 129/77 | HR 107 | Wt 140.0 lb

## 2015-02-19 DIAGNOSIS — M791 Myalgia: Secondary | ICD-10-CM

## 2015-02-19 DIAGNOSIS — M7120 Synovial cyst of popliteal space [Baker], unspecified knee: Secondary | ICD-10-CM | POA: Insufficient documentation

## 2015-02-19 DIAGNOSIS — M7121 Synovial cyst of popliteal space [Baker], right knee: Secondary | ICD-10-CM

## 2015-02-19 DIAGNOSIS — M7918 Myalgia, other site: Secondary | ICD-10-CM

## 2015-02-19 NOTE — Assessment & Plan Note (Signed)
With a negative C-spine MRI, brain MRI, nerve conduction, electromyography and blood work, this likely represents myofascial pain syndrome. She is done well with 30 of Cymbalta and starting Lyrica. Follow-up as needed for this.

## 2015-02-19 NOTE — Assessment & Plan Note (Signed)
Aspiration and injection as above. Return in a month for this.

## 2015-02-19 NOTE — Progress Notes (Signed)
  Subjective:    CC: Follow-up  HPI: Kiley is a pleasant 34 year old female with a history of depression, she had a long history of migratory paresthesias in both upper extremities, in various nerve distributions, ultimately her cervical spine MRI, brain MRI, nerve conduction, and electromyography were all completely negative, and an extensive workup for metabolic causes of peripheral neuropathy was also negative. We switched her from Celexa to Cymbalta and she improved significantly. She has since seen her neurologist, who added Lyrica, and she returns with resolution of all migratory paresthesias, this all suggests that her diagnosis is fibromyalgia/myofascial pain syndrome.  Baker's cyst: Noted behind the right knee for an ultrasound ordered by the neurologist, she was referred to me for further evaluation and treatment. She does endorse some tightness, and mild tenderness, no history of knee injuries. Symptoms are mild, persistent.  Past medical history, Surgical history, Family history not pertinant except as noted below, Social history, Allergies, and medications have been entered into the medical record, reviewed, and no changes needed.   Review of Systems: No fevers, chills, night sweats, weight loss, chest pain, or shortness of breath.   Objective:    General: Well Developed, well nourished, and in no acute distress.  Neuro: Alert and oriented x3, extra-ocular muscles intact, sensation grossly intact.  HEENT: Normocephalic, atraumatic, pupils equal round reactive to light, neck supple, no masses, no lymphadenopathy, thyroid nonpalpable.  Skin: Warm and dry, no rashes. Cardiac: Regular rate and rhythm, no murmurs rubs or gallops, no lower extremity edema.  Respiratory: Clear to auscultation bilaterally. Not using accessory muscles, speaking in full sentences. Right Knee: Normal to inspection with no erythema or effusion or obvious bony abnormalities. Palpation normal with no warmth  or joint line tenderness or patellar tenderness or condyle tenderness. Only minimal fullness at the crux of the gastrocnemius and the semimembranosus muscle. ROM normal in flexion and extension and lower leg rotation. Ligaments with solid consistent endpoints including ACL, PCL, LCL, MCL. Negative Mcmurray's and provocative meniscal tests. Non painful patellar compression. Patellar and quadriceps tendons unremarkable. Hamstring and quadriceps strength is normal.  Procedure: Real-time Ultrasound Guided aspiration/Injection of Baker's cyst, right knee Device: GE Logiq E  Verbal informed consent obtained.  Time-out conducted.  Noted no overlying erythema, induration, or other signs of local infection.  Skin prepped in a sterile fashion.  Local anesthesia: Topical Ethyl chloride.  With sterile technique and under real time ultrasound guidance:  18-gauge needle advanced into the visible Baker's cyst, I aspirated 10 mL of thick, straw-colored fluid, syringe was switched and 1 mL kenalog 40, 1 mL lidocaine injected easily. Completed without difficulty  Pain immediately resolved suggesting accurate placement of the medication.  Advised to call if fevers/chills, erythema, induration, drainage, or persistent bleeding.  Images permanently stored and available for review in the ultrasound unit.  Impression: Technically successful ultrasound guided injection.  Impression and Recommendations:

## 2015-02-21 ENCOUNTER — Ambulatory Visit: Payer: Federal, State, Local not specified - PPO | Admitting: Sports Medicine

## 2015-02-24 ENCOUNTER — Other Ambulatory Visit: Payer: Self-pay | Admitting: Sports Medicine

## 2015-02-25 NOTE — Telephone Encounter (Signed)
F/u around May

## 2015-03-20 ENCOUNTER — Ambulatory Visit (INDEPENDENT_AMBULATORY_CARE_PROVIDER_SITE_OTHER): Payer: Federal, State, Local not specified - PPO | Admitting: Sports Medicine

## 2015-03-20 VITALS — BP 127/78 | HR 99 | Ht 65.0 in | Wt 139.0 lb

## 2015-03-20 DIAGNOSIS — M791 Myalgia: Secondary | ICD-10-CM

## 2015-03-20 DIAGNOSIS — M7918 Myalgia, other site: Secondary | ICD-10-CM

## 2015-03-20 DIAGNOSIS — M7121 Synovial cyst of popliteal space [Baker], right knee: Secondary | ICD-10-CM

## 2015-03-20 MED ORDER — PREGABALIN 75 MG PO CAPS: 75.0000 mg | ORAL_CAPSULE | Freq: Two times a day (BID) | ORAL | Status: DC

## 2015-03-20 NOTE — Assessment & Plan Note (Signed)
Doing extremely well, occasionally gets some cramping at the 12 hour mark after using 50 mg of Lyrica, he are going to increase to 75 mg. 28 samples given today.

## 2015-03-20 NOTE — Assessment & Plan Note (Signed)
Complete resolution after drainage the last visit.

## 2015-03-20 NOTE — Progress Notes (Signed)
  Subjective:    CC: follow-up  HPI: Right leg Baker's cyst: Completely resolved after aspiration and injection.  Fibromyalgia: Continues to do extremely well with Cymbalta, would like to go up to 75 mg on Lyrica.  Past medical history, Surgical history, Family history not pertinant except as noted below, Social history, Allergies, and medications have been entered into the medical record, reviewed, and no changes needed.   Review of Systems: No fevers, chills, night sweats, weight loss, chest pain, or shortness of breath.   Objective:    General: Well Developed, well nourished, and in no acute distress.  Neuro: Alert and oriented x3, extra-ocular muscles intact, sensation grossly intact.  HEENT: Normocephalic, atraumatic, pupils equal round reactive to light, neck supple, no masses, no lymphadenopathy, thyroid nonpalpable.  Skin: Warm and dry, no rashes. Cardiac: Regular rate and rhythm, no murmurs rubs or gallops, no lower extremity edema.  Respiratory: Clear to auscultation bilaterally. Not using accessory muscles, speaking in full sentences. Right Knee: Normal to inspection with no erythema or effusion or obvious bony abnormalities. Palpation normal with no warmth or joint line tenderness or patellar tenderness or condyle tenderness. ROM normal in flexion and extension and lower leg rotation. Ligaments with solid consistent endpoints including ACL, PCL, LCL, MCL. Negative Mcmurray's and provocative meniscal tests. Non painful patellar compression. Patellar and quadriceps tendons unremarkable. Hamstring and quadriceps strength is normal.  Impression and Recommendations:

## 2015-03-26 ENCOUNTER — Other Ambulatory Visit: Payer: Self-pay | Admitting: Sports Medicine

## 2015-03-26 DIAGNOSIS — M7918 Myalgia, other site: Secondary | ICD-10-CM

## 2015-03-26 MED ORDER — PREGABALIN 75 MG PO CAPS: 75.0000 mg | ORAL_CAPSULE | Freq: Two times a day (BID) | ORAL | Status: DC

## 2015-05-20 ENCOUNTER — Encounter: Payer: Self-pay | Admitting: Sports Medicine

## 2015-05-20 DIAGNOSIS — Z803 Family history of malignant neoplasm of breast: Secondary | ICD-10-CM

## 2015-05-27 ENCOUNTER — Other Ambulatory Visit: Payer: Self-pay | Admitting: Sports Medicine

## 2015-05-27 ENCOUNTER — Ambulatory Visit
Admission: RE | Admit: 2015-05-27 | Discharge: 2015-05-27 | Disposition: A | Discharge status: Home / Self Care | Payer: Federal, State, Local not specified - PPO | Source: Ambulatory Visit | Attending: Sports Medicine | Admitting: Sports Medicine

## 2015-05-27 ENCOUNTER — Ambulatory Visit: Admission: RE | Admit: 2015-05-27 | Payer: Federal, State, Local not specified - PPO | Source: Ambulatory Visit

## 2015-05-27 ENCOUNTER — Encounter: Payer: Self-pay | Admitting: Sports Medicine

## 2015-05-27 DIAGNOSIS — Z803 Family history of malignant neoplasm of breast: Secondary | ICD-10-CM

## 2015-05-28 ENCOUNTER — Other Ambulatory Visit: Payer: Self-pay | Admitting: Sports Medicine

## 2015-06-04 ENCOUNTER — Encounter: Payer: Self-pay | Admitting: Sports Medicine

## 2015-06-04 ENCOUNTER — Ambulatory Visit (INDEPENDENT_AMBULATORY_CARE_PROVIDER_SITE_OTHER): Payer: Federal, State, Local not specified - PPO | Admitting: Sports Medicine

## 2015-06-04 VITALS — BP 122/80 | HR 123 | Ht 65.0 in | Wt 140.0 lb

## 2015-06-04 DIAGNOSIS — L723 Sebaceous cyst: Secondary | ICD-10-CM

## 2015-06-04 DIAGNOSIS — Z803 Family history of malignant neoplasm of breast: Secondary | ICD-10-CM

## 2015-06-04 DIAGNOSIS — I8390 Asymptomatic varicose veins of unspecified lower extremity: Secondary | ICD-10-CM

## 2015-06-04 DIAGNOSIS — I8393 Asymptomatic varicose veins of bilateral lower extremities: Secondary | ICD-10-CM

## 2015-06-04 DIAGNOSIS — M222X9 Patellofemoral disorders, unspecified knee: Secondary | ICD-10-CM | POA: Insufficient documentation

## 2015-06-04 DIAGNOSIS — M222X2 Patellofemoral disorders, left knee: Secondary | ICD-10-CM | POA: Diagnosis not present

## 2015-06-04 HISTORY — DX: Patellofemoral disorders, unspecified knee: M22.2X9

## 2015-06-04 HISTORY — DX: Asymptomatic varicose veins of unspecified lower extremity: I83.90

## 2015-06-04 NOTE — Assessment & Plan Note (Signed)
Compression hose.   

## 2015-06-04 NOTE — Assessment & Plan Note (Signed)
Sister undergoing BRCA screening. We will do yearly mammograms and ultrasounds.

## 2015-06-04 NOTE — Progress Notes (Signed)
  Subjective:    CC: Follow-up  HPI: Left knee pain: Present for a month now, localized to the knee, worse going up and down stairs, no mechanical symptoms.  Mass on leg: Sided, lower leg, minimally tender to palpation, no overlying erythema.  Soreness, inside of thigh: There is a palpable cord on the inside of thigh, minimally tender.  Past medical history, Surgical history, Family history not pertinant except as noted below, Social history, Allergies, and medications have been entered into the medical record, reviewed, and no changes needed.   Review of Systems: No fevers, chills, night sweats, weight loss, chest pain, or shortness of breath.   Objective:    General: Well Developed, well nourished, and in no acute distress.  Neuro: Alert and oriented x3, extra-ocular muscles intact, sensation grossly intact.  HEENT: Normocephalic, atraumatic, pupils equal round reactive to light, neck supple, no masses, no lymphadenopathy, thyroid nonpalpable.  Skin: Warm and dry, no rashes. Cardiac: Regular rate and rhythm, no murmurs rubs or gallops, no lower extremity edema.  Respiratory: Clear to auscultation bilaterally. Not using accessory muscles, speaking in full sentences. Left Knee: Normal to inspection with no erythema or effusion or obvious bony abnormalities. Tender to palpation under the medial patellar facet, positive painful patellar compression with relief of pain with medially directed force to the range of motion. ROM normal in flexion and extension and lower leg rotation. Ligaments with solid consistent endpoints including ACL, PCL, LCL, MCL. Negative Mcmurray's and provocative meniscal tests. Patellar and quadriceps tendons unremarkable. Very poor vastus medialis definition 1.1 cm sebaceous cyst palpable along the anterior left lower shin There is also a palpable cord along the medial thigh consistent with a superficial venous thrombosis.  Impression and Recommendations:     I spent 40 minutes with this patient, greater than 50% was face-to-face time counselling garding the above diagnosis

## 2015-06-04 NOTE — Assessment & Plan Note (Signed)
Left lower leg. Discussed options of surgical excision, injection, and watchful waiting.

## 2015-06-04 NOTE — Assessment & Plan Note (Signed)
Home rehabilitation exercises, return if no better in one month.

## 2015-06-17 ENCOUNTER — Encounter: Payer: Self-pay | Admitting: Sports Medicine

## 2015-06-26 ENCOUNTER — Other Ambulatory Visit: Payer: Self-pay | Admitting: Sports Medicine

## 2015-07-26 ENCOUNTER — Other Ambulatory Visit: Payer: Self-pay | Admitting: Sports Medicine

## 2015-07-28 ENCOUNTER — Other Ambulatory Visit: Payer: Self-pay | Admitting: Sports Medicine

## 2015-07-29 ENCOUNTER — Ambulatory Visit (INDEPENDENT_AMBULATORY_CARE_PROVIDER_SITE_OTHER): Payer: Federal, State, Local not specified - PPO | Admitting: Neurology

## 2015-07-29 ENCOUNTER — Encounter: Payer: Self-pay | Admitting: Neurology

## 2015-07-29 VITALS — BP 123/74 | HR 99 | Ht 65.0 in | Wt 142.8 lb

## 2015-07-29 DIAGNOSIS — M791 Myalgia, unspecified site: Secondary | ICD-10-CM

## 2015-07-29 DIAGNOSIS — M2550 Pain in unspecified joint: Secondary | ICD-10-CM

## 2015-07-29 DIAGNOSIS — R202 Paresthesia of skin: Secondary | ICD-10-CM | POA: Diagnosis not present

## 2015-07-29 MED ORDER — CYCLOBENZAPRINE HCL 10 MG PO TABS: 10.0000 mg | ORAL_TABLET | Freq: Every day | ORAL | Status: DC

## 2015-07-29 NOTE — Progress Notes (Signed)
[facility]    Provider:  Dr Lucia Gaskins Referring Provider: Monica Becton,* Primary Care Physician:  Rodney Langton, MD  CC: Pain behind knees  Interval follow up 07/29/2015: She is feeling better on the cymbalta and lyrica. At night her legs cramp a little bit. Her symptoms have improved. She has pain in the right shoulder, muscular. This is new. Started in the last week or two. She notices it more at night and worse with positional changes. No problems falling asleep or staying asleep. The issues with her left arm has improved. She still has cramping in the feet a little but has improved. She tried some flexeril in the past and didn't notice improvement. Can try Flexeril. No back pain.   Interval follow up 01/25/2015: Dawn [name] is a 34 y.o. female here as a follow up for multiple complaints of myalgias, arthralgias, weakness. Extensive workup has been negative including imaging, labs and emg/ncs. Dr. Karie Schwalbe gave her Cymbalta which really helped at 30mg  but at the higher dose is causing legs cramping and she has brain fogginess, she is tired. Today she feels bilateral pain and swelling behind both knees, points to the fossa.     Initial appointment 10/25/2014: Dawn [name] is a 34 y.o. female here as a referral from Dr. Benjamin Stain for arm pain and weakness  Patient has been having left shoulder pain for 6 months. She is having fine motor difficulties, especially when typing. Her arms feel very tired even when holding a book. The left side has shoulder pain, muscular sharp pain. She has radiation into the ulnar forearm and feels like she has to shake the hands out. Has numbness in the fingers most pronounced in left digit 5. Has weakness of grip. Wakes up in the middle of the night with pain, antsy feeling like she has to move her arms. Physical therapy helped some but the ibuprofen helps the most. No inciting factors. She has neck pain. Left is worse than  the right. Has cramping in the legs. She has symptoms in the toes. Her arms get tired holding them over her head. She has a heavy feeling even in her arms. No difficulty getting out of low seats or climbing stairs but some perceived weakness in the legs. No double vision or vision changes, no ptosis, no SOB. No FHx of neuromuscular disease.   Reviewed notes, labs and imaging from outside physicians, which showed: personally reviewed images of the cervical spine which were negative for foraminal or spinal stenosis. HgbA1c, cbc and cmp unremarkable. TSH nml. Per notes she has had physical therapy. She initially described more of a C5 distribution in the right shoulder but now a C8 distribution on the left. She is on Citalopram for depression which is helping. She has a history of goiter, aspiration biopsy showed benign goiter. History of overactive bladder  Review of Systems: Patient complains of symptoms per HPI as well as the following symptoms: trouble swallowing, heat intolerance, flushing, nausea, abdominal pain, daytime sleepiness, headache, weakness, agitation, depression. Pertinent negatives per HPI. All others negative.   Social History   Social History  . Marital Status: Married    Spouse Name: Trinna Post  . Number of Children: 1  . Years of Education: 12+   Occupational History  . [employer] of [employer]     Supervisor   Social History Main Topics  . Smoking status: Never Smoker   . Smokeless tobacco: Never Used  . Alcohol Use: 0.0 oz/week  0 Standard drinks or equivalent per week     Comment: 1-2 weekly  . Drug Use: No  . Sexual Activity: Yes    Birth Control/ Protection: IUD     Comment: Mirena - September 2012   Other Topics Concern  . Not on file   Social History Narrative   Patient lives at home with husband and daughter.    Patient is right handed   Patient has one child   Patient has a college degree.    Caffeine: Coffee 3-4 cups/day     Family History    Problem Relation Age of Onset  . Birth defects Maternal Grandmother   . Cancer Maternal Grandmother     Ovarian  . Birth defects Maternal Grandfather   . Cancer Maternal Grandfather   . Breast cancer Maternal Aunt 42  . Breast cancer Maternal Aunt 50  . Hyperlipidemia Father   . Hypertension Sister   . Breast cancer Sister     Past Medical History  Diagnosis Date  . Thyroid goiter     benign  . Right tennis elbow     Past Surgical History  Procedure Laterality Date  . Tonsillectomy  1986    Current Outpatient Prescriptions  Medication Sig Dispense Refill  . DULoxetine (CYMBALTA) 30 MG capsule Take 1 capsule (30 mg total) by mouth daily. 30 capsule 6  . ibuprofen (ADVIL,MOTRIN) 800 MG tablet Take 1 tablet (800 mg total) by mouth every 8 (eight) hours as needed. 60 tablet 2  . levonorgestrel (MIRENA) 20 MCG/24HR IUD 1 each by Intrauterine route once.    . pregabalin (LYRICA) 75 MG capsule Take 1 capsule (75 mg total) by mouth 2 (two) times daily. 60 capsule 3  . tolterodine (DETROL LA) 4 MG 24 hr capsule TAKE ONE CAPSULE BY MOUTH ONCE DAILY.  **FOLLOW-UP AROUND MAY** 30 capsule 0   No current facility-administered medications for this visit.    Allergies as of 07/29/2015 - Review Complete 07/29/2015  Allergen Reaction Noted  . Latex Other (See Comments) 01/22/2011    Vitals: BP 123/74 mmHg  Pulse 99  Ht 5\' 5"  (1.651 m)  Wt 142 lb 12.8 oz (64.774 kg)  BMI 23.76 kg/m2 Last Weight:  Wt Readings from Last 1 Encounters:  07/29/15 142 lb 12.8 oz (64.774 kg)   Last Height:   Ht Readings from Last 1 Encounters:  07/29/15 5\' 5"  (1.651 m)    Physical exam: Exam: Gen: NAD, conversant, well nourised, well groomed  CV: RRR, no MRG. No Carotid Bruits. No peripheral edema, warm, nontender Eyes: Conjunctivae clear without exudates or hemorrhage  Neuro: Detailed Neurologic Exam  Speech:  Speech is normal; fluent and spontaneous with normal  comprehension.  Cognition:  The patient is oriented to person, place, and time;   recent and remote memory intact;   language fluent;   normal attention, concentration,   fund of knowledge Cranial Nerves:  The pupils are equal, round, and reactive to light. Visual fields are full to finger confrontation. Extraocular movements are intact. Trigeminal sensation is intact and the muscles of mastication are normal. The face is symmetric. The palate elevates in the midline. Hearing intact. Voice is normal. Shoulder shrug is normal. The tongue has normal motion without fasciculations.    Motor Observation:  No asymmetry, no atrophy, and no involuntary movements noted. Tone:  Normal muscle tone.   Posture:  Posture is normal. normal erect   Strength:  Strength is V/V in the upper and lower limbs.  Assessment/Plan: 34 year old patient with complaints of neck pain, radiation into the left ulnar forearm and digit 5, proximal weakness bilaterally upper>lower, paresthesias in all limbs, myalgias, arthralgias, pain behind the knees and multiple other complaints. Extensive w/u normal including labwork, emg/ncs, imaging. She appears to be doing well. Continue current meds. Can add flexeril at night.   Naomie Dean, MD  [facility] [address] [place], [place] [address]  Phone [phone] Fax [phone]  A total of 15 minutes was spent face-to-face with this patient. Over half this time was spent on counseling patient on the myalgias and arthralgias, paresthesias diagnosis and different diagnostic and therapeutic options available.

## 2015-07-31 DIAGNOSIS — M2550 Pain in unspecified joint: Secondary | ICD-10-CM | POA: Insufficient documentation

## 2015-07-31 DIAGNOSIS — M791 Myalgia, unspecified site: Secondary | ICD-10-CM | POA: Insufficient documentation

## 2015-07-31 DIAGNOSIS — R202 Paresthesia of skin: Secondary | ICD-10-CM | POA: Insufficient documentation

## 2015-07-31 NOTE — Patient Instructions (Signed)
Overall you are doing fairly well but I do want to suggest a few things today:   Remember to drink plenty of fluid, eat healthy meals and do not skip any meals. Try to eat protein with a every meal and eat a healthy snack such as fruit or nuts in between meals. Try to keep a regular sleep-wake schedule and try to exercise daily, particularly in the form of walking, 20-30 minutes a day, if you can.   As far as your medications are concerned, I would like to suggest Add flexeril before bed as discussed  I would like to see you back in 6 months, sooner if we need to. Please call us with any interim questions, concerns, problems, updates or refill requests.   Please also call us for any test results so we can go over those with you on the phone.  My clinical assistant and will answer any of your questions and relay your messages to me and also relay most of my messages to you.   Our phone number is [phone]. We also have an after hours call service for urgent matters and there is a physician on-call for urgent questions. For any emergencies you know to call 911 or go to the nearest emergency room

## 2015-08-07 ENCOUNTER — Ambulatory Visit (INDEPENDENT_AMBULATORY_CARE_PROVIDER_SITE_OTHER): Payer: Federal, State, Local not specified - PPO | Admitting: Sports Medicine

## 2015-08-07 ENCOUNTER — Ambulatory Visit (INDEPENDENT_AMBULATORY_CARE_PROVIDER_SITE_OTHER): Payer: Federal, State, Local not specified - PPO

## 2015-08-07 VITALS — BP 117/71 | HR 79 | Temp 98.6°F | Ht 65.0 in | Wt 140.0 lb

## 2015-08-07 DIAGNOSIS — E010 Iodine-deficiency related diffuse (endemic) goiter: Secondary | ICD-10-CM

## 2015-08-07 DIAGNOSIS — E042 Nontoxic multinodular goiter: Secondary | ICD-10-CM

## 2015-08-07 DIAGNOSIS — J029 Acute pharyngitis, unspecified: Secondary | ICD-10-CM | POA: Diagnosis not present

## 2015-08-07 LAB — POCT RAPID STREP A (OFFICE): Rapid Strep A Screen: NEGATIVE

## 2015-08-07 NOTE — Assessment & Plan Note (Signed)
History of nontoxic multinodular goiter, patient has noted increasing swelling, and tenderness in the right neck. She does have a history of a thyroid nodule, biopsy was benign. Considering increasing in symptoms are going to repeat an ultrasound, as well as TFTs.

## 2015-08-07 NOTE — Progress Notes (Signed)
  Subjective:    CC: Right neck swelling  HPI: This is a pleasant 34 year old female, she has a history of a right thyroid mass, we did ultimately biopsied mass that showed a benign nontoxic multinodular goiter. She's done well, until recently where she has noted increasing swelling as well as pain on the right side of her neck. No upper respiratory or constitutional symptoms, no palpitations, she has mild dizziness. Symptoms are mild, persistent  Past medical history, Surgical history, Family history not pertinant except as noted below, Social history, Allergies, and medications have been entered into the medical record, reviewed, and no changes needed.   Review of Systems: No fevers, chills, night sweats, weight loss, chest pain, or shortness of breath.   Objective:    General: Well Developed, well nourished, and in no acute distress.  Neuro: Alert and oriented x3, extra-ocular muscles intact, sensation grossly intact.  HEENT: Normocephalic, atraumatic, pupils equal round reactive to light, neck supple, no masses, no lymphadenopathy, there is a subcentimeter palpable nodule in the right lobe of the thyroid gland. Oropharynx, nasopharynx, ear canals are unremarkable. Skin: Warm and dry, no rashes. Cardiac: Regular rate and rhythm, no murmurs rubs or gallops, no lower extremity edema.  Respiratory: Clear to auscultation bilaterally. Not using accessory muscles, speaking in full sentences.  Impression and Recommendations:    I spent 25 minutes with this patient, greater than 50% was face-to-face time counseling regarding the above diagnoses

## 2015-08-08 LAB — CBC WITH DIFFERENTIAL/PLATELET
Basophils Absolute: 0 K/uL (ref 0.0–0.1)
Basophils Relative: 0 % (ref 0–1)
Eosinophils Absolute: 0.1 10*3/uL (ref 0.0–0.7)
Eosinophils Relative: 2 % (ref 0–5)
HCT: 41.7 % (ref 36.0–46.0)
Hemoglobin: 13.5 g/dL (ref 12.0–15.0)
Lymphocytes Relative: 28 % (ref 12–46)
Lymphs Abs: 1.5 K/uL (ref 0.7–4.0)
MCH: 28.7 pg (ref 26.0–34.0)
MCHC: 32.4 g/dL (ref 30.0–36.0)
MCV: 88.5 fL (ref 78.0–100.0)
MPV: 10.2 fL (ref 8.6–12.4)
Monocytes Absolute: 0.5 K/uL (ref 0.1–1.0)
Monocytes Relative: 9 % (ref 3–12)
Neutro Abs: 3.4 K/uL (ref 1.7–7.7)
Neutrophils Relative %: 61 % (ref 43–77)
Platelets: 206 10*3/uL (ref 150–400)
RBC: 4.71 MIL/uL (ref 3.87–5.11)
RDW: 14 % (ref 11.5–15.5)
WBC: 5.5 K/uL (ref 4.0–10.5)

## 2015-08-08 LAB — TSH: TSH: 1.368 u[IU]/mL (ref 0.350–4.500)

## 2015-08-08 LAB — T4, FREE: Free T4: 1.05 ng/dL (ref 0.80–1.80)

## 2015-08-08 LAB — T3, FREE: T3, Free: 2.8 pg/mL (ref 2.3–4.2)

## 2015-08-22 ENCOUNTER — Other Ambulatory Visit: Payer: Self-pay | Admitting: Sports Medicine

## 2015-08-29 ENCOUNTER — Other Ambulatory Visit: Payer: Self-pay

## 2015-08-29 DIAGNOSIS — F32A Depression, unspecified: Secondary | ICD-10-CM

## 2015-08-29 DIAGNOSIS — F329 Major depressive disorder, single episode, unspecified: Secondary | ICD-10-CM

## 2015-08-29 MED ORDER — DULOXETINE HCL 30 MG PO CPEP: 30.0000 mg | ORAL_CAPSULE | Freq: Every day | ORAL | Status: DC

## 2015-09-01 ENCOUNTER — Other Ambulatory Visit: Payer: Self-pay | Admitting: Sports Medicine

## 2015-09-04 ENCOUNTER — Ambulatory Visit (INDEPENDENT_AMBULATORY_CARE_PROVIDER_SITE_OTHER): Payer: Federal, State, Local not specified - PPO | Admitting: Sports Medicine

## 2015-09-04 ENCOUNTER — Encounter: Payer: Self-pay | Admitting: Sports Medicine

## 2015-09-04 VITALS — BP 115/72 | HR 100 | Ht 65.0 in | Wt 144.0 lb

## 2015-09-04 DIAGNOSIS — M791 Myalgia: Secondary | ICD-10-CM

## 2015-09-04 DIAGNOSIS — E042 Nontoxic multinodular goiter: Secondary | ICD-10-CM

## 2015-09-04 DIAGNOSIS — M7918 Myalgia, other site: Secondary | ICD-10-CM

## 2015-09-04 MED ORDER — PREGABALIN 75 MG PO CAPS: ORAL_CAPSULE | ORAL | Status: DC

## 2015-09-04 NOTE — Assessment & Plan Note (Signed)
Doing extremely well, increasing Lyrica to 75 mg in the morning and 150 and bedtime.

## 2015-09-04 NOTE — Progress Notes (Signed)
  Subjective:    CC: Follow-up  HPI: Myofascial pain syndrome: Doing well on Lyrica twice a day, she does feel as though she needs a bit higher dose at night.    Nontoxic multinodular goiter: Benign on biopsy, most recent ultrasound shows dominant nodules are smaller than the one last year.  Past medical history, Surgical history, Family history not pertinant except as noted below, Social history, Allergies, and medications have been entered into the medical record, reviewed, and no changes needed.   Review of Systems: No fevers, chills, night sweats, weight loss, chest pain, or shortness of breath.   Objective:    General: Well Developed, well nourished, and in no acute distress.  Neuro: Alert and oriented x3, extra-ocular muscles intact, sensation grossly intact.  HEENT: Normocephalic, atraumatic, pupils equal round reactive to light, neck supple, no masses, no lymphadenopathy, thyroid nonpalpable.  Skin: Warm and dry, no rashes. Cardiac: Regular rate and rhythm, no murmurs rubs or gallops, no lower extremity edema.  Respiratory: Clear to auscultation bilaterally. Not using accessory muscles, speaking in full sentences.  Impression and Recommendations:   I spent 25 minutes with this patient, greater than 50% was face-to-face time counseling regarding the above diagnoses

## 2015-09-04 NOTE — Assessment & Plan Note (Signed)
Recent ultrasound is essentially unchanged from prior, dominant nodules are smaller. No further evaluation needed.

## 2015-09-19 ENCOUNTER — Ambulatory Visit: Payer: Federal, State, Local not specified - PPO | Admitting: Sports Medicine

## 2015-09-23 ENCOUNTER — Other Ambulatory Visit: Payer: Self-pay | Admitting: Sports Medicine

## 2015-10-27 ENCOUNTER — Other Ambulatory Visit: Payer: Self-pay | Admitting: Sports Medicine

## 2015-10-29 ENCOUNTER — Encounter: Payer: Self-pay | Admitting: Sports Medicine

## 2015-10-29 ENCOUNTER — Other Ambulatory Visit: Payer: Self-pay | Admitting: Neurology

## 2015-10-30 ENCOUNTER — Ambulatory Visit (INDEPENDENT_AMBULATORY_CARE_PROVIDER_SITE_OTHER): Payer: Federal, State, Local not specified - PPO | Admitting: Sports Medicine

## 2015-10-30 ENCOUNTER — Other Ambulatory Visit: Payer: Self-pay

## 2015-10-30 VITALS — BP 114/73 | HR 106 | Temp 98.1°F

## 2015-10-30 DIAGNOSIS — Z20818 Contact with and (suspected) exposure to other bacterial communicable diseases: Secondary | ICD-10-CM

## 2015-10-30 DIAGNOSIS — Z2089 Contact with and (suspected) exposure to other communicable diseases: Secondary | ICD-10-CM

## 2015-10-30 MED ORDER — PENICILLIN G BENZATHINE 1200000 UNIT/2ML IM SUSP
1.2000 10*6.[IU] | Freq: Once | INTRAMUSCULAR | Status: AC
  Administered 2015-10-30: 1.2 10*6.[IU] via INTRAMUSCULAR

## 2015-10-30 NOTE — Progress Notes (Signed)
   Subjective:    Patient ID: Dawn [name], female    DOB: March 01, 1981, 34 y.o.   MRN: [id number]  HPI  Dawn [name] has been exposed to strep throat. She reports having a headache and thickness feeling in throat. She would like a rapid strep test.   Review of Systems     Objective:   Physical Exam        Assessment & Plan:  Exposed to Strep - 1.2 million penicillin given. Patient tolerated injection well. RST was negative.

## 2015-11-04 ENCOUNTER — Other Ambulatory Visit: Payer: Self-pay | Admitting: Sports Medicine

## 2015-11-05 ENCOUNTER — Encounter: Payer: Self-pay | Admitting: Sports Medicine

## 2015-11-05 MED ORDER — DULOXETINE HCL 30 MG PO CPEP: 30.0000 mg | ORAL_CAPSULE | Freq: Every day | ORAL | Status: DC

## 2015-11-27 ENCOUNTER — Other Ambulatory Visit: Payer: Self-pay | Admitting: Sports Medicine

## 2015-12-11 ENCOUNTER — Ambulatory Visit (INDEPENDENT_AMBULATORY_CARE_PROVIDER_SITE_OTHER): Payer: Federal, State, Local not specified - PPO | Admitting: Sports Medicine

## 2015-12-11 ENCOUNTER — Encounter: Payer: Self-pay | Admitting: Sports Medicine

## 2015-12-11 ENCOUNTER — Ambulatory Visit (HOSPITAL_BASED_OUTPATIENT_CLINIC_OR_DEPARTMENT_OTHER)
Admission: RE | Admit: 2015-12-11 | Discharge: 2015-12-11 | Disposition: A | Discharge status: Home / Self Care | Payer: Federal, State, Local not specified - PPO | Source: Ambulatory Visit | Attending: Sports Medicine | Admitting: Sports Medicine

## 2015-12-11 VITALS — BP 119/79 | HR 84 | Temp 98.4°F | Resp 18 | Wt 146.4 lb

## 2015-12-11 DIAGNOSIS — R319 Hematuria, unspecified: Secondary | ICD-10-CM

## 2015-12-11 DIAGNOSIS — R0602 Shortness of breath: Secondary | ICD-10-CM | POA: Diagnosis not present

## 2015-12-11 DIAGNOSIS — R109 Unspecified abdominal pain: Secondary | ICD-10-CM | POA: Diagnosis present

## 2015-12-11 LAB — CBC WITH DIFFERENTIAL/PLATELET
Basophils Absolute: 0 K/uL (ref 0.0–0.1)
Basophils Relative: 0 % (ref 0–1)
Eosinophils Absolute: 0.2 K/uL (ref 0.0–0.7)
Eosinophils Relative: 3 % (ref 0–5)
HCT: 41.5 % (ref 36.0–46.0)
Hemoglobin: 14.3 g/dL (ref 12.0–15.0)
Lymphocytes Relative: 31 % (ref 12–46)
Lymphs Abs: 1.9 10*3/uL (ref 0.7–4.0)
MCH: 29.1 pg (ref 26.0–34.0)
MCHC: 34.5 g/dL (ref 30.0–36.0)
MCV: 84.3 fL (ref 78.0–100.0)
MPV: 9.5 fL (ref 8.6–12.4)
Monocytes Absolute: 0.5 10*3/uL (ref 0.1–1.0)
Monocytes Relative: 9 % (ref 3–12)
Neutro Abs: 3.5 10*3/uL (ref 1.7–7.7)
Neutrophils Relative %: 57 % (ref 43–77)
Platelets: 199 K/uL (ref 150–400)
RBC: 4.92 MIL/uL (ref 3.87–5.11)
RDW: 13.9 % (ref 11.5–15.5)
WBC: 6.1 K/uL (ref 4.0–10.5)

## 2015-12-11 LAB — COMPREHENSIVE METABOLIC PANEL WITH GFR
ALT: 8 U/L (ref 6–29)
AST: 22 U/L (ref 10–30)
Alkaline Phosphatase: 48 U/L (ref 33–115)
BUN: 12 mg/dL (ref 7–25)
Glucose, Bld: 95 mg/dL (ref 65–99)
Potassium: 4.4 mmol/L (ref 3.5–5.3)

## 2015-12-11 LAB — COMPREHENSIVE METABOLIC PANEL
Albumin: 4.7 g/dL (ref 3.6–5.1)
CO2: 30 mmol/L (ref 20–31)
Calcium: 9.4 mg/dL (ref 8.6–10.2)
Chloride: 101 mmol/L (ref 98–110)
Creat: 0.63 mg/dL (ref 0.50–1.10)
Sodium: 137 mmol/L (ref 135–146)
Total Bilirubin: 0.3 mg/dL (ref 0.2–1.2)
Total Protein: 6.6 g/dL (ref 6.1–8.1)

## 2015-12-11 LAB — D-DIMER, QUANTITATIVE: D-Dimer, Quant: 0.42 ug/mL-FEU (ref 0.00–0.48)

## 2015-12-11 NOTE — Progress Notes (Signed)
  Subjective:    CC: Shortness of breath and flank pain  HPI: The past several days this pleasant and healthy 34 year old female has had increasing occasional catching her breath without chest pain, as well as right flank pain, with occasional radiation to the anterior abdomen. No dysuria, visible hematuria, no urgency or frequency. Mild fevers and chills. No recent long drives her periods of immobilization. Symptoms are moderate, persistent.  Past medical history, Surgical history, Family history not pertinant except as noted below, Social history, Allergies, and medications have been entered into the medical record, reviewed, and no changes needed.   Review of Systems: No fevers, chills, night sweats, weight loss, chest pain, or shortness of breath.   Objective:    General: Well Developed, well nourished, and in no acute distress.  Neuro: Alert and oriented x3, extra-ocular muscles intact, sensation grossly intact.  HEENT: Normocephalic, atraumatic, pupils equal round reactive to light, neck supple, no masses, no lymphadenopathy, thyroid nonpalpable.  Skin: Warm and dry, no rashes. Cardiac: Regular rate and rhythm, no murmurs rubs or gallops, no lower extremity edema.  Respiratory: Clear to auscultation bilaterally. Not using accessory muscles, speaking in full sentences. Abdomen: Soft, nontender, nondistended, normal bowel sounds, no palpable masses, mild right costovertebral angle pain.  Urinalysis positive for trace blood.  Impression and Recommendations:

## 2015-12-11 NOTE — Assessment & Plan Note (Signed)
With some degree of flank pain, and constitutional symptoms, checking a urinalysis. We also need a chest x-ray, d-dimer, and a CBC.

## 2015-12-12 ENCOUNTER — Encounter: Payer: Self-pay | Admitting: Sports Medicine

## 2015-12-16 ENCOUNTER — Encounter: Payer: Self-pay | Admitting: Sports Medicine

## 2015-12-18 ENCOUNTER — Other Ambulatory Visit: Payer: Self-pay | Admitting: Sports Medicine

## 2015-12-19 ENCOUNTER — Ambulatory Visit (INDEPENDENT_AMBULATORY_CARE_PROVIDER_SITE_OTHER): Payer: Federal, State, Local not specified - PPO | Admitting: Sports Medicine

## 2015-12-19 VITALS — BP 109/73 | HR 93 | Resp 16

## 2015-12-19 DIAGNOSIS — R0602 Shortness of breath: Secondary | ICD-10-CM | POA: Diagnosis not present

## 2015-12-19 DIAGNOSIS — M7671 Peroneal tendinitis, right leg: Secondary | ICD-10-CM | POA: Diagnosis not present

## 2015-12-19 MED ORDER — POLYETHYLENE GLYCOL 3350 17 G PO PACK: 17.0000 g | PACK | Freq: Two times a day (BID) | ORAL | Status: DC

## 2015-12-19 NOTE — Assessment & Plan Note (Signed)
Negative d-dimer, chest x-ray, pre-and postbronchodilator PFTs were normal today. Surface of breath is likely related to increased stool burden seen on CT scan as well as deconditioning. Adding MiraLAX. Advised her that there were no limitations for physical activity.

## 2015-12-19 NOTE — Progress Notes (Signed)
  Subjective:    CC: PFTs  HPI: Dawn [name] returns for her vague shortness of breath, she had a negative chest x-ray, negative d-dimer, exam was unremarkable, she did have a high stool burden on her abdominal pelvic CT.  Foot pain: Right-sided, present for weeks, localized behind and distal to the lateral malleolus, no trauma. Has been increasing her walking.  Past medical history, Surgical history, Family history not pertinant except as noted below, Social history, Allergies, and medications have been entered into the medical record, reviewed, and no changes needed.   Review of Systems: No fevers, chills, night sweats, weight loss, chest pain, or shortness of breath.   Objective:    General: Well Developed, well nourished, and in no acute distress.  Neuro: Alert and oriented x3, extra-ocular muscles intact, sensation grossly intact.  HEENT: Normocephalic, atraumatic, pupils equal round reactive to light, neck supple, no masses, no lymphadenopathy, thyroid nonpalpable.  Skin: Warm and dry, no rashes. Cardiac: Regular rate and rhythm, no murmurs rubs or gallops, no lower extremity edema.  Respiratory: Clear to auscultation bilaterally. Not using accessory muscles, speaking in full sentences. Right Ankle: No visible erythema or swelling. Range of motion is full in all directions. Strength is 5/5 in all directions. Stable lateral and medial ligaments; squeeze test and kleiger test unremarkable; Talar dome nontender; No pain at base of 5th MT; No tenderness over cuboid; No tenderness over N spot or navicular prominence No tenderness on posterior aspects of lateral and medial malleolus Tender to palpation over the peroneal's distally. Negative tarsal tunnel tinel's Able to walk 4 steps.  PFTs are completely unremarkable  Procedure: Real-time Ultrasound Guided Injection of peroneal tendon sheath Device: GE Logiq E  Verbal informed consent obtained.  Time-out conducted.  Noted no  overlying erythema, induration, or other signs of local infection.  Skin prepped in a sterile fashion.  Local anesthesia: Topical Ethyl chloride.  With sterile technique and under real time ultrasound guidance:  25-gauge needle advanced into the distal peroneal tendon sheath, noted tendon sheath effusion, 1 mL kenalog 40, 1 mL lidocaine injected easily. Completed without difficulty  Pain immediately resolved suggesting accurate placement of the medication.  Advised to call if fevers/chills, erythema, induration, drainage, or persistent bleeding.  Images permanently stored and available for review in the ultrasound unit.  Impression: Technically successful ultrasound guided injection.  Impression and Recommendations:

## 2015-12-19 NOTE — Assessment & Plan Note (Signed)
Injection as above, rehabilitation exercises. Return in one month.

## 2016-01-16 ENCOUNTER — Encounter: Payer: Self-pay | Admitting: Sports Medicine

## 2016-01-16 ENCOUNTER — Ambulatory Visit (INDEPENDENT_AMBULATORY_CARE_PROVIDER_SITE_OTHER): Payer: Federal, State, Local not specified - PPO | Admitting: Sports Medicine

## 2016-01-16 VITALS — BP 113/76 | HR 96 | Resp 16 | Wt 144.6 lb

## 2016-01-16 DIAGNOSIS — M7671 Peroneal tendinitis, right leg: Secondary | ICD-10-CM

## 2016-01-16 NOTE — Assessment & Plan Note (Signed)
Greatly improved after injection at the last visit. Still has a bit of pain distally at the peroneus brevis. Ultrasound did show a bit of residual fluid in the tendon sheath. We will simply watch and wait.

## 2016-01-16 NOTE — Progress Notes (Addendum)
  Subjective:    CC: follow-up  HPI: Right peroneal tendinitis: Injected the peroneal tendon sheath month ago, she returns today with pain almost completely resolved, she has a bit of residual pain distal to the lateral malleolus over the peroneus brevis however swelling has resolved and function is normal.  Past medical history, Surgical history, Family history not pertinant except as noted below, Social history, Allergies, and medications have been entered into the medical record, reviewed, and no changes needed.   Review of Systems: No fevers, chills, night sweats, weight loss, chest pain, or shortness of breath.   Objective:    General: Well Developed, well nourished, and in no acute distress.  Neuro: Alert and oriented x3, extra-ocular muscles intact, sensation grossly intact.  HEENT: Normocephalic, atraumatic, pupils equal round reactive to light, neck supple, no masses, no lymphadenopathy, thyroid nonpalpable.  Skin: Warm and dry, no rashes. Cardiac: Regular rate and rhythm, no murmurs rubs or gallops, no lower extremity edema.  Respiratory: Clear to auscultation bilaterally. Not using accessory muscles, speaking in full sentences. Right Ankle: No visible erythema or swelling. Range of motion is full in all directions. Strength is 5/5 in all directions. Stable lateral and medial ligaments; squeeze test and kleiger test unremarkable; Talar dome nontender; No pain at base of 5th MT; No tenderness over cuboid; No tenderness over N spot or navicular prominence No tenderness on posterior aspects of lateral and medial malleolus No sign of peroneal tendon subluxations;she has a bit of residual pain just distal to the lateral malleolus over the peroneus brevis Negative tarsal tunnel tinel's Able to walk 4 steps.  Procedure: Diagnostic Ultrasound of  right ankle Device: GE Logiq E  Findings: Noted Halo sign around the peroneus brevis Images permanently stored and available for review  in the ultrasound unit.  Impression: Minimal residual peroneus brevis tendinitis  Impression and Recommendations:

## 2016-01-29 ENCOUNTER — Ambulatory Visit (INDEPENDENT_AMBULATORY_CARE_PROVIDER_SITE_OTHER): Payer: Federal, State, Local not specified - PPO | Admitting: Adult Health

## 2016-01-29 ENCOUNTER — Encounter: Payer: Self-pay | Admitting: Neurology

## 2016-01-29 ENCOUNTER — Other Ambulatory Visit: Payer: Self-pay | Admitting: Sports Medicine

## 2016-01-29 VITALS — BP 128/81 | HR 94 | Temp 97.5°F | Ht 65.0 in | Wt 149.2 lb

## 2016-01-29 DIAGNOSIS — M791 Myalgia, unspecified site: Secondary | ICD-10-CM

## 2016-01-29 DIAGNOSIS — M2550 Pain in unspecified joint: Secondary | ICD-10-CM | POA: Diagnosis not present

## 2016-01-29 NOTE — Progress Notes (Addendum)
PATIENT: Dawn [name] DOB: Feb 23, 1981  REASON FOR VISIT: follow up- muscle aches HISTORY FROM: patient  HISTORY OF PRESENT ILLNESS: Interval follow up 01/29/16: Dawn [name] is a 35 year old female with a history of muscle aches. She returns today for follow-up. The patient remains on Cymbalta and Lyrica. She states that this has been very beneficial for her symptoms. She states occasionally she'll have discomfort in the arms but it usually resolves pretty quickly. She also sees a myofascial specialist at least 3 times a month and she finds these visits beneficial. She states occasionally she'll have tightness in the back of the legs but this is intermittent and has been rare  recently. The patient was given Flexeril however she has not needed this medication. She denies any new neurological symptoms. She returns today for an evaluation.  HISTORY  Interval follow up 07/29/2015: She is feeling better on the cymbalta and lyrica. At night her legs cramp a little bit. Her symptoms have improved. She has pain in the right shoulder, muscular. This is new. Started in the last week or two. She notices it more at night and worse with positional changes. No problems falling asleep or staying asleep. The issues with her left arm has improved. She still has cramping in the feet a little but has improved. She tried some flexeril in the past and didn't notice improvement. Can try Flexeril. No back pain.   Interval follow up 01/25/2015: Dawn [name] is a 35 y.o. female here as a follow up for multiple complaints of myalgias, arthralgias, weakness. Extensive workup has been negative including imaging, labs and emg/ncs. Dr. Karie Schwalbe gave her Cymbalta which really helped at 30mg  but at the higher dose is causing legs cramping and she has brain fogginess, she is tired. Today she feels bilateral pain and swelling behind both knees, points to the fossa.   Initial appointment 10/25/2014: Dawn [name] is a 35 y.o.  female here as a referral from Dr. Benjamin Stain for arm pain and weakness  Patient has been having left shoulder pain for 6 months. She is having fine motor difficulties, especially when typing. Her arms feel very tired even when holding a book. The left side has shoulder pain, muscular sharp pain. She has radiation into the ulnar forearm and feels like she has to shake the hands out. Has numbness in the fingers most pronounced in left digit 5. Has weakness of grip. Wakes up in the middle of the night with pain, antsy feeling like she has to move her arms. Physical therapy helped some but the ibuprofen helps the most. No inciting factors. She has neck pain. Left is worse than the right. Has cramping in the legs. She has symptoms in the toes. Her arms get tired holding them over her head. She has a heavy feeling even in her arms. No difficulty getting out of low seats or climbing stairs but some perceived weakness in the legs. No double vision or vision changes, no ptosis, no SOB. No FHx of neuromuscular disease.   Reviewed notes, labs and imaging from outside physicians, which showed: personally reviewed images of the cervical spine which were negative for foraminal or spinal stenosis. HgbA1c, cbc and cmp unremarkable. TSH nml. Per notes she has had physical therapy. She initially described more of a C5 distribution in the right shoulder but now a C8 distribution on the left. She is on Citalopram for depression which is helping. She has a history of goiter, aspiration biopsy showed benign  goiter. History of overactive bladder    REVIEW OF SYSTEMS: Out of a complete 14 system review of symptoms, the patient complains only of the following symptoms, and all other reviewed systems are negative.  ALLERGIES: Allergies  Allergen Reactions  . Latex Other (See Comments)    Reaction: slight irritation    HOME MEDICATIONS: Outpatient Prescriptions Prior to Visit  Medication Sig Dispense Refill  .  cyclobenzaprine (FLEXERIL) 10 MG tablet Take 1 tablet (10 mg total) by mouth at bedtime. 30 tablet 6  . DULoxetine (CYMBALTA) 30 MG capsule Take 1 capsule (30 mg total) by mouth daily. 90 capsule 3  . ibuprofen (ADVIL,MOTRIN) 800 MG tablet TAKE ONE TABLET BY MOUTH EVERY 8 HOURS AS NEEDED 60 tablet 0  . levonorgestrel (MIRENA) 20 MCG/24HR IUD 1 each by Intrauterine route once.    Marland Kitchen LYRICA 75 MG capsule TAKE ONE CAPSULE BY MOUTH IN THE MORNING AND TAKE TWO CAPSULES BY MOUTH AT BEDTIME 90 capsule 0  . polyethylene glycol (MIRALAX / GLYCOLAX) packet Take 17 g by mouth 2 (two) times daily. Until stooling regularly 30 packet 11  . tolterodine (DETROL LA) 4 MG 24 hr capsule TAKE ONE CAPSULE BY MOUTH ONCE DAILY 30 capsule 0   No facility-administered medications prior to visit.    PAST MEDICAL HISTORY: Past Medical History  Diagnosis Date  . Thyroid goiter     benign  . Right tennis elbow     PAST SURGICAL HISTORY: Past Surgical History  Procedure Laterality Date  . Tonsillectomy  1986    FAMILY HISTORY: Family History  Problem Relation Age of Onset  . Birth defects Maternal Grandmother   . Cancer Maternal Grandmother     Ovarian  . Birth defects Maternal Grandfather   . Cancer Maternal Grandfather   . Breast cancer Maternal Aunt 42  . Breast cancer Maternal Aunt 50  . Hyperlipidemia Father   . Hypertension Sister   . Breast cancer Sister     SOCIAL HISTORY: Social History   Social History  . Marital Status: Married    Spouse Name: Trinna Post  . Number of Children: 1  . Years of Education: 12+   Occupational History  . [employer] of [employer]     Supervisor   Social History Main Topics  . Smoking status: Never Smoker   . Smokeless tobacco: Never Used  . Alcohol Use: 0.0 oz/week    0 Standard drinks or equivalent per week     Comment: 1-2 weekly  . Drug Use: No  . Sexual Activity: Yes    Birth Control/ Protection: IUD     Comment: Mirena - September 2012    Other Topics Concern  . Not on file   Social History Narrative   Patient lives at home with husband and daughter.    Patient is right handed   Patient has one child   Patient has a college degree.    Caffeine: Coffee 3-4 cups/day       PHYSICAL EXAM  Filed Vitals:   01/29/16 1425  BP: 128/81  Pulse: 94  Temp: 97.5 F (36.4 C)  TempSrc: Oral  Height:  (1.651 m)  Weight: 149 lb 3.2 oz (67.677 kg)   Body mass index is 24.83 kg/(m^2).  Generalized: Well developed, in no acute distress   Neurological examination  Mentation: Alert oriented to time, place, history taking. Follows all commands speech and language fluent Cranial nerve II-XII: Pupils were equal round reactive to light. Extraocular movements were  full, visual field were full on confrontational test. Facial sensation and strength were normal. Uvula tongue midline. Head turning and shoulder shrug  were normal and symmetric. Motor: The motor testing reveals 5 over 5 strength of all 4 extremities. Good symmetric motor tone is noted throughout.  Sensory: Sensory testing is intact to soft touch on all 4 extremities. No evidence of extinction is noted.  Coordination: Cerebellar testing reveals good finger-nose-finger and heel-to-shin bilaterally.  Gait and station: Gait is normal. Tandem gait is normal. Romberg is negative. No drift is seen.  Reflexes: Deep tendon reflexes are symmetric and normal bilaterally.   DIAGNOSTIC DATA (LABS, IMAGING, TESTING) - I reviewed patient records, labs, notes, testing and imaging myself where available.  Lab Results  Component Value Date   WBC 6.1 12/11/2015   HGB 14.3 12/11/2015   HCT 41.5 12/11/2015   MCV 84.3 12/11/2015   PLT 199 12/11/2015      Component Value Date/Time   NA 137 12/11/2015 1024   K 4.4 12/11/2015 1024   CL 101 12/11/2015 1024   CO2 30 12/11/2015 1024   GLUCOSE 95 12/11/2015 1024   BUN 12 12/11/2015 1024   CREATININE 0.63 12/11/2015 1024   CALCIUM  9.4 12/11/2015 1024   PROT 6.6 12/11/2015 1024   PROT 7.2 10/25/2014 0940   ALBUMIN 4.7 12/11/2015 1024   AST 22 12/11/2015 1024   ALT 8 12/11/2015 1024   ALKPHOS 48 12/11/2015 1024   BILITOT 0.3 12/11/2015 1024   ASSESSMENT AND PLAN 35 y.o. year old female  has a past medical history of Thyroid goiter and Right tennis elbow. here with:  1. Arthralgia  2. Myalgia  Overall the patient is doing well. She will continue on Cymbalta and Lyrica. Patient advised that if her symptoms worsen or she develops any new symptoms she should let us know. She will follow-up in 6 months with Dr. Lucia Gaskins.   Butch Penny, MSN, NP-C 01/29/2016, 3:01 PM [facility] [address] [place], [place] [address] [phone]    Personally examined images, and have participated in and made any corrections needed to history, physical, neuro exam,assessment and plan as stated above.  I have personally obtained the history, evaluated lab date, reviewed imaging studies and agree with radiology interpretations.    Naomie Dean, MD [facility]

## 2016-01-29 NOTE — Patient Instructions (Signed)
Continue Cymbalta and lyrica If your symptoms worsen or you develop new symptoms please let us know.

## 2016-02-12 ENCOUNTER — Encounter: Payer: Self-pay | Admitting: *Deleted

## 2016-02-12 ENCOUNTER — Emergency Department
Admission: EM | Admit: 2016-02-12 | Discharge: 2016-02-12 | Disposition: A | Discharge status: Home / Self Care | Payer: Federal, State, Local not specified - PPO | Source: Home / Self Care | Attending: Family Medicine | Admitting: Family Medicine

## 2016-02-12 DIAGNOSIS — J069 Acute upper respiratory infection, unspecified: Secondary | ICD-10-CM | POA: Diagnosis not present

## 2016-02-12 DIAGNOSIS — H6591 Unspecified nonsuppurative otitis media, right ear: Secondary | ICD-10-CM | POA: Diagnosis not present

## 2016-02-12 DIAGNOSIS — H6505 Acute serous otitis media, recurrent, left ear: Secondary | ICD-10-CM | POA: Diagnosis not present

## 2016-02-12 MED ORDER — AMOXICILLIN-POT CLAVULANATE 875-125 MG PO TABS: 1.0000 | ORAL_TABLET | Freq: Two times a day (BID) | ORAL | Status: DC

## 2016-02-12 MED ORDER — PREDNISONE 20 MG PO TABS: ORAL_TABLET | ORAL | Status: DC

## 2016-02-12 NOTE — ED Notes (Signed)
Pt c/o nonproductive cough, chills, and bilateral ear pain x 2 days. Hx of otitis media. Denies fever.

## 2016-02-12 NOTE — ED Provider Notes (Signed)
CSN: [id number]     Arrival date & time 02/12/16  1650 History   First MD Initiated Contact with Patient 02/12/16 1710     Chief Complaint  Patient presents with  . Cough   (Consider location/radiation/quality/duration/timing/severity/associated sxs/prior Treatment) HPI  The pt is a 35yo female presenting to [facility] with c/o 2 days of worsening nasal congestion bilateral alternating ear pain and pressure, and mild intermittent non-productive cough.  She has a hx of otitis media and states she is leaving on a plane in a few days for a business trip and hopes to be better by then.  She has been taking OTC cough/cold medication with only minimal relief. Denies fever but has had chills. Denies n/v/d.   Past Medical History  Diagnosis Date  . Thyroid goiter     benign  . Right tennis elbow    Past Surgical History  Procedure Laterality Date  . Tonsillectomy  1986   Family History  Problem Relation Age of Onset  . Birth defects Maternal Grandmother   . Cancer Maternal Grandmother     Ovarian  . Birth defects Maternal Grandfather   . Cancer Maternal Grandfather   . Breast cancer Maternal Aunt 42  . Breast cancer Maternal Aunt 50  . Hyperlipidemia Father   . Hypertension Sister   . Breast cancer Sister    Social History  Substance Use Topics  . Smoking status: Never Smoker   . Smokeless tobacco: Never Used  . Alcohol Use: 0.0 oz/week    0 Standard drinks or equivalent per week     Comment: 1-2 weekly   OB History    Gravida Para Term Preterm AB TAB SAB Ectopic Multiple Living   Review of Systems  Constitutional: Positive for chills. Negative for fever.  HENT: Positive for congestion, ear pain and sore throat. Negative for trouble swallowing and voice change.   Respiratory: Positive for cough. Negative for shortness of breath.   Cardiovascular: Negative for chest pain and palpitations.  Gastrointestinal: Negative for nausea, vomiting, abdominal pain and  diarrhea.  Musculoskeletal: Positive for myalgias and arthralgias. Negative for back pain.  Skin: Negative for rash.  Neurological: Positive for dizziness and headaches. Negative for light-headedness.    Allergies  Latex  Home Medications   Prior to Admission medications   Medication Sig Start Date End Date Taking? Authorizing Provider  amoxicillin-clavulanate (AUGMENTIN) 875-125 MG tablet Take 1 tablet by mouth 2 (two) times daily. One po bid x 7 days 02/12/16   Junius Finner, PA-C  cyclobenzaprine (FLEXERIL) 10 MG tablet Take 1 tablet (10 mg total) by mouth at bedtime. 07/29/15   Anson Fret, MD  DULoxetine (CYMBALTA) 30 MG capsule Take 1 capsule (30 mg total) by mouth daily. 11/05/15   Monica Becton, MD  ibuprofen (ADVIL,MOTRIN) 800 MG tablet TAKE ONE TABLET BY MOUTH EVERY 8 HOURS AS NEEDED 12/19/15   Monica Becton, MD  levonorgestrel (MIRENA) 20 MCG/24HR IUD 1 each by Intrauterine route once.    Historical Provider, MD  LYRICA 75 MG capsule TAKE ONE CAPSULE BY MOUTH IN THE MORNING AND TAKE TWO CAPSULES BY MOUTH AT BEDTIME 01/29/16   Monica Becton, MD  polyethylene glycol [facility] Ethelene Hal) packet Take 17 g by mouth 2 (two) times daily. Until stooling regularly 12/19/15   Monica Becton, MD  predniSONE (DELTASONE) 20 MG tablet 3 tabs po day one, then 2 po  daily x 4 days 02/12/16   Junius Finner, PA-C  tolterodine (DETROL LA) 4 MG 24 hr capsule TAKE ONE CAPSULE BY MOUTH ONCE DAILY 01/29/16   Monica Becton, MD   Meds Ordered and Administered this Visit  Medications - No data to display  BP 125/83 mmHg  Pulse 100  Temp(Src) 98.7 F (37.1 C) (Oral)  Resp 16  Ht 5\' 5"  (1.651 m)  Wt 142 lb (64.411 kg)  BMI 23.63 kg/m2  SpO2 98% No data found.   Physical Exam  Constitutional: She appears well-developed and well-nourished. No distress.  HENT:  Head: Normocephalic and atraumatic.  Right Ear: A middle ear effusion is present.  Left Ear: Tympanic  membrane is erythematous and bulging. A middle ear effusion is present.  Nose: Mucosal edema and rhinorrhea present. Right sinus exhibits maxillary sinus tenderness. Right sinus exhibits no frontal sinus tenderness. Left sinus exhibits no maxillary sinus tenderness and no frontal sinus tenderness.  Mouth/Throat: Uvula is midline and mucous membranes are normal.  Eyes: Conjunctivae are normal. No scleral icterus.  Neck: Normal range of motion.  Cardiovascular: Normal rate, regular rhythm and normal heart sounds.   Pulmonary/Chest: Effort normal and breath sounds normal. No respiratory distress. She has no wheezes. She has no rales.  Abdominal: Soft. She exhibits no distension. There is no tenderness.  Musculoskeletal: Normal range of motion.  Neurological: She is alert.  Skin: Skin is warm and dry. She is not diaphoretic.  Nursing note and vitals reviewed.   ED Course  Procedures (including critical care time)  Labs Review Labs Reviewed - No data to display  Imaging Review No results found.   MDM   1. Recurrent acute serous otitis media of left ear   2. Middle ear effusion, right   3. Acute upper respiratory infection    Pt c/o bilateral ear pain and pressure. Hx of AOM.  Left ear exam c/w AOM, right ear significant for middle ear effusion.  Rx: Augmentin and prednisone.  Advised pt to use acetaminophen and ibuprofen as needed for fever and pain. May use OTC plain Muxinex and sinus rinses. Encouraged rest and fluids. F/u with PCP in 7-10 days if not improving, sooner if worsening. Pt verbalized understanding and agreement with tx plan.     Junius Finner, PA-C 02/12/16 [phone]

## 2016-02-12 NOTE — Discharge Instructions (Signed)
You may take 400-600mg Ibuprofen (Motrin) every 6-8 hours for fever and pain  °Alternate with Tylenol  °You may take 500mg Tylenol every 4-6 hours as needed for fever and pain  °Follow-up with your primary care provider next week for recheck of symptoms if not improving.  °Be sure to drink plenty of fluids and rest, at least 8hrs of sleep a night, preferably more while you are sick. °Return urgent care or go to closest ER if you cannot keep down fluids/signs of dehydration, fever not reducing with Tylenol, difficulty breathing/wheezing, stiff neck, worsening condition, or other concerns (see below)  °Please take antibiotics as prescribed and be sure to complete entire course even if you start to feel better to ensure infection does not come back. ° ° °Cool Mist Vaporizers °Vaporizers may help relieve the symptoms of a cough and cold. They add moisture to the air, which helps mucus to become thinner and less sticky. This makes it easier to breathe and cough up secretions. Cool mist vaporizers do not cause serious burns like hot mist vaporizers, which may also be called steamers or humidifiers. Vaporizers have not been proven to help with colds. You should not use a vaporizer if you are allergic to mold. °HOME CARE INSTRUCTIONS °· Follow the package instructions for the vaporizer. °· Do not use anything other than distilled water in the vaporizer. °· Do not run the vaporizer all of the time. This can cause mold or bacteria to grow in the vaporizer. °· Clean the vaporizer after each time it is used. °· Clean and dry the vaporizer well before storing it. °· Stop using the vaporizer if worsening respiratory symptoms develop. °  °This information is not intended to replace advice given to you by your health care provider. Make sure you discuss any questions you have with your health care provider. °  °Document Released: 08/27/2004 Document Revised: 12/05/2013 Document Reviewed: 04/19/2013 °Elsevier Interactive Patient  Education ©2016 [employer] ° °

## 2016-02-16 ENCOUNTER — Telehealth: Payer: Self-pay | Admitting: Emergency Medicine

## 2016-02-25 ENCOUNTER — Other Ambulatory Visit: Payer: Self-pay | Admitting: Sports Medicine

## 2016-02-25 ENCOUNTER — Emergency Department
Admission: EM | Admit: 2016-02-25 | Discharge: 2016-02-25 | Disposition: A | Discharge status: Home / Self Care | Payer: Federal, State, Local not specified - PPO | Source: Home / Self Care | Attending: Family Medicine | Admitting: Family Medicine

## 2016-02-25 ENCOUNTER — Encounter: Payer: Self-pay | Admitting: *Deleted

## 2016-02-25 DIAGNOSIS — J069 Acute upper respiratory infection, unspecified: Secondary | ICD-10-CM

## 2016-02-25 DIAGNOSIS — R300 Dysuria: Secondary | ICD-10-CM

## 2016-02-25 DIAGNOSIS — B9789 Other viral agents as the cause of diseases classified elsewhere: Principal | ICD-10-CM

## 2016-02-25 LAB — POCT URINALYSIS DIP (MANUAL ENTRY)
BILIRUBIN UA: NEGATIVE
BILIRUBIN UA: NEGATIVE
Glucose, UA: NEGATIVE
Leukocytes, UA: NEGATIVE
Nitrite, UA: NEGATIVE
Protein Ur, POC: NEGATIVE
RBC UA: NEGATIVE
Spec Grav, UA: 1.01 (ref 1.005–1.03)
Urobilinogen, UA: 0.2 (ref 0–1)
pH, UA: 7 (ref 5–8)

## 2016-02-25 MED ORDER — GUAIFENESIN-CODEINE 100-10 MG/5ML PO SOLN: ORAL | Status: DC

## 2016-02-25 NOTE — ED Provider Notes (Signed)
CSN: [id number]     Arrival date & time 02/25/16  0849 History   First MD Initiated Contact with Patient 02/25/16 [phone]     Chief Complaint  Patient presents with  . Cough  . Headache  . Dysuria      HPI Comments: Patient was recently treated for ear infection with resolution of ear discomfort.  During the past week she has developed a sore throat that has improved, but she has had persistent sinus congestion with occasional blood tinged drainage.  She has had a mild cough during the past week.  She has felt increasingly fatigued and noticed soreness in her right neck today.  During the time that she was taking Augmentin she developed mild urinary frequency and urgency, without dysuria, now improved.  The history is provided by the patient.    Past Medical History  Diagnosis Date  . Thyroid goiter     benign  . Right tennis elbow    Past Surgical History  Procedure Laterality Date  . Tonsillectomy  1986   Family History  Problem Relation Age of Onset  . Birth defects Maternal Grandmother   . Cancer Maternal Grandmother     Ovarian  . Birth defects Maternal Grandfather   . Cancer Maternal Grandfather   . Breast cancer Maternal Aunt 42  . Breast cancer Maternal Aunt 50  . Hyperlipidemia Father   . Hypertension Sister   . Breast cancer Sister    Social History  Substance Use Topics  . Smoking status: Never Smoker   . Smokeless tobacco: Never Used  . Alcohol Use: 0.0 oz/week    0 Standard drinks or equivalent per week     Comment: 1-2 weekly   OB History    Gravida Para Term Preterm AB TAB SAB Ectopic Multiple Living   Review of Systems + sore throat + cough No pleuritic pain No wheezing + nasal congestion + post-nasal drainage No sinus pain/pressure No itchy/red eyes No earache No hemoptysis No SOB No fever, ? chills No nausea No vomiting No abdominal pain No diarrhea No urinary symptoms No skin rash + fatigue + myalgias +  headache Used OTC meds without relief  Allergies  Latex  Home Medications   Prior to Admission medications   Medication Sig Start Date End Date Taking? Authorizing Provider  cyclobenzaprine (FLEXERIL) 10 MG tablet Take 1 tablet (10 mg total) by mouth at bedtime. 07/29/15   Anson Fret, MD  DULoxetine (CYMBALTA) 30 MG capsule Take 1 capsule (30 mg total) by mouth daily. 11/05/15   Monica Becton, MD  guaiFENesin-codeine 100-10 MG/5ML syrup Take 10mL by mouth at bedtime as needed for cough 02/25/16   Lattie Haw, MD  levonorgestrel (MIRENA) 20 MCG/24HR IUD 1 each by Intrauterine route once.    Historical Provider, MD  LYRICA 75 MG capsule TAKE ONE CAPSULE BY MOUTH IN THE MORNING AND TAKE TWO CAPSULES BY MOUTH AT BEDTIME 01/29/16   Monica Becton, MD  polyethylene glycol [facility] / Ethelene Hal) packet Take 17 g by mouth 2 (two) times daily. Until stooling regularly 12/19/15   Monica Becton, MD  tolterodine (DETROL LA) 4 MG 24 hr capsule TAKE ONE CAPSULE BY MOUTH ONCE DAILY 01/29/16   Monica Becton, MD   Meds Ordered and Administered this Visit  Medications - No data to display  BP 132/82 mmHg  Pulse 90  Temp(Src) 98.4 F (  36.9 C) (Oral)  Resp 14  Wt 145 lb (65.772 kg)  SpO2 99% No data found.   Physical Exam Nursing notes and Vital Signs reviewed. Appearance:  Patient appears stated age, and in no acute distress Eyes:  Pupils are equal, round, and reactive to light and accomodation.  Extraocular movement is intact.  Conjunctivae are not inflamed  Ears:  Canals normal.  Tympanic membranes normal.  Nose:  Mildly congested turbinates.  No sinus tenderness.  Pharynx:  Normal Neck:  Supple.  Tender enlarged posterior nodes are palpated bilaterally  Lungs:  Clear to auscultation.  Breath sounds are equal.  Moving air well. Heart:  Regular rate and rhythm without murmurs, rubs, or gallops.  Abdomen:  Nontender without masses or hepatosplenomegaly.  Bowel  sounds are present.  No CVA or flank tenderness.  Extremities:  No edema.  Skin:  No rash present.   ED Course  Procedures none     Labs Reviewed  POCT URINALYSIS DIP (MANUAL ENTRY) negative     MDM   1. Viral URI with cough   2. Dysuria    There is no evidence of bacterial infection today.  Treat symptomatically for now  Urine culture pending Rx for Robitussin AC for night time cough.  Take plain guaifenesin (1200mg  extended release tabs such as Mucinex) twice daily, with plenty of water, for cough and congestion.  May continue Pseudoephedrine (30mg , one or two every 4 to 6 hours) for sinus congestion.  Get adequate rest.   May use Afrin nasal spray (or generic oxymetazoline) twice daily for about 5 days and then discontinue.  Also recommend using saline nasal spray several times daily and saline nasal irrigation (AYR is a common brand).  Use Flonase nasal spray each morning after using Afrin nasal spray and saline nasal irrigation. Try warm salt water gargles for sore throat.  Stop all antihistamines for now, and other non-prescription cough/cold preparations. May take Ibuprofen 200mg , 4 tabs every 8 hours with food for body aches, headache, etc.   Follow-up with family doctor if not improving about 7 to10 days.    Lattie Haw, MD 02/25/16 [phone]

## 2016-02-25 NOTE — ED Notes (Signed)
Pt was seen 2 weeks ago for URi. Finshed prednisone and Augmentin. She feels somewhat improved but cough is now productive and having bloody sputum from nose. During her time on the antibiotic she developed urinary frequency and dysuria which is still present but improved. Afebrile. Taken Excedrin Migraine and Sudafed otc.

## 2016-02-25 NOTE — Discharge Instructions (Signed)
Take plain guaifenesin (1200mg  extended release tabs such as Mucinex) twice daily, with plenty of water, for cough and congestion.  May continue Pseudoephedrine (30mg , one or two every 4 to 6 hours) for sinus congestion.  Get adequate rest.   May use Afrin nasal spray (or generic oxymetazoline) twice daily for about 5 days and then discontinue.  Also recommend using saline nasal spray several times daily and saline nasal irrigation (AYR is a common brand).  Use Flonase nasal spray each morning after using Afrin nasal spray and saline nasal irrigation. Try warm salt water gargles for sore throat.  Stop all antihistamines for now, and other non-prescription cough/cold preparations. May take Ibuprofen 200mg , 4 tabs every 8 hours with food for body aches, headache, etc.   Follow-up with family doctor if not improving about 7 to10 days.

## 2016-02-27 ENCOUNTER — Telehealth: Payer: Self-pay | Admitting: *Deleted

## 2016-02-27 LAB — URINE CULTURE: Colony Count: 15000

## 2016-02-28 ENCOUNTER — Other Ambulatory Visit: Payer: Self-pay | Admitting: Sports Medicine

## 2016-03-30 ENCOUNTER — Other Ambulatory Visit: Payer: Self-pay | Admitting: Sports Medicine

## 2016-04-28 ENCOUNTER — Other Ambulatory Visit: Payer: Self-pay | Admitting: Sports Medicine

## 2016-05-05 ENCOUNTER — Ambulatory Visit (INDEPENDENT_AMBULATORY_CARE_PROVIDER_SITE_OTHER): Payer: Federal, State, Local not specified - PPO | Admitting: Sports Medicine

## 2016-05-05 VITALS — BP 116/78 | HR 86 | Temp 98.0°F | Wt 143.0 lb

## 2016-05-05 DIAGNOSIS — J029 Acute pharyngitis, unspecified: Secondary | ICD-10-CM

## 2016-05-05 LAB — POCT RAPID STREP A (OFFICE): Rapid Strep A Screen: NEGATIVE

## 2016-05-05 NOTE — Progress Notes (Signed)
  Subjective:    CC: Sore throat  HPI: This is a pleasant 35 year old female, she recently had a contact with her daughter who was diagnosed with strep throat. She has cough, mild throat dryness, no GI symptoms, no fevers. She is here simply to be checked out.  Past medical history, Surgical history, Family history not pertinant except as noted below, Social history, Allergies, and medications have been entered into the medical record, reviewed, and no changes needed.   Review of Systems: No fevers, chills, night sweats, weight loss, chest pain, or shortness of breath.   Objective:    General: Well Developed, well nourished, and in no acute distress.  Neuro: Alert and oriented x3, extra-ocular muscles intact, sensation grossly intact.  HEENT: Normocephalic, atraumatic, pupils equal round reactive to light, neck supple, no masses, no lymphadenopathy, thyroid nonpalpable. Oropharynx, nasopharynx, ear canals are unremarkable. Skin: Warm and dry, no rashes. Cardiac: Regular rate and rhythm, no murmurs rubs or gallops, no lower extremity edema.  Respiratory: Clear to auscultation bilaterally. Not using accessory muscles, speaking in full sentences.  Rapid strep is negative.  Impression and Recommendations:

## 2016-05-05 NOTE — Progress Notes (Signed)
Patient came into clinic today for rapid strep test. Pt reports her throat has been dry/itchy for about a week. Her daughter tested positive for strep a "couple days ago" so Pt wants to make sure that's not what is causing her discomfort. Rapid test preformed. Results given to PCP.

## 2016-05-05 NOTE — Assessment & Plan Note (Signed)
Salt water gargles, NSAIDs, no antibiotics needed, this should be better in 2 weeks.

## 2016-05-25 ENCOUNTER — Ambulatory Visit (INDEPENDENT_AMBULATORY_CARE_PROVIDER_SITE_OTHER): Payer: Federal, State, Local not specified - PPO

## 2016-05-25 ENCOUNTER — Ambulatory Visit (INDEPENDENT_AMBULATORY_CARE_PROVIDER_SITE_OTHER): Payer: Federal, State, Local not specified - PPO | Admitting: Sports Medicine

## 2016-05-25 ENCOUNTER — Encounter: Payer: Self-pay | Admitting: Sports Medicine

## 2016-05-25 VITALS — BP 121/81 | HR 96 | Resp 16 | Ht 65.0 in | Wt 146.0 lb

## 2016-05-25 DIAGNOSIS — M7121 Synovial cyst of popliteal space [Baker], right knee: Secondary | ICD-10-CM

## 2016-05-25 DIAGNOSIS — M51369 Other intervertebral disc degeneration, lumbar region without mention of lumbar back pain or lower extremity pain: Secondary | ICD-10-CM

## 2016-05-25 DIAGNOSIS — Z0000 Encounter for general adult medical examination without abnormal findings: Secondary | ICD-10-CM

## 2016-05-25 DIAGNOSIS — M25552 Pain in left hip: Secondary | ICD-10-CM

## 2016-05-25 DIAGNOSIS — M47816 Spondylosis without myelopathy or radiculopathy, lumbar region: Secondary | ICD-10-CM | POA: Insufficient documentation

## 2016-05-25 DIAGNOSIS — M5136 Other intervertebral disc degeneration, lumbar region: Secondary | ICD-10-CM

## 2016-05-25 DIAGNOSIS — M898X8 Other specified disorders of bone, other site: Secondary | ICD-10-CM

## 2016-05-25 DIAGNOSIS — Z01419 Encounter for gynecological examination (general) (routine) without abnormal findings: Secondary | ICD-10-CM

## 2016-05-25 HISTORY — DX: Other intervertebral disc degeneration, lumbar region without mention of lumbar back pain or lower extremity pain: M51.369

## 2016-05-25 HISTORY — DX: Other intervertebral disc degeneration, lumbar region: M51.36

## 2016-05-25 NOTE — Assessment & Plan Note (Signed)
15 month response to the previous procedure, repeat aspiration and injection today.

## 2016-05-25 NOTE — Progress Notes (Addendum)
  Subjective:    CC: CPE  HPI:  This is a pleasant 35 year old female, she see her for a physical, no complete the exception of her right knee, she is a history of a Baker's cyst that I injected and drained approximately 15 months ago. Now having a recurrence of minimal pain, and desires repeat interventional treatment today.  Left hip pain: Nonspecific, present for a short period of time, localized only over the left iliac crest without radiation.  Past medical history, Surgical history, Family history not pertinant except as noted below, Social history, Allergies, and medications have been entered into the medical record, reviewed, and no changes needed.   Review of Systems: No headache, visual changes, nausea, vomiting, diarrhea, constipation, dizziness, abdominal pain, skin rash, fevers, chills, night sweats, swollen lymph nodes, weight loss, chest pain, body aches, joint swelling, muscle aches, shortness of breath, mood changes, visual or auditory hallucinations.  Objective:    General: Well Developed, well nourished, and in no acute distress.  Neuro: Alert and oriented x3, extra-ocular muscles intact, sensation grossly intact. Cranial nerves II through XII are intact, motor, sensory, and coordinative functions are all intact. HEENT: Normocephalic, atraumatic, pupils equal round reactive to light, neck supple, no masses, no lymphadenopathy, thyroid nonpalpable. Oropharynx, nasopharynx, external ear canals are unremarkable. Skin: Warm and dry, no rashes noted.  Cardiac: Regular rate and rhythm, no murmurs rubs or gallops.  Respiratory: Clear to auscultation bilaterally. Not using accessory muscles, speaking in full sentences.  Abdominal: Soft, nontender, nondistended, positive bowel sounds, no masses, no organomegaly.  Left Hip: ROM IR: 60 Deg, ER: 60 Deg, Flexion: 120 Deg, Extension: 100 Deg, Abduction: 45 Deg, Adduction: 45 Deg Strength IR: 5/5, ER: 5/5, Flexion: 5/5, Extension: 5/5,  Abduction: 5/5, Adduction: 5/5 Pelvic alignment unremarkable to inspection and palpation. Standing hip rotation and gait without trendelenburg / unsteadiness. Greater trochanter without tenderness to palpation. Tenderness to palpation over the iliac crest. Unable to reproduce this pain with resisted hip flexion or abduction No tenderness over piriformis. No SI joint tenderness and normal minimal SI movement.  Procedure: Real-time Ultrasound Guided aspiration/injection of right knee Baker's cyst Device: GE Logiq E  Verbal informed consent obtained.  Time-out conducted.  Noted no overlying erythema, induration, or other signs of local infection.  Skin prepped in a sterile fashion.  Local anesthesia: Topical Ethyl chloride.  With sterile technique and under real time ultrasound guidance:   18-gauge needle advanced into a visible Baker's cyst, aspirated scant fluid, syringe switched and 1 mL kenalog 40, 1 mL lidocaine injected easily. Completed without difficulty  Pain immediately resolved suggesting accurate placement of the medication.  Advised to call if fevers/chills, erythema, induration, drainage, or persistent bleeding.  Images permanently stored and available for review in the ultrasound unit.  Impression: Technically successful ultrasound guided injection.  Impression and Recommendations:    The patient was counselled, risk factors were discussed, anticipatory guidance given.

## 2016-05-25 NOTE — Assessment & Plan Note (Signed)
Painful only to direct palpation, x-rays.

## 2016-05-25 NOTE — Assessment & Plan Note (Signed)
Physical as above, checking routine blood work.

## 2016-06-01 ENCOUNTER — Other Ambulatory Visit: Payer: Self-pay | Admitting: Sports Medicine

## 2016-06-01 ENCOUNTER — Encounter: Payer: Self-pay | Admitting: Sports Medicine

## 2016-06-02 ENCOUNTER — Ambulatory Visit (INDEPENDENT_AMBULATORY_CARE_PROVIDER_SITE_OTHER): Payer: Federal, State, Local not specified - PPO

## 2016-06-02 ENCOUNTER — Encounter: Payer: Self-pay | Admitting: Sports Medicine

## 2016-06-02 ENCOUNTER — Ambulatory Visit (INDEPENDENT_AMBULATORY_CARE_PROVIDER_SITE_OTHER): Payer: Federal, State, Local not specified - PPO | Admitting: Sports Medicine

## 2016-06-02 VITALS — BP 121/80 | HR 103 | Wt 141.0 lb

## 2016-06-02 DIAGNOSIS — W19XXXA Unspecified fall, initial encounter: Secondary | ICD-10-CM | POA: Insufficient documentation

## 2016-06-02 DIAGNOSIS — M545 Low back pain: Secondary | ICD-10-CM | POA: Diagnosis not present

## 2016-06-02 DIAGNOSIS — S99922A Unspecified injury of left foot, initial encounter: Secondary | ICD-10-CM | POA: Diagnosis not present

## 2016-06-02 DIAGNOSIS — M549 Dorsalgia, unspecified: Secondary | ICD-10-CM

## 2016-06-02 DIAGNOSIS — M79672 Pain in left foot: Secondary | ICD-10-CM | POA: Diagnosis not present

## 2016-06-02 DIAGNOSIS — S3992XA Unspecified injury of lower back, initial encounter: Secondary | ICD-10-CM | POA: Diagnosis not present

## 2016-06-02 DIAGNOSIS — Z0000 Encounter for general adult medical examination without abnormal findings: Secondary | ICD-10-CM | POA: Diagnosis not present

## 2016-06-02 DIAGNOSIS — M25572 Pain in left ankle and joints of left foot: Secondary | ICD-10-CM | POA: Diagnosis not present

## 2016-06-02 NOTE — Progress Notes (Signed)
   Date of Injury: 05/29/2016 Employer: [employer] #: Work Status:  Subjective:    CC: Follow-up  HPI: Dawn [name] was helping a veteran walk, he fell on her, she has back pain and pain over her third and fourth metatarsal shafts, pain is mild, improving. This is a Teacher, adult education. injury and she simply needs this to get checked out.  Past medical history, Surgical history, Family history not pertinant except as noted below, Social history, Allergies, and medications have been entered into the medical record, reviewed, and no changes needed.   Review of Systems: No fevers, chills, night sweats, weight loss, chest pain, or shortness of breath.   Objective:    General: Well Developed, well nourished, and in no acute distress.  Neuro: Alert and oriented x3, extra-ocular muscles intact, sensation grossly intact.  HEENT: Normocephalic, atraumatic, pupils equal round reactive to light, neck supple, no masses, no lymphadenopathy, thyroid nonpalpable.  Skin: Warm and dry, no rashes. Cardiac: Regular rate and rhythm, no murmurs rubs or gallops, no lower extremity edema.  Respiratory: Clear to auscultation bilaterally. Not using accessory muscles, speaking in full sentences. Left Ankle: No visible erythema or swelling. Range of motion is full in all directions. Strength is 5/5 in all directions. Stable lateral and medial ligaments; squeeze test and kleiger test unremarkable; Talar dome nontender; No pain at base of 5th MT; No tenderness over cuboid; No tenderness over N spot or navicular prominence No tenderness on posterior aspects of lateral and medial malleolus No sign of peroneal tendon subluxations; Negative tarsal tunnel tinel's Able to walk 4 steps. Left Foot: No visible erythema or swelling. Range of motion is full in all directions. Strength is 5/5 in all directions. No hallux valgus. No pes cavus or pes planus. No abnormal callus noted. No pain over the  navicular prominence, or base of fifth metatarsal. No tenderness to palpation of the calcaneal insertion of plantar fascia. No pain at the Achilles insertion. No pain over the calcaneal bursa. No pain of the retrocalcaneal bursa. No tenderness to palpation over the tarsals, metatarsals, or phalanges. No hallux rigidus or limitus. No tenderness palpation over interphalangeal joints. No pain with compression of the metatarsal heads. Neurovascularly intact distally. Back Exam:  Inspection: Unremarkable  Motion: Flexion 45 deg, Extension 45 deg, Side Bending to 45 deg bilaterally,  Rotation to 45 deg bilaterally  SLR laying: Negative  XSLR laying: Negative  Palpable tenderness: None. FABER: negative. Sensory change: Gross sensation intact to all lumbar and sacral dermatomes.  Reflexes: 2+ at both patellar tendons, 2+ at achilles tendons, Babinski's downgoing.  Strength at foot  Plantar-flexion: 5/5 Dorsi-flexion: 5/5 Eversion: 5/5 Inversion: 5/5  Leg strength  Quad: 5/5 Hamstring: 5/5 Hip flexor: 5/5 Hip abductors: 5/5  Gait unremarkable.  Impression and Recommendations:    I spent 25 minutes with this patient, greater than 50% was face-to-face time counseling regarding the above diagnoses

## 2016-06-02 NOTE — Assessment & Plan Note (Signed)
Injury occurred approximately 6 days ago, someone fell on her and work. Likely has a lumbar strain and a foot sprain. X-rays, ibuprofen 800 which she has already, return as needed for this.

## 2016-06-03 LAB — COMPREHENSIVE METABOLIC PANEL
AST: 18 U/L (ref 10–30)
Albumin: 4.5 g/dL (ref 3.6–5.1)
Alkaline Phosphatase: 43 U/L (ref 33–115)
CO2: 26 mmol/L (ref 20–31)
Calcium: 9.5 mg/dL (ref 8.6–10.2)
Total Bilirubin: 0.8 mg/dL (ref 0.2–1.2)

## 2016-06-03 LAB — CBC
HCT: 44.2 % (ref 35.0–45.0)
Hemoglobin: 14.5 g/dL (ref 11.7–15.5)
MCH: 29.1 pg (ref 27.0–33.0)
MCHC: 32.8 g/dL (ref 32.0–36.0)
MCV: 88.6 fL (ref 80.0–100.0)
MPV: 10.5 fL (ref 7.5–12.5)
Platelets: 212 K/uL (ref 140–400)
RBC: 4.99 MIL/uL (ref 3.80–5.10)
RDW: 13.9 % (ref 11.0–15.0)
WBC: 6.6 10*3/uL (ref 3.8–10.8)

## 2016-06-03 LAB — COMPREHENSIVE METABOLIC PANEL WITH GFR
ALT: 7 U/L (ref 6–29)
BUN: 16 mg/dL (ref 7–25)
Chloride: 100 mmol/L (ref 98–110)
Creat: 0.66 mg/dL (ref 0.50–1.10)
Glucose, Bld: 86 mg/dL (ref 65–99)
Potassium: 3.8 mmol/L (ref 3.5–5.3)
Sodium: 136 mmol/L (ref 135–146)
Total Protein: 6.6 g/dL (ref 6.1–8.1)

## 2016-06-03 LAB — LIPID PANEL
Cholesterol: 262 mg/dL — ABNORMAL HIGH (ref 125–200)
HDL: 78 mg/dL (ref >=46)
LDL Cholesterol: 167 mg/dL — ABNORMAL HIGH (ref <130)
Total CHOL/HDL Ratio: 3.4 Ratio (ref <=5.0)
Triglycerides: 84 mg/dL (ref <150)
VLDL: 17 mg/dL (ref <30)

## 2016-06-03 LAB — TSH: TSH: 1.61 m[IU]/L

## 2016-06-03 LAB — HEMOGLOBIN A1C
Hgb A1c MFr Bld: 5.1 % (ref <5.7)
Mean Plasma Glucose: 100 mg/dL

## 2016-06-10 ENCOUNTER — Encounter: Payer: Self-pay | Admitting: Sports Medicine

## 2016-06-28 ENCOUNTER — Encounter: Payer: Self-pay | Admitting: Sports Medicine

## 2016-06-30 ENCOUNTER — Other Ambulatory Visit: Payer: Self-pay | Admitting: Sports Medicine

## 2016-07-06 ENCOUNTER — Other Ambulatory Visit: Payer: Self-pay | Admitting: Sports Medicine

## 2016-07-06 DIAGNOSIS — Z803 Family history of malignant neoplasm of breast: Secondary | ICD-10-CM

## 2016-07-06 DIAGNOSIS — Z1231 Encounter for screening mammogram for malignant neoplasm of breast: Secondary | ICD-10-CM

## 2016-07-07 ENCOUNTER — Encounter: Payer: Self-pay | Admitting: Physician Assistant

## 2016-07-07 ENCOUNTER — Ambulatory Visit (INDEPENDENT_AMBULATORY_CARE_PROVIDER_SITE_OTHER): Payer: Federal, State, Local not specified - PPO | Admitting: Physician Assistant

## 2016-07-07 VITALS — BP 109/66 | HR 95 | Temp 98.5°F | Ht 65.0 in | Wt 142.0 lb

## 2016-07-07 DIAGNOSIS — N898 Other specified noninflammatory disorders of vagina: Secondary | ICD-10-CM | POA: Diagnosis not present

## 2016-07-07 DIAGNOSIS — R110 Nausea: Secondary | ICD-10-CM | POA: Diagnosis not present

## 2016-07-07 DIAGNOSIS — M25551 Pain in right hip: Secondary | ICD-10-CM

## 2016-07-07 DIAGNOSIS — R319 Hematuria, unspecified: Secondary | ICD-10-CM

## 2016-07-07 DIAGNOSIS — R10812 Left upper quadrant abdominal tenderness: Secondary | ICD-10-CM | POA: Diagnosis not present

## 2016-07-07 DIAGNOSIS — N9089 Other specified noninflammatory disorders of vulva and perineum: Secondary | ICD-10-CM

## 2016-07-07 DIAGNOSIS — M25552 Pain in left hip: Secondary | ICD-10-CM | POA: Diagnosis not present

## 2016-07-07 LAB — POCT URINALYSIS DIPSTICK
Bilirubin, UA: NEGATIVE
Glucose, UA: NEGATIVE
KETONES UA: NEGATIVE
LEUKOCYTES UA: NEGATIVE
Nitrite, UA: NEGATIVE
PH UA: 5.5
PROTEIN UA: NEGATIVE
SPEC GRAV UA: 1.02
UROBILINOGEN UA: 0.2

## 2016-07-07 LAB — POCT URINE PREGNANCY: PREG TEST UR: NEGATIVE

## 2016-07-07 MED ORDER — KETOROLAC TROMETHAMINE 60 MG/2ML IM SOLN
60.0000 mg | Freq: Once | INTRAMUSCULAR | Status: AC
  Administered 2016-07-07: 60 mg via INTRAMUSCULAR

## 2016-07-07 NOTE — Patient Instructions (Addendum)
Follow up to repeat UA to follow up on hematuria.  Will call with labs.

## 2016-07-07 NOTE — Progress Notes (Signed)
Subjective:    Patient ID: Dawn [name], female    DOB: 10/05/1981, 35 y.o.   MRN: [id number]  HPI  Pt is a 35 yo female who presents to the clinic with bilateral hip, left flank pain, nausea, vaginal discharge and labia tenderness. PMH positive for myofascial pain syndrome.  She admits to having bilateral hip pain for a while. Dr. Karie Schwalbe is working with her on this. She feels like it is worse over past few days and there have been no new injuries. She is concerned because she is more achy over her whole left side. Ibuprofen has helped a little. No fever. She has been nauseated for a couple of weeks. She took a home pregnancy test and was negative. She does have mirena but due to come out.  Over past 2 days she has had whiteish to clear discharge with no odor or itching. She has also felt like the left outer labia is irritated and swollen. She has done nothing to make better. She denies any lesions, dysuria or lower abdominal pain.  She denies any recent changes to medications or doses of medications.     Review of Systems    see HPI.  Objective:   Physical Exam  Constitutional: She is oriented to person, place, and time. She appears well-developed and well-nourished.  HENT:  Head: Normocephalic and atraumatic.  Cardiovascular: Normal rate, regular rhythm and normal heart sounds.   Pulmonary/Chest: Effort normal and breath sounds normal.  NO CVA tenderness.   Abdominal: Soft. Bowel sounds are normal. She exhibits no distension and no mass. There is no rebound and no guarding.  No significant tenderness to palpation. But generalized tenderness in all 4 quadrants with a little more emphasis over LUQ. No guarding or rebound noted.   Genitourinary: Vagina normal.     Musculoskeletal:  Good ROM of bilateral hips.  No tenderness over greater trochanter bilaterally.  Mild discomfort over left lower lumbar paraspinous muscles to palpation.  Normal ROM at waist.   Neurological: She is alert  and oriented to person, place, and time.  Psychiatric: She has a normal mood and affect. Her behavior is normal.          Assessment & Plan:  Overall discussed with patient that symptoms do not seem to be all related.   Bilateral hip pain- Dr. Karie Schwalbe is managing. Toradol 60mg  IM today to see if she gets any relief. Discussed biofreeze, heat and stretching.   Nausea- I think could be caused due to pain. UPT negative in office on mirena.   LUQ tenderness on exam- CBC, CMP, lipase ordered. With PE findings and nausea need to rule out pancreatitis. Likely not causes from NSAIDs only took ibuprofen once.   Labia irritation/vaginal discharge- on exam seem normal. Reassured patient. Discussed sitz baths.  Wet prep sent to evaluate discharge.   .. Results for orders placed or performed in visit on 07/07/16  POCT urinalysis dipstick  Result Value Ref Range   Color, UA yellow    Clarity, UA clear    Glucose, UA neg    Bilirubin, UA neg    Ketones, UA neg    Spec Grav, UA 1.020    Blood, UA moderate    pH, UA 5.5    Protein, UA neg    Urobilinogen, UA 0.2    Nitrite, UA neg    Leukocytes, UA Negative Negative  POCT urine pregnancy  Result Value Ref Range   Preg Test, Ur Negative Negative  Hematuria- found with UA. Repeat in next 2 weeks. Pt is not on her cycle. Will culture urine.

## 2016-07-08 ENCOUNTER — Encounter: Payer: Self-pay | Admitting: Physician Assistant

## 2016-07-08 LAB — WET PREP FOR TRICH, YEAST, CLUE
CLUE CELLS WET PREP: NONE SEEN
TRICH WET PREP: NONE SEEN
WBC WET PREP: NONE SEEN
YEAST WET PREP: NONE SEEN

## 2016-07-08 LAB — CBC WITH DIFFERENTIAL/PLATELET
BASOS PCT: 1 %
Basophils Absolute: 56 cells/uL (ref 0–200)
EOS ABS: 168 {cells}/uL (ref 15–500)
Eosinophils Relative: 3 %
HCT: 42.8 % (ref 35.0–45.0)
Hemoglobin: 14 g/dL (ref 11.7–15.5)
LYMPHS PCT: 34 %
Lymphs Abs: 1904 cells/uL (ref 850–3900)
MCH: 29.5 pg (ref 27.0–33.0)
MCHC: 32.7 g/dL (ref 32.0–36.0)
MCV: 90.3 fL (ref 80.0–100.0)
MONOS PCT: 9 %
MPV: 10.2 fL (ref 7.5–12.5)
Monocytes Absolute: 504 cells/uL (ref 200–950)
Neutro Abs: 2968 cells/uL (ref 1500–7800)
Neutrophils Relative %: 53 %
PLATELETS: 189 10*3/uL (ref 140–400)
RBC: 4.74 MIL/uL (ref 3.80–5.10)
RDW: 13.7 % (ref 11.0–15.0)
WBC: 5.6 10*3/uL (ref 3.8–10.8)

## 2016-07-08 LAB — COMPLETE METABOLIC PANEL WITH GFR
ALT: 8 U/L (ref 6–29)
AST: 18 U/L (ref 10–30)
Albumin: 4.2 g/dL (ref 3.6–5.1)
Alkaline Phosphatase: 43 U/L (ref 33–115)
BUN: 13 mg/dL (ref 7–25)
CHLORIDE: 103 mmol/L (ref 98–110)
CO2: 27 mmol/L (ref 20–31)
Calcium: 8.9 mg/dL (ref 8.6–10.2)
Creat: 0.63 mg/dL (ref 0.50–1.10)
Glucose, Bld: 81 mg/dL (ref 65–99)
Potassium: 4 mmol/L (ref 3.5–5.3)
Sodium: 136 mmol/L (ref 135–146)
Total Bilirubin: 0.8 mg/dL (ref 0.2–1.2)
Total Protein: 6.5 g/dL (ref 6.1–8.1)

## 2016-07-08 LAB — LIPASE: Lipase: 17 U/L (ref 7–60)

## 2016-07-09 ENCOUNTER — Encounter: Payer: Self-pay | Admitting: Physician Assistant

## 2016-07-09 ENCOUNTER — Ambulatory Visit
Admission: RE | Admit: 2016-07-09 | Discharge: 2016-07-09 | Disposition: A | Discharge status: Home / Self Care | Payer: Federal, State, Local not specified - PPO | Source: Ambulatory Visit | Attending: Sports Medicine | Admitting: Sports Medicine

## 2016-07-09 DIAGNOSIS — Z803 Family history of malignant neoplasm of breast: Secondary | ICD-10-CM

## 2016-07-09 DIAGNOSIS — Z1231 Encounter for screening mammogram for malignant neoplasm of breast: Secondary | ICD-10-CM | POA: Diagnosis not present

## 2016-07-13 ENCOUNTER — Encounter: Payer: Self-pay | Admitting: Sports Medicine

## 2016-07-14 ENCOUNTER — Encounter: Payer: Self-pay | Admitting: Sports Medicine

## 2016-07-14 ENCOUNTER — Ambulatory Visit (INDEPENDENT_AMBULATORY_CARE_PROVIDER_SITE_OTHER): Payer: Federal, State, Local not specified - PPO | Admitting: Sports Medicine

## 2016-07-14 VITALS — BP 101/66 | HR 96 | Resp 18 | Wt 145.0 lb

## 2016-07-14 DIAGNOSIS — R319 Hematuria, unspecified: Secondary | ICD-10-CM

## 2016-07-14 DIAGNOSIS — R3129 Other microscopic hematuria: Secondary | ICD-10-CM | POA: Diagnosis not present

## 2016-07-14 DIAGNOSIS — M7918 Myalgia, other site: Secondary | ICD-10-CM

## 2016-07-14 DIAGNOSIS — M791 Myalgia: Secondary | ICD-10-CM | POA: Diagnosis not present

## 2016-07-14 LAB — POCT URINALYSIS DIPSTICK
Bilirubin, UA: NEGATIVE
Glucose, UA: NEGATIVE
Ketones, UA: NEGATIVE
Leukocytes, UA: NEGATIVE
Nitrite, UA: NEGATIVE
Protein, UA: NEGATIVE
Spec Grav, UA: 1.01
Urobilinogen, UA: 0.2
pH, UA: 7

## 2016-07-14 LAB — POCT URINE PREGNANCY: Preg Test, Ur: NEGATIVE

## 2016-07-14 MED ORDER — MELOXICAM 15 MG PO TABS: 15.0000 mg | ORAL_TABLET | Freq: Every day | ORAL | 0 refills | Status: DC

## 2016-07-14 MED ORDER — CEPHALEXIN 500 MG PO CAPS: 500.0000 mg | ORAL_CAPSULE | Freq: Two times a day (BID) | ORAL | 0 refills | Status: DC

## 2016-07-14 NOTE — Assessment & Plan Note (Signed)
Adding a urine culture this time, treating with Keflex.  Return in 2 weeks to recheck urinalysis. If persistent hematuria we do need to image her urinary tract.

## 2016-07-14 NOTE — Progress Notes (Signed)
  Subjective:    CC: low back pain    HPI: 35 yo F with history of myofascial pain syndrome presenting with a week and a half of low back pain, left greater than right.  She says pain is a "deep, throbbing" pain that is constant.  Pain is worse when extending her back and standing.  Pain does not radiate - no radicular symptoms.  She has tried Ibuprofen for the pain, without relief.  She was also worked up last week for microscopic hematuria.  No episodes of bloody urine in the past week.  No evidence of kidney stones on XRay last week.     Past medical history, Surgical history, Family history not pertinant except as noted below, Social history, Allergies, and medications have been entered into the medical record, reviewed, and no changes needed.   Review of Systems: No fevers, chills, night sweats, weight loss, chest pain.  Positive SOB, cough.   Objective:    General: Well Developed, well nourished, and in no acute distress.  Neuro: Alert and oriented x3, extra-ocular muscles intact, sensation grossly intact.  HEENT: Normocephalic, atraumatic, pupils equal round reactive to light, neck supple, no masses, no lymphadenopathy, thyroid nonpalpable.  Skin: Warm and dry, no rashes. Cardiac: Regular rate and rhythm, no murmurs rubs or gallops, no lower extremity edema.  Respiratory: Clear to auscultation bilaterally. Not using accessory muscles, speaking in full sentences. Back Exam:  Inspection: Unremarkable  Motion: Flexion 45 deg, Extension 45 deg, Side Bending to 45 deg bilaterally,  Rotation to 45 deg bilaterally  SLR laying: Negative  XSLR laying: Negative  Palpable tenderness: None. FABER: negative. Sensory change: Gross sensation intact to all lumbar and sacral dermatomes.  Reflexes: 2+ at both patellar tendons, 2+ at achilles tendons, Babinski's downgoing.  Strength at foot  Plantar-flexion: 5/5 Dorsi-flexion: 5/5 Eversion: 5/5 Inversion: 5/5  Leg strength  Quad: 5/5 Hamstring:  5/5 Hip flexor: 5/5 Hip abductors: 5/5  Gait unremarkable.   Impression and Recommendations:     1. Left back pain - likely myofascial pain syndrome -Prescribed Meloxicam.  Continue Lyrica and Cymbalta as prescribed.  -PT exercise worksheet provided  -Follow up in 2 weeks - if pain unresolved at that time, will get MRI of L spine.   2. Microscopic hematuria - will treat for UTI -Will culture urine  -Prescribed Keflex -Will repeat UA in 2 weeks

## 2016-07-14 NOTE — Assessment & Plan Note (Signed)
With diffuse pain in the left lower lumbar spine, not reproducible and worse with standing. We will start conservatively with meloxicam, rehabilitation exercises, continue current dose of Lyrica and Cymbalta. If insufficient improvement over 2 weeks we will proceed with MRI.

## 2016-07-17 LAB — URINE CULTURE: Organism ID, Bacteria: NO GROWTH

## 2016-07-28 ENCOUNTER — Ambulatory Visit: Payer: Federal, State, Local not specified - PPO | Admitting: Sports Medicine

## 2016-07-29 ENCOUNTER — Ambulatory Visit: Payer: Federal, State, Local not specified - PPO | Admitting: Neurology

## 2016-07-31 ENCOUNTER — Other Ambulatory Visit: Payer: Self-pay | Admitting: Sports Medicine

## 2016-08-04 ENCOUNTER — Encounter: Payer: Self-pay | Admitting: Sports Medicine

## 2016-08-04 ENCOUNTER — Ambulatory Visit (INDEPENDENT_AMBULATORY_CARE_PROVIDER_SITE_OTHER): Payer: Federal, State, Local not specified - PPO | Admitting: Sports Medicine

## 2016-08-04 VITALS — BP 118/82 | HR 98 | Temp 98.2°F | Resp 18 | Wt 146.4 lb

## 2016-08-04 DIAGNOSIS — R319 Hematuria, unspecified: Secondary | ICD-10-CM

## 2016-08-04 DIAGNOSIS — R3129 Other microscopic hematuria: Secondary | ICD-10-CM

## 2016-08-04 DIAGNOSIS — M898X8 Other specified disorders of bone, other site: Secondary | ICD-10-CM | POA: Diagnosis not present

## 2016-08-04 DIAGNOSIS — R1013 Epigastric pain: Secondary | ICD-10-CM | POA: Diagnosis not present

## 2016-08-04 LAB — POCT URINALYSIS DIPSTICK
Bilirubin, UA: NEGATIVE
Glucose, UA: NEGATIVE
Ketones, UA: NEGATIVE
Leukocytes, UA: NEGATIVE
Nitrite, UA: NEGATIVE
Protein, UA: NEGATIVE
Spec Grav, UA: 1.015
Urobilinogen, UA: 0.2
pH, UA: 8

## 2016-08-04 MED ORDER — SUCRALFATE 1 G PO TABS: 1.0000 g | ORAL_TABLET | Freq: Four times a day (QID) | ORAL | 0 refills | Status: DC

## 2016-08-04 MED ORDER — ACETAMINOPHEN ER 650 MG PO TBCR
650.0000 mg | EXTENDED_RELEASE_TABLET | Freq: Three times a day (TID) | ORAL | 3 refills | Status: DC | PRN
Start: 2016-08-04 — End: 2017-03-15

## 2016-08-04 MED ORDER — PANTOPRAZOLE SODIUM 40 MG PO TBEC: 40.0000 mg | DELAYED_RELEASE_TABLET | Freq: Every day | ORAL | 3 refills | Status: DC

## 2016-08-04 NOTE — Addendum Note (Signed)
Addended by: Baird KayUGLAS, Mandrell Vangilder M on: 08/04/2016 04:22 PM   Modules accepted: Orders

## 2016-08-04 NOTE — Assessment & Plan Note (Signed)
I do suspect there is a relation to meloxicam use. Symptoms do sound like dyspepsia. H. pylori test, Protonix, Carafate. Switching to Tylenol. Return in one month.

## 2016-08-04 NOTE — Assessment & Plan Note (Addendum)
X-rays have been negative, at this point we are going to proceed with MRi of the pelvis without contrast

## 2016-08-04 NOTE — Assessment & Plan Note (Signed)
Persistent but with a negative CT in the past. Adding an ultrasound.

## 2016-08-04 NOTE — Progress Notes (Addendum)
  Subjective:    CC: stomach pain   HPI: 35 yo F presenting with intermittent stomach pain.  She says this pain has been ongoing for the past month and describes it as a "gnawing, cramping" pain in her epigastric region that radiates into her mid-back.  She also has nausea with the pain.  The pain comes and goes but is unchanged after a meal.  She does not take an acid blocker.  She denies hematochezia or melena, but does say she has pink blood on her toilet paper after bowel movements.  She has a bowel movement 1-2 times per day.  She says BMs are sometimes hard.  She denies dysuria or frank blood in her urine. She also states her hips have been hurting her for the past two weeks, worse than normally.      Past medical history, Surgical history, Family history not pertinant except as noted below, Social history, Allergies, and medications have been entered into the medical record, reviewed, and no changes needed.   Review of Systems: No fevers, chills, night sweats, weight loss, chest pain, or shortness of breath.   Objective:    General: Well Developed, well nourished, and in no acute distress.  Neuro: Alert and oriented x3, extra-ocular muscles intact, sensation grossly intact.  HEENT: Normocephalic, atraumatic, pupils equal round reactive to light, neck supple, no masses, no lymphadenopathy, thyroid nonpalpable.  Skin: Warm and dry, no rashes. Cardiac: Regular rate and rhythm, no murmurs rubs or gallops, no lower extremity edema.  Respiratory: Clear to auscultation bilaterally. Not using accessory muscles, speaking in full sentences. R, L Hip: ROM IR: 45 Deg, ER: 45 Deg, Flexion: 120 Deg, Extension: 100 Deg, Abduction: 45 Deg, Adduction: 45 Deg Strength IR: 5/5, ER: 5/5, Flexion: 5/5, Extension: 5/5, Abduction: 5/5, Adduction: 5/5 Pelvic alignment unremarkable to inspection and palpation. Pain over anterior iliac crest bilaterally.  Standing hip rotation and gait without trendelenburg  sign / unsteadiness. Greater trochanter without tenderness to palpation. No tenderness over piriformis. No pain with FABER or FADIR. No SI joint tenderness and normal minimal SI movement.   Impression and Recommendations:   1. Epigastric pain: likely PUD  -H. Pylori test in office  -Discontinue Meloxicam -Start Protonix, PPI  -Return in 1 month, if pain has not resolved   2. Hip pain -MRI of pelvis  -Discontinue Meloxicam in setting of potential PUD.   -Begin Tylenol 650mg  tid   3. Microscopic hematuria:  -renal US   I spent 40 minutes with this patient, greater than 50% was face-to-face time counseling regarding the above diagnoses

## 2016-08-05 ENCOUNTER — Encounter: Payer: Self-pay | Admitting: Obstetrics & Gynecology

## 2016-08-05 ENCOUNTER — Ambulatory Visit (INDEPENDENT_AMBULATORY_CARE_PROVIDER_SITE_OTHER): Payer: Federal, State, Local not specified - PPO | Admitting: Obstetrics & Gynecology

## 2016-08-05 VITALS — BP 117/70 | HR 93 | Resp 16 | Ht 65.0 in | Wt 146.0 lb

## 2016-08-05 DIAGNOSIS — Z01419 Encounter for gynecological examination (general) (routine) without abnormal findings: Secondary | ICD-10-CM

## 2016-08-05 DIAGNOSIS — Z1151 Encounter for screening for human papillomavirus (HPV): Secondary | ICD-10-CM

## 2016-08-05 DIAGNOSIS — K921 Melena: Secondary | ICD-10-CM | POA: Diagnosis not present

## 2016-08-05 DIAGNOSIS — Z30432 Encounter for removal of intrauterine contraceptive device: Secondary | ICD-10-CM

## 2016-08-05 DIAGNOSIS — Z124 Encounter for screening for malignant neoplasm of cervix: Secondary | ICD-10-CM

## 2016-08-05 LAB — H. PYLORI BREATH TEST: H. pylori Breath Test: NOT DETECTED

## 2016-08-05 NOTE — Progress Notes (Signed)
Subjective:    Dawn [name] is a 35 y.o. MW P2 (57 yo daughter)  female who presents for an annual exam. The patient has no complaints today. She wants her Mirena removed as it is expiring and she may want another pregnancy. She is taking MVIs daily. She has been seeing some BRB with BMs. The patient is sexually active. GYN screening history: last pap: was normal. The patient wears seatbelts: yes. The patient participates in regular exercise: no. Has the patient ever been transfused or tattooed?: no. The patient reports that there is not domestic violence in her life.   Menstrual History: OB History    Gravida Para Term Preterm AB Living   _0 SAB TAB Ectopic Multiple Live Births           1      Menarche age: 69 No LMP recorded.    The following portions of the patient's history were reviewed and updated as appropriate: allergies, current medications, past family history, past medical history, past social history, past surgical history and problem list.  Review of Systems Pertinent items are noted in HPI. Married for 15 years, some positional dyspareunia. Working for [employer]. Mammogram 7/17 normal. Has sister and 2 aunts with breast cancer (BRCA negative). Uses Mirena, expiring soon.   Objective:    BP 117/70   Pulse 93   Resp 16   Ht _1  (1.651 m)   Wt 146 lb (66.2 kg)   BMI 24.30 kg/m   General Appearance:    Alert, cooperative, no distress, appears stated age  Head:    Normocephalic, without obvious abnormality, atraumatic  Eyes:    PERRL, conjunctiva/corneas clear, EOM's intact, fundi    benign, both eyes  Ears:    Normal TM's and external ear canals, both ears  Nose:   Nares normal, septum midline, mucosa normal, no drainage    or sinus tenderness  Throat:   Lips, mucosa, and tongue normal; teeth and gums normal  Neck:   Supple, symmetrical, trachea midline, no adenopathy;    thyroid:  no enlargement/tenderness/nodules; no carotid   bruit  or JVD  Back:     Symmetric, no curvature, ROM normal, no CVA tenderness  Lungs:     Clear to auscultation bilaterally, respirations unlabored  Chest Wall:    No tenderness or deformity   Heart:    Regular rate and rhythm, S1 and S2 normal, no murmur, rub   or gallop  Breast Exam:    No tenderness, masses, or nipple abnormality  Abdomen:     Soft, non-tender, bowel sounds active all four quadrants,    no masses, no organomegaly  Genitalia:    Normal female without lesion, discharge or tenderness, Mirena easily removed and noted to be intact, NSSA, NT, no adnexal masses palpated     Extremities:   Extremities normal, atraumatic, no cyanosis or edema  Pulses:   2+ and symmetric all extremities  Skin:   Skin color, texture, turgor normal, no rashes or lesions  Lymph nodes:   Cervical, supraclavicular, and axillary nodes normal  Neurologic:   CNII-XII intact, normal strength, sensation and reflexes    throughout   .    Assessment:    Healthy female exam.    Hematochezia   Plan:     Breast self exam technique reviewed and patient encouraged to perform self-exam monthly. Thin prep Pap smear. with cotesting Refer to GI

## 2016-08-06 ENCOUNTER — Ambulatory Visit: Payer: Federal, State, Local not specified - PPO | Admitting: Sports Medicine

## 2016-08-06 LAB — CYTOLOGY - PAP

## 2016-08-18 ENCOUNTER — Ambulatory Visit (INDEPENDENT_AMBULATORY_CARE_PROVIDER_SITE_OTHER): Payer: Federal, State, Local not specified - PPO

## 2016-08-18 ENCOUNTER — Other Ambulatory Visit: Payer: Self-pay | Admitting: Sports Medicine

## 2016-08-18 ENCOUNTER — Encounter: Payer: Self-pay | Admitting: Sports Medicine

## 2016-08-18 DIAGNOSIS — R102 Pelvic and perineal pain: Secondary | ICD-10-CM | POA: Diagnosis not present

## 2016-08-18 DIAGNOSIS — M549 Dorsalgia, unspecified: Secondary | ICD-10-CM

## 2016-08-18 DIAGNOSIS — M51369 Other intervertebral disc degeneration, lumbar region without mention of lumbar back pain or lower extremity pain: Secondary | ICD-10-CM

## 2016-08-18 DIAGNOSIS — R3129 Other microscopic hematuria: Secondary | ICD-10-CM

## 2016-08-18 DIAGNOSIS — M5136 Other intervertebral disc degeneration, lumbar region: Secondary | ICD-10-CM

## 2016-08-18 DIAGNOSIS — M898X8 Other specified disorders of bone, other site: Secondary | ICD-10-CM

## 2016-08-25 DIAGNOSIS — R12 Heartburn: Secondary | ICD-10-CM | POA: Diagnosis not present

## 2016-08-25 DIAGNOSIS — R1310 Dysphagia, unspecified: Secondary | ICD-10-CM | POA: Diagnosis not present

## 2016-08-25 DIAGNOSIS — K219 Gastro-esophageal reflux disease without esophagitis: Secondary | ICD-10-CM | POA: Diagnosis not present

## 2016-08-25 DIAGNOSIS — R1013 Epigastric pain: Secondary | ICD-10-CM | POA: Diagnosis not present

## 2016-08-30 ENCOUNTER — Other Ambulatory Visit: Payer: Self-pay | Admitting: Sports Medicine

## 2016-08-31 ENCOUNTER — Encounter: Payer: Self-pay | Admitting: Rehabilitative and Restorative Service Providers"

## 2016-08-31 ENCOUNTER — Ambulatory Visit (INDEPENDENT_AMBULATORY_CARE_PROVIDER_SITE_OTHER): Payer: Federal, State, Local not specified - PPO | Admitting: Rehabilitative and Restorative Service Providers"

## 2016-08-31 DIAGNOSIS — M545 Low back pain, unspecified: Secondary | ICD-10-CM

## 2016-08-31 DIAGNOSIS — R29898 Other symptoms and signs involving the musculoskeletal system: Secondary | ICD-10-CM | POA: Diagnosis not present

## 2016-08-31 NOTE — Patient Instructions (Signed)
Self massage using ~4 inch rubber ball   Abdominal Bracing With Pelvic Floor (Hook-Lying)    With neutral spine, tighten pelvic floor and abdominals sucking belly button to back bone; tighten muscles in low back at waist. Hold 10 sec  Repeat __10_ times. Do _several times a day. Progress to do this in sitting; standing; walking and with functional activities  Trunk: Prone Extension (Press-Ups)    Lie on stomach on firm, flat surface. Relax bottom and legs. Raise chest in air with elbows straight. Keep hips flat on surface, sag stomach. Hold _2-3___ seconds. Repeat __10__ times. Do __2-3__ sessions per day. CAUTION: Movement should be gentle and slow.    Quads / HF, Supine   Lie near edge of bed, pull both knees up toward chest. Hold one knee as you drop the other leg off the edge of the bed.  Relax hanging knee/can bend knee back if indicated. Hold 30 seconds. Repeat 3 times per session. Do 2-3 sessions per day.  Standing stretch for hip flexor 20-30 sec hold as needed    TENS UNIT: This is helpful for muscle pain and spasm.   Search and Purchase a TENS 7000 2nd edition at www.tenspros.com. It should be less than $30.     TENS unit instructions: Do not shower or bathe with the unit on Turn the unit off before removing electrodes or batteries If the electrodes lose stickiness add a drop of water to the electrodes after they are disconnected from the unit and place on plastic sheet. If you continued to have difficulty, call the TENS unit company to purchase more electrodes. Do not apply lotion on the skin area prior to use. Make sure the skin is clean and dry as this will help prolong the life of the electrodes. After use, always check skin for unusual red areas, rash or other skin difficulties. If there are any skin problems, does not apply electrodes to the same area. Never remove the electrodes from the unit by pulling the wires. Do not use the TENS unit or electrodes  other than as directed. Do not change electrode placement without consultating your therapist or physician. Keep 2 fingers with between each electrode.

## 2016-08-31 NOTE — Therapy (Signed)
[facility] [address] [place], [place], [address] Phone: [phone]   Fax:  [phone]  Physical Therapy Evaluation  Patient Details  Name: Dawn [name] MRN: [id number] Date of Birth: 37/08/1981 Referring Provider: Dr Benjamin Stain   Encounter Date: 08/31/2016      [name] End of Session - 08/31/16 1227    Visit Number 1   Number of Visits 12   Date for [name] Re-Evaluation 10/12/16   [name] Start Time 0715   [name] Stop Time 0809   [name] Time Calculation (min) 54 min   Activity Tolerance Patient tolerated treatment well      Past Medical History:  Diagnosis Date  . Hip pain   . Right tennis elbow   . Thyroid goiter    benign    Past Surgical History:  Procedure Laterality Date  . TONSILLECTOMY  1986    There were no vitals filed for this visit.       Subjective Assessment - 08/31/16 0719    Subjective Patient reports that she has a protruding disc in the lumbar disc area with MRI confirming DDD. She has had LBP for the past 2-3 months. She reports falling backward about the time the pain started. Symptoms have persisted with no change in the past 2-3 months.    Pertinent History denies any medical or musculoskeletal problems    How long can you sit comfortably? 30-60 min    How long can you stand comfortably? 5-10 min    How long can you walk comfortably? with any walking    Diagnostic tests MRI   Patient Stated Goals strengthening back and get rid of the pain    Currently in Pain? Yes   Pain Score 4    Pain Location Back   Pain Orientation Lower;Right;Left   Pain Descriptors / Indicators Tightness  stiffness    Pain Type Acute pain   Pain Radiating Towards into both hips    Pain Onset More than a month ago   Pain Frequency Intermittent   Aggravating Factors  standing and walking; lifting; up and down stairss; carrying daughter(5 yr old 4 #)   Pain Relieving Factors stretching; elevating feet some; heat             [facility] [name] Assessment - 08/31/16 0001      Assessment   Medical Diagnosis Lumbar DDD    Referring Provider Dr Benjamin Stain    Onset Date/Surgical Date 05/28/16   Hand Dominance Right   Next MD Visit none scheduled - return prn   Prior Therapy none     Precautions   Precautions None     Balance Screen   Has the patient fallen in the past 6 months Yes   How many times? 1   Has the patient had a decrease in activity level because of a fear of falling?  No   Is the patient reluctant to leave their home because of a fear of falling?  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    Leisure household chores; child care      Observation/Other Assessments   Focus on Therapeutic Outcomes (FOTO)  46% limitation  Sensation   Additional Comments WFL's      Posture/Postural Control   Posture Comments some head forward posture with shoulders rounded and elevated; scapulae abducted and rotated along the thoracic wall; arms in IR at sides; knees hyperextended     AROM   Lumbar Flexion 90%   Lumbar Extension 60%  discomfort bilat pelvic crests    Lumbar - Right Side Bend 85%   Lumbar - Left Side Bend 85%   Lumbar - Right Rotation 70%  discomfort Lt side    Lumbar - Left Rotation 70%     Strength   Overall Strength Comments 5/5 bilat LE's      Flexibility   Soft Tissue Assessment /Muscle Length --  significant tightness Rt > Lt hip flexors    Hamstrings tight ~75-80 deg bilat    Quadriceps WFL   ITB WFL   Piriformis tight Rt > Lt      Palpation   Spinal mobility tightness and discomfort CPA and UPA lumbar spine    Palpation comment tightness iliopsoas; transverse abdominals; lats; hip abductors Rt > Lt ;       Special Tests    Special Tests --  (+) Thomas test bilat Rt > Lt                    [facility] Adult [name] Treatment/Exercise - 08/31/16 0001      Therapeutic Activites    Therapeutic Activities --  myofacial ball release hip flexors      Lumbar Exercises: Stretches   Hip Flexor Stretch 3 reps;30 seconds  lowering one LE off table opposite hip/knee bent    Hip Flexor Stretch Limitations standing hip flexor stretch at counter stretching 20-30 sec x 2 each side    Press Ups --  2-3 sec x 10     Lumbar Exercises: Supine   Ab Set --  3 part core 10 sec x 10      Moist Heat Therapy   Number Minutes Moist Heat 20 Minutes   Moist Heat Location Lumbar Spine;Hip  anterior - hips/lower abs      Electrical Stimulation   Electrical Stimulation Location bilat lumbar; bilat sartorius    Electrical Stimulation Action premod   Electrical Stimulation Parameters to tolerance    Electrical Stimulation Goals Pain;Tone                [name] Education - 08/31/16 0750    Education provided Yes   Education Details HEP; TENS    Person(s) Educated Patient   Methods Explanation;Demonstration;Tactile cues;Verbal cues;Handout   Comprehension Verbalized understanding;Returned demonstration;Verbal cues required;Tactile cues required             [name] Long Term Goals - 08/31/16 1234      [name] LONG TERM GOAL #1   Title Improve tissue extensibility through bilat hips and lumbar spine with patient to demo improve moblity with testing and stretching 10/12/16   Time 6   Period Weeks   Status New     [name] LONG TERM GOAL #2   Title Patient to report decreased pain with functional activities - including carrying 37 yr old daughter up the stairs to bed 10/12/16   Time 6   Period Weeks   Status New     [name] LONG TERM GOAL #3   Title Patient has decreased tenderness and tightness to palpatiion through the hip flexors bilat 10/12/16   Time 6   Period Weeks   Status New  [name] LONG TERM GOAL  #4   Title Independent in HEP 10/12/16   Time 6   Period Weeks   Status New     [name] LONG TERM GOAL #5   Title Improve FOTO to </=295 limitation 10/12/16   Time 6   Period Weeks   Status New               Plan - 08/31/16 1228    Clinical Impression Statement Dawn [name] presents with 2-3 month history of LBP following a fall backward striking a door ordoor facing. She has pain on an intermittent basis with pain in the LB area radiating into both hips. She describs the pain as stiffness and tightness which is increased with prolonged positions. Patient has some postural changes; limited trunk and LE mobilty and ORM; significant tightness through the iliopsoas and superficial hip flexors as well as the transverse abdominals and into the LB in QL and lats. She has continued intermittent pain and limted functional activity level.    Rehab Potential Good   [name] Frequency 2x / week   [name] Duration 6 weeks   [name] Treatment/Interventions Patient/family education;ADLs/Self Care Home Management;Neuromuscular re-education;Cryotherapy;Electrical Stimulation;Iontophoresis 4mg /ml Dexamethasone;Moist Heat;Traction;Ultrasound;Dry needling;Manual techniques;Therapeutic activities;Therapeutic exercise   [name] Next Visit Plan manual work through the deep and superficial hip flexors to release tightness; ball release to hip flexors in prone; add stretches for quads and hamstrings; progress core stabilization; continue modalities as indicated. no discussionof TDN but may benefit from TDN if she does not respond to plan of care    Consulted and Agree with Plan of Care Patient      Patient will benefit from skilled therapeutic intervention in order to improve the following deficits and impairments:  Postural dysfunction, Improper body mechanics, Pain, Decreased range of motion, Decreased mobility, Increased fascial restricitons, Increased muscle spasms, Decreased activity tolerance  Visit Diagnosis: Bilateral low back  pain without sciatica - Plan: [name] plan of care cert/re-cert  Other symptoms and signs involving the musculoskeletal system - Plan: [name] plan of care cert/re-cert     Problem List Patient Active Problem List   Diagnosis Date Noted  . Abdominal pain, epigastric 08/04/2016  . Microscopic hematuria 07/14/2016  . Lumbar degenerative disc disease 05/25/2016  . Varicose vein of leg 06/04/2015  . Patellofemoral pain syndrome 06/04/2015  . Baker's cyst of right knee 02/19/2015  . Overactive bladder 09/24/2014  . Depression 08/27/2014  . Myofascial pain syndrome 08/27/2014  . Family history of breast cancer 09/12/2013  . Well woman exam 07/28/2012  . Goiter, nontoxic, multinodular 10/19/2008    Dawn [name], Dawn [name]  08/31/2016, 12:43 PM  [facility] [address] [place], [place], [address] Phone: [phone]   Fax:  [phone]  Name: Dawn [name] MRN: [id number] Date of Birth: January 18, 1981

## 2016-09-07 ENCOUNTER — Encounter: Payer: Self-pay | Admitting: Rehabilitative and Restorative Service Providers"

## 2016-09-07 ENCOUNTER — Ambulatory Visit (INDEPENDENT_AMBULATORY_CARE_PROVIDER_SITE_OTHER): Payer: Federal, State, Local not specified - PPO | Admitting: Rehabilitative and Restorative Service Providers"

## 2016-09-07 ENCOUNTER — Encounter: Payer: Self-pay | Admitting: Sports Medicine

## 2016-09-07 DIAGNOSIS — R29898 Other symptoms and signs involving the musculoskeletal system: Secondary | ICD-10-CM | POA: Diagnosis not present

## 2016-09-07 DIAGNOSIS — M545 Low back pain, unspecified: Secondary | ICD-10-CM

## 2016-09-07 NOTE — Patient Instructions (Signed)
Working on iliopsoas/hip flexors As well as quadratus lumborum and lats  Self massage using ~4 inch rubber ball lying on stomach to work the hip flexors - can also do in standing for hip flexors and back   HIP: Hamstrings - Supine   Place strap around foot. Raise leg up, keeping knee straight.  Bend opposite knee to protect back if indicated. Hold 30 seconds. 3 reps per set, 2-3 sets per day     Outer Hip Stretch: Reclined IT Band Stretch (Strap)   Strap around one foot, pull leg across body until you feel a pull or stretch, with shoulders on mat. Hold for 30 seconds. Repeat 3 times each leg. 2-3 times/day.  Can also stretch leg in toward the opposite leg in straight leg position  Quads / HF, Prone   Lie face down. Grasp one ankle with same-side hand. Use towel if needed to reach. Gently pull foot toward buttock.  Hold 30 seconds. Repeat 3 times per session. Do 2-3 sessions per day.   Cat / Cow Flow    Inhale, press spine toward ceiling like a Halloween cat. Keeping strength in arms and abdominals, exhale to soften spine through neutral and into cow pose. Open chest and arch back. Initiate movement between cat and cow at tailbone, one vertebrae at a time. Repeat ___5_ times.   BACK: Child's Pose (Sciatica)    Arch back in angry cat position, sit back to heels. Hold position for __20-30 sec Repeat __3-5 _ times. Do _2-3__ times per day.

## 2016-09-07 NOTE — Therapy (Signed)
[facility] [place] [address] [place], [place], [address] Phone: [phone]   Fax:  [phone]  Physical Therapy Treatment  Patient Details  Name: Dawn [name] MRN: [id number] Date of Birth: 30-Dec-1980 Referring Provider: Dr Benjamin Stain   Encounter Date: 09/07/2016      PT End of Session - 09/07/16 0722    Visit Number 2   Number of Visits 12   Date for PT Re-Evaluation 10/12/16   PT Start Time 0722   PT Stop Time 0818   PT Time Calculation (min) 56 min   Activity Tolerance Patient tolerated treatment well      Past Medical History:  Diagnosis Date  . Hip pain   . Right tennis elbow   . Thyroid goiter    benign    Past Surgical History:  Procedure Laterality Date  . TONSILLECTOMY  1986    There were no vitals filed for this visit.      Subjective Assessment - 09/07/16 0725    Subjective Exercises are going OK some difficulty with hip flexor stretch due to height of bed. Hips seems to feel better but she has continued constant "stiffness" in the low back.    Currently in Pain? Yes   Pain Score 6    Pain Location Back   Pain Orientation Lower;Right;Left   Pain Descriptors / Indicators Tightness   Pain Type Acute pain   Pain Onset More than a month ago   Pain Frequency Intermittent                         [facility] Adult PT Treatment/Exercise - 09/07/16 0001      Therapeutic Activites    Therapeutic Activities --  myofacial ball release hip flexors-standing for flexors/LB     Lumbar Exercises: Stretches   Passive Hamstring Stretch 3 reps;30 seconds   Hip Flexor Stretch 3 reps;30 seconds  lowering one LE off table opposite hip/knee bent    Press Ups --  2-3 sec x 10   Quadruped Mid Back Stretch 3 reps;30 seconds  childs pose   Quad Stretch 3 reps;30 seconds   ITB Stretch 1 rep;30 seconds     Lumbar Exercises: Supine   Ab Set --  3 part core 10 sec x 10      Lumbar  Exercises: Quadruped   Madcat/Old Horse 10 reps   Madcat/Old Horse Limitations difficulty with smooth controlled movement      Moist Heat Therapy   Number Minutes Moist Heat 20 Minutes   Moist Heat Location Lumbar Spine;Hip  anterior - hips/lower abs      Electrical Stimulation   Electrical Stimulation Location bilat lumbar    Electrical Stimulation Action IFC   Electrical Stimulation Parameters to tolerance   Electrical Stimulation Goals Pain;Tone     Manual Therapy   Manual therapy comments pt supine hooklying LE's on bolster    Soft tissue mobilization wrking through bilateral iliopsoas/hip flexors                 PT Education - 09/07/16 0759    Education provided Yes   Education Details HEP    Person(s) Educated Patient   Methods Explanation;Demonstration;Tactile cues;Verbal cues;Handout   Comprehension Verbalized understanding;Returned demonstration;Verbal cues required;Tactile cues required             PT Long Term Goals - 09/07/16 0724      PT LONG TERM GOAL #1   Title Improve  tissue extensibility through bilat hips and lumbar spine with patient to demo improve moblity with testing and stretching 10/12/16   Time 6   Period Weeks   Status On-going     PT LONG TERM GOAL #2   Title Patient to report decreased pain with functional activities - including carrying 894 yr old daughter up the stairs to bed 10/12/16   Time 6   Period Weeks   Status On-going     PT LONG TERM GOAL #3   Title Patient has decreased tenderness and tightness to palpatiion through the hip flexors bilat 10/12/16   Time 6   Period Weeks   Status On-going     PT LONG TERM GOAL #4   Title Independent in HEP 10/12/16   Time 6   Period Weeks   Status On-going     PT LONG TERM GOAL #5   Title Improve FOTO to </=29% limitation 10/12/16   Time 6   Period Weeks   Status On-going               Plan - 09/07/16 0726    Clinical Impression Statement Tightness persists  through the iliopsoas and hip flexors as well as QL and lats. Some improvement in hip tightness. Correctioin of exercise techniques to decrease LB stress due to improper positions with three part core and hip flexor stretch. Note less tightness to palpation through the hip flexors(iliopsoas). No goals accomplished - only second visit.    Rehab Potential Good   PT Frequency 2x / week   PT Duration 6 weeks   PT Treatment/Interventions Patient/family education;ADLs/Self Care Home Management;Neuromuscular re-education;Cryotherapy;Electrical Stimulation;Iontophoresis 4mg /ml Dexamethasone;Moist Heat;Traction;Ultrasound;Dry needling;Manual techniques;Therapeutic activities;Therapeutic exercise   PT Next Visit Plan manual work through the deep and superficial hip flexors to release tightness; progress core stabilization; further assessment of SI as indicated; continue modalities as indicated. no discussion of TDN but may benefit from TDN if she does not respond to plan of care    Consulted and Agree with Plan of Care Patient      Patient will benefit from skilled therapeutic intervention in order to improve the following deficits and impairments:  Postural dysfunction, Improper body mechanics, Pain, Decreased range of motion, Decreased mobility, Increased fascial restricitons, Increased muscle spasms, Decreased activity tolerance  Visit Diagnosis: Bilateral low back pain without sciatica  Other symptoms and signs involving the musculoskeletal system     Problem List Patient Active Problem List   Diagnosis Date Noted  . Abdominal pain, epigastric 08/04/2016  . Microscopic hematuria 07/14/2016  . Lumbar degenerative disc disease 05/25/2016  . Varicose vein of leg 06/04/2015  . Patellofemoral pain syndrome 06/04/2015  . Baker's cyst of right knee 02/19/2015  . Overactive bladder 09/24/2014  . Depression 08/27/2014  . Myofascial pain syndrome 08/27/2014  . Family history of breast cancer  09/12/2013  . Well woman exam 07/28/2012  . Goiter, nontoxic, multinodular 10/19/2008    Ronson Hagins Rober MinionP Vinie Charity PT, MPH  09/07/2016, 8:21 AM  [facility] [address] [place] [address] [place], [place], [address] Phone: [phone]   Fax:  [phone]  Name: Cammy CopaHeather L Heupel MRN: [id number] Date of Birth: 1981-06-20

## 2016-09-08 ENCOUNTER — Encounter: Payer: Self-pay | Admitting: Sports Medicine

## 2016-09-09 ENCOUNTER — Encounter: Payer: Self-pay | Admitting: Rehabilitative and Restorative Service Providers"

## 2016-09-09 ENCOUNTER — Ambulatory Visit (INDEPENDENT_AMBULATORY_CARE_PROVIDER_SITE_OTHER): Payer: Federal, State, Local not specified - PPO | Admitting: Rehabilitative and Restorative Service Providers"

## 2016-09-09 DIAGNOSIS — M545 Low back pain, unspecified: Secondary | ICD-10-CM

## 2016-09-09 DIAGNOSIS — R29898 Other symptoms and signs involving the musculoskeletal system: Secondary | ICD-10-CM

## 2016-09-09 NOTE — Therapy (Signed)
[facility] [place] [address]  [address] [place], [place], [address] Phone: [phone]   Fax:  [phone]  Physical Therapy Treatment  Patient Details  Name: Dawn [name] MRN: [id number] Date of Birth: [birth year] Referring Provider: Dr Benjamin Stain   Encounter Date: 09/09/2016      PT End of Session - 09/09/16 0720    Visit Number 3   Number of Visits 12   Date for PT Re-Evaluation 10/12/16   PT Start Time 0719   PT Stop Time 0812   PT Time Calculation (min) 53 min   Activity Tolerance Patient tolerated treatment well      Past Medical History:  Diagnosis Date  . Hip pain   . Right tennis elbow   . Thyroid goiter    benign    Past Surgical History:  Procedure Laterality Date  . TONSILLECTOMY  1986    There were no vitals filed for this visit.      Subjective Assessment - 09/09/16 0722    Subjective Corrected exercises for HEP. Less aching in the LB.  Still has a dull ach in the LB but no hip pain.    Currently in Pain? Yes   Pain Score 3    Pain Location Back   Pain Orientation Lower;Right;Left   Pain Descriptors / Indicators Aching;Dull   Pain Type Acute pain   Pain Onset More than a month ago   Pain Frequency Intermittent                         [facility] Adult PT Treatment/Exercise - 09/09/16 0001      Lumbar Exercises: Stretches   Passive Hamstring Stretch 3 reps;30 seconds   Hip Flexor Stretch 3 reps;30 seconds  lowering one LE off table opposite hip/knee bent    Quadruped Mid Back Stretch 3 reps;30 seconds  childs pose     Lumbar Exercises: Standing   Other Standing Lumbar Exercises standing hip abduction x10 hip extension x 10 bilat      Lumbar Exercises: Supine   Ab Set --  3 part core 10 sec x 10    Bridge 10 reps     Lumbar Exercises: Quadruped   Madcat/Old Horse 10 reps   Madcat/Old Horse Limitations improving technique   Opposite Arm/Leg Raise Right arm/Left leg;Left  arm/Right leg;20 reps;2 seconds     Moist Heat Therapy   Number Minutes Moist Heat 20 Minutes   Moist Heat Location Lumbar Spine;Hip  anterior - hips/lower abs      Electrical Stimulation   Electrical Stimulation Location bilat lumbar    Electrical Stimulation Action IFC   Electrical Stimulation Parameters to toleranc   Electrical Stimulation Goals Pain;Tone     Manual Therapy   Manual therapy comments pt prone    Soft tissue mobilization bilat lumbar paraspinals; QL; lats Rt tighter than Lt                PT Education - 09/09/16 0743    Education provided Yes   Education Details HEP   Person(s) Educated Patient   Methods Explanation;Demonstration;Tactile cues;Verbal cues;Handout   Comprehension Verbalized understanding;Returned demonstration;Verbal cues required;Tactile cues required             PT Long Term Goals - 09/07/16 0724      PT LONG TERM GOAL #1   Title Improve tissue extensibility through bilat hips and lumbar spine with patient to demo improve moblity with testing and stretching  10/12/16   Time 6   Period Weeks   Status On-going     PT LONG TERM GOAL #2   Title Patient to report decreased pain with functional activities - including carrying 694 yr old daughter up the stairs to bed 10/12/16   Time 6   Period Weeks   Status On-going     PT LONG TERM GOAL #3   Title Patient has decreased tenderness and tightness to palpatiion through the hip flexors bilat 10/12/16   Time 6   Period Weeks   Status On-going     PT LONG TERM GOAL #4   Title Independent in HEP 10/12/16   Time 6   Period Weeks   Status On-going     PT LONG TERM GOAL #5   Title Improve FOTO to </=29% limitation 10/12/16   Time 6   Period Weeks   Status On-going               Plan - 09/09/16 0727    Clinical Impression Statement Less aching in the LB - no pain in the hip. Responding well to exercise and modalities. Progressing well toward stated goals of therapy.     Rehab Potential Good   PT Frequency 2x / week   PT Duration 6 weeks   PT Treatment/Interventions Patient/family education;ADLs/Self Care Home Management;Neuromuscular re-education;Cryotherapy;Electrical Stimulation;Iontophoresis 4mg /ml Dexamethasone;Moist Heat;Traction;Ultrasound;Dry needling;Manual techniques;Therapeutic activities;Therapeutic exercise   PT Next Visit Plan manual work through the deep and superficial hip flexors to release tightness; progress core stabilization; further assessment of SI as indicated; continue modalities as indicated. no discussion of TDN but may benefit from TDN if she does not respond to plan of care    Consulted and Agree with Plan of Care Patient      Patient will benefit from skilled therapeutic intervention in order to improve the following deficits and impairments:  Postural dysfunction, Improper body mechanics, Pain, Decreased range of motion, Decreased mobility, Increased fascial restricitons, Increased muscle spasms, Decreased activity tolerance  Visit Diagnosis: Bilateral low back pain without sciatica  Other symptoms and signs involving the musculoskeletal system     Problem List Patient Active Problem List   Diagnosis Date Noted  . Abdominal pain, epigastric 08/04/2016  . Microscopic hematuria 07/14/2016  . Lumbar degenerative disc disease 05/25/2016  . Varicose vein of leg 06/04/2015  . Patellofemoral pain syndrome 06/04/2015  . Baker's cyst of right knee 02/19/2015  . Overactive bladder 09/24/2014  . Depression 08/27/2014  . Myofascial pain syndrome 08/27/2014  . Family history of breast cancer 09/12/2013  . Well woman exam 07/28/2012  . Goiter, nontoxic, multinodular 10/19/2008    Avarose Mervine Rober MinionP Eleanor Dimichele PT, MPH 09/09/2016, 8:05 AM  [facility] [address] [place] [address] [place], [place], [address] Phone: [phone]   Fax:  [phone]  Name: Dawn CopaHeather L [name] MRN: [id number] Date of Birth:  03-01-1981

## 2016-09-09 NOTE — Patient Instructions (Addendum)
Side Twist, Kneeling    Kneel, buttocks on heels. Fold upper body forward from hips. Then reach to each side as far as possible, keeping chest low toward floor. Hold each position __10-15_ seconds. Repeat _3 times per session. Do __1_ sessions per day.     Upper / Lower Extremity Extension (All-Fours)    Tighten core and raise right leg and opposite arm. Keep trunk rigid. Repeat _10___ times per set. Do _1-3___ sets per session. Do __1_ sessions per day.     Bridging    Slowly raise buttocks from floor, keeping stomach tight. Repeat _10_ times per set. Do __1-3__ sets per session. Do __1_ sessions per day.    Combination (Hook-Lying)    Tighten stomach and slowly raise left leg and lower opposite arm over head. Keep trunk rigid. Repeat _10___ times per set. Do __1-3__ sets per session. Do _1___ sessions per day.   Strengthening: Hip Abduction - Resisted    With tubing around right leg, other side toward anchor, extend leg out from side. Repeat __10__ times per set. Do __1-3__ sets per session. Do __1__ sessions per day.    Strengthening: Hip Extension - Resisted    With tubing around right ankle, face anchor and pull leg straight back. Repeat __10__ times per set. Do __1-3__ sets per session. Do __1__ sessions per day.

## 2016-09-16 ENCOUNTER — Ambulatory Visit (INDEPENDENT_AMBULATORY_CARE_PROVIDER_SITE_OTHER): Payer: Federal, State, Local not specified - PPO | Admitting: Physical Therapy

## 2016-09-16 DIAGNOSIS — R29898 Other symptoms and signs involving the musculoskeletal system: Secondary | ICD-10-CM | POA: Diagnosis not present

## 2016-09-16 DIAGNOSIS — M545 Low back pain, unspecified: Secondary | ICD-10-CM

## 2016-09-16 NOTE — Therapy (Signed)
[place], [place], [address] Phone: [phone]   Fax:  [phone]  Physical Therapy Treatment  Patient Details  Name: Dawn [name] MRN: [id number] Date of Birth: 12-08-1981 Referring Provider: Dr Dianah Field   Encounter Date: 09/16/2016      PT End of Session - 09/16/16 0721    Visit Number 4   Number of Visits 12   Date for PT Re-Evaluation 10/12/16   PT Start Time 0719   PT Stop Time 0822   PT Time Calculation (min) 63 min   Activity Tolerance Patient tolerated treatment well      Past Medical History:  Diagnosis Date  . Hip pain   . Right tennis elbow   . Thyroid goiter    benign    Past Surgical History:  Procedure Laterality Date  . TONSILLECTOMY  1986    There were no vitals filed for this visit.      Subjective Assessment - 09/16/16 0722    Subjective Pt reports her pain is not as painful as it once was.  She reports the trunk ext exercises have helped.   Carrying her daughter is getting easier, but she has altered the way she carries her.     Currently in Pain? Yes   Pain Score 3    Pain Location Hip   Pain Orientation Lateral;Anterior;Right;Left   Pain Descriptors / Indicators Dull;Sharp   Pain Relieving Factors stretching            [facility] PT Assessment - 09/16/16 0001      Flexibility   Hamstrings 60-65 deg, with discomfort.           [place] Adult PT Treatment/Exercise - 09/16/16 0001      Lumbar Exercises: Stretches   Passive Hamstring Stretch 3 reps;30 seconds   Hip Flexor Stretch 3 reps;30 seconds  lowering one LE off table opposite hip/knee bent      Lumbar Exercises: Aerobic   Stationary Bike NuStep L5: 5 min      Lumbar Exercises: Standing   Other Standing Lumbar Exercises standing hip abduction x10 each leg, with back against wall and heel sliding against wall.      Lumbar Exercises: Prone   Opposite Arm/Leg Raise Right arm/Left  leg;Left arm/Right leg;5 reps  3 sets   Other Prone Lumbar Exercises childs pose x 20 sec x 2 reps     Lumbar Exercises: Quadruped   Madcat/Old Horse 5 reps     Moist Heat Therapy   Number Minutes Moist Heat 15 Minutes   Moist Heat Location Lumbar Spine;Hip     Electrical Stimulation   Electrical Stimulation Action IFC   Electrical Stimulation Parameters to tolerance    Electrical Stimulation Goals Tone;Pain      Manual therapy:  MFR to Rt/ Lt iliopsoas, Lt TA/ QL to decrease pain and fascial tightness.  Pt in hooklying.  Lt much tighter/ tender than Rt.           PT Long Term Goals - 09/16/16 0743      PT LONG TERM GOAL #1   Title Improve tissue extensibility through bilat hips and lumbar spine with patient to demo improve moblity with testing and stretching 10/12/16   Time 6   Period Weeks   Status On-going     PT LONG TERM GOAL #2   Title Patient to report decreased pain with functional activities - including carrying 40 yr old daughter up the stairs  to bed 10/12/16   Time 6   Period Weeks   Status Partially Met  with lifting modification     PT LONG TERM GOAL #3   Title Patient has decreased tenderness and tightness to palpatiion through the hip flexors bilat 10/12/16   Time 6   Period Weeks   Status On-going     PT LONG TERM GOAL #4   Title Independent in HEP 10/12/16   Time 6   Period Weeks   Status On-going     PT LONG TERM GOAL #5   Title Improve FOTO to </=29% limitation 10/12/16   Time 6   Period Weeks   Status On-going               Plan - 09/16/16 0807    Clinical Impression Statement Pt continues with tightness / tenderness with palpation to Lt hip.  She tolerated all exercises well, without increase in pain.  Pt is having less pain with carrying daughter up stairs, but she has also modified her carrying method.   Pt is gradually progressing towards unmet goals.    Rehab Potential Good   PT Frequency 2x / week   PT Duration 6 weeks    PT Treatment/Interventions Patient/family education;ADLs/Self Care Home Management;Neuromuscular re-education;Cryotherapy;Electrical Stimulation;Iontophoresis 12m/ml Dexamethasone;Moist Heat;Traction;Ultrasound;Dry needling;Manual techniques;Therapeutic activities;Therapeutic exercise   PT Next Visit Plan Continue progressive core/ lumbar stabilization.  Manual therapy to iliopsoas/ QL.     Consulted and Agree with Plan of Care Patient      Patient will benefit from skilled therapeutic intervention in order to improve the following deficits and impairments:  Postural dysfunction, Improper body mechanics, Pain, Decreased range of motion, Decreased mobility, Increased fascial restricitons, Increased muscle spasms, Decreased activity tolerance  Visit Diagnosis: Acute bilateral low back pain without sciatica  Other symptoms and signs involving the musculoskeletal system     Problem List Patient Active Problem List   Diagnosis Date Noted  . Abdominal pain, epigastric 08/04/2016  . Microscopic hematuria 07/14/2016  . Lumbar degenerative disc disease 05/25/2016  . Varicose vein of leg 06/04/2015  . Patellofemoral pain syndrome 06/04/2015  . Baker's cyst of right knee 02/19/2015  . Overactive bladder 09/24/2014  . Depression 08/27/2014  . Myofascial pain syndrome 08/27/2014  . Family history of breast cancer 09/12/2013  . Well woman exam 07/28/2012  . Goiter, nontoxic, multinodular 10/19/2008   JKerin Perna PTA 09/16/16 1:37 PM  [facility] [place] [address]Phone: [phone]  Fax:  [phone] Name: Dawn PADOVANOMRN: [id number] of Birth: 511-11-1981

## 2016-09-18 ENCOUNTER — Ambulatory Visit (INDEPENDENT_AMBULATORY_CARE_PROVIDER_SITE_OTHER): Payer: Federal, State, Local not specified - PPO | Admitting: Rehabilitative and Restorative Service Providers"

## 2016-09-18 ENCOUNTER — Encounter: Payer: Self-pay | Admitting: Rehabilitative and Restorative Service Providers"

## 2016-09-18 DIAGNOSIS — M545 Low back pain, unspecified: Secondary | ICD-10-CM

## 2016-09-18 DIAGNOSIS — R29898 Other symptoms and signs involving the musculoskeletal system: Secondary | ICD-10-CM | POA: Diagnosis not present

## 2016-09-18 NOTE — Therapy (Signed)
[facility] [place], [place], [address] Phone: [phone]   Fax:  [phone]  Physical Therapy Treatment  Patient Details  Name: Dawn [name] MRN: [id number] Date of Birth: 05-08-1981 Referring Provider: Dr Dianah Field   Encounter Date: 09/18/2016      PT End of Session - 09/18/16 0804    Visit Number 5   Number of Visits 12   Date for PT Re-Evaluation 10/12/16   PT Start Time 0804   PT Stop Time 0900   PT Time Calculation (min) 56 min   Activity Tolerance Patient tolerated treatment well      Past Medical History:  Diagnosis Date  . Hip pain   . Right tennis elbow   . Thyroid goiter    benign    Past Surgical History:  Procedure Laterality Date  . TONSILLECTOMY  1986    There were no vitals filed for this visit.      Subjective Assessment - 09/18/16 0804    Subjective Working on changing the way she carries her daughter and continues to work on exercises at home. Had a massage yesterday and the massage therpist got into some tight stuff.    Currently in Pain? Yes   Pain Score 4    Pain Location Back   Pain Orientation Left;Lateral   Pain Descriptors / Indicators Sharp;Nagging   Pain Type Acute pain   Pain Radiating Towards Lt hip area into the side    Pain Onset More than a month ago   Pain Frequency Intermittent   Aggravating Factors  standing; walking; up and down stairs; carrying daughter - 40#                          [facility] Adult PT Treatment/Exercise - 09/18/16 0001      Lumbar Exercises: Stretches   Hip Flexor Stretch 3 reps;30 seconds  lowering one LE off table opposite hip/knee bent    Pelvic Tilt Limitations laterla trunk flexioin to the Rt to stretch Lt side 30 sec x 3 over bolster and pillows    Quadruped Mid Back Stretch 3 reps;30 seconds     Lumbar Exercises: Aerobic   Stationary Bike NuStep L5: 5 min      Lumbar Exercises: Quadruped    Madcat/Old Horse 5 reps     Moist Heat Therapy   Number Minutes Moist Heat 20 Minutes   Moist Heat Location Lumbar Spine;Hip  abs     Electrical Stimulation   Electrical Stimulation Location psoas; lateral side/transvewrse abdominals; lumbar spine Lt    Electrical Stimulation Action IFC   Electrical Stimulation Parameters to tolerance   Electrical Stimulation Goals Tone;Pain     Manual Therapy   Manual therapy comments pt sidelying and supine    Soft tissue mobilization Lt lateral trunk; psoas Lt > Rt    Myofascial Release Lt lateral trunk                 PT Education - 09/18/16 0846    Education provided Yes   Education Details HEP   Person(s) Educated Patient   Methods Explanation;Demonstration;Tactile cues;Verbal cues;Handout   Comprehension Verbalized understanding;Returned demonstration;Verbal cues required;Tactile cues required             PT Long Term Goals - 09/16/16 0743      PT LONG TERM GOAL #1   Title Improve tissue extensibility through bilat hips and lumbar spine with patient to  demo improve moblity with testing and stretching 10/12/16   Time 6   Period Weeks   Status On-going     PT LONG TERM GOAL #2   Title Patient to report decreased pain with functional activities - including carrying 50 yr old daughter up the stairs to bed 10/12/16   Time 6   Period Weeks   Status Partially Met  with lifting modification     PT LONG TERM GOAL #3   Title Patient has decreased tenderness and tightness to palpatiion through the hip flexors bilat 10/12/16   Time 6   Period Weeks   Status On-going     PT LONG TERM GOAL #4   Title Independent in HEP 10/12/16   Time 6   Period Weeks   Status On-going     PT LONG TERM GOAL #5   Title Improve FOTO to </=29% limitation 10/12/16   Time 6   Period Weeks   Status On-going               Plan - 09/18/16 0847    Clinical Impression Statement Tightness through Lt >> Rt psoas; Lt obliques. Good  response to manual work and stretching. pain free post treatment.    Rehab Potential Good   PT Frequency 2x / week   PT Duration 6 weeks   PT Treatment/Interventions Patient/family education;ADLs/Self Care Home Management;Neuromuscular re-education;Cryotherapy;Electrical Stimulation;Iontophoresis 24m/ml Dexamethasone;Moist Heat;Traction;Ultrasound;Dry needling;Manual techniques;Therapeutic activities;Therapeutic exercise   PT Next Visit Plan Continue progressive core/ lumbar stabilization.  Manual therapy to iliopsoas/ QL.  May try TDN to opliques if tightness persists    Consulted and Agree with Plan of Care Patient      Patient will benefit from skilled therapeutic intervention in order to improve the following deficits and impairments:  Postural dysfunction, Improper body mechanics, Pain, Decreased range of motion, Decreased mobility, Increased fascial restricitons, Increased muscle spasms, Decreased activity tolerance  Visit Diagnosis: Acute bilateral low back pain without sciatica  Other symptoms and signs involving the musculoskeletal system     Problem List Patient Active Problem List   Diagnosis Date Noted  . Abdominal pain, epigastric 08/04/2016  . Microscopic hematuria 07/14/2016  . Lumbar degenerative disc disease 05/25/2016  . Varicose vein of leg 06/04/2015  . Patellofemoral pain syndrome 06/04/2015  . Baker's cyst of right knee 02/19/2015  . Overactive bladder 09/24/2014  . Depression 08/27/2014  . Myofascial pain syndrome 08/27/2014  . Family history of breast cancer 09/12/2013  . Well woman exam 07/28/2012  . Goiter, nontoxic, multinodular 10/19/2008    Celyn PNilda SimmerPT, MPH  09/18/2016, 8:49 AM  [facility] [place] [address]Phone: [phone]  Fax:  [phone] Name: Dawn BUSWELLMRN: [id number] of Birth: [birth year]

## 2016-09-18 NOTE — Patient Instructions (Addendum)
Side Bend, Sitting Yoga   Side Bend, Sitting    Sit on sofa or bed; feet on floor; bolster and/or pillows along right side. Bend to right stretching Lt side  Hold _30-45__ seconds. To increase stretch, raise other arm above head.  Repeat _3__ times per session. Do _2-3__ sessions per day.

## 2016-09-21 ENCOUNTER — Encounter: Payer: Federal, State, Local not specified - PPO | Admitting: Rehabilitative and Restorative Service Providers"

## 2016-09-23 ENCOUNTER — Ambulatory Visit (INDEPENDENT_AMBULATORY_CARE_PROVIDER_SITE_OTHER): Payer: Federal, State, Local not specified - PPO | Admitting: Physical Therapy

## 2016-09-23 DIAGNOSIS — M545 Low back pain, unspecified: Secondary | ICD-10-CM

## 2016-09-23 DIAGNOSIS — R29898 Other symptoms and signs involving the musculoskeletal system: Secondary | ICD-10-CM

## 2016-09-23 NOTE — Therapy (Signed)
[facility] [place], [place], [address] Phone: [phone]   Fax:  [phone]  Physical Therapy Treatment  Patient Details  Name: VERGINIA TOOHEY MRN: [id number] Date of Birth: [birth year] Referring Provider: Dr. Helane Rima   Encounter Date: 09/23/2016      PT End of Session - 09/23/16 4401    Visit Number 6   Number of Visits 12   Date for PT Re-Evaluation 10/12/16   PT Start Time 0720   PT Stop Time 0820   PT Time Calculation (min) 60 min      Past Medical History:  Diagnosis Date  . Hip pain   . Right tennis elbow   . Thyroid goiter    benign    Past Surgical History:  Procedure Laterality Date  . TONSILLECTOMY  1986    There were no vitals filed for this visit.      Subjective Assessment - 09/23/16 0802    Subjective Pt reports the area on Lt hip is "nagging", worse with walking and driving.  She has not been able to replicate the stretch she did last session in therapy. No real change.     Currently in Pain? Yes   Pain Score 4    Pain Location Back   Pain Orientation Left;Lateral;Anterior   Pain Descriptors / Indicators Nagging   Aggravating Factors  driving, walking    Pain Relieving Factors TENS            [facility] PT Assessment - 09/23/16 0001      Assessment   Medical Diagnosis Lumbar DDD    Referring Provider Dr. Helane Rima    Onset Date/Surgical Date 05/28/16   Hand Dominance Right   Next MD Visit none scheduled - return prn          [facility] Adult PT Treatment/Exercise - 09/23/16 0001      Lumbar Exercises: Stretches   Hip Flexor Stretch 3 reps;20 seconds  kneeling with arm over head    Pelvic Tilt Limitations lateral trunk flexioin to the Rt to stretch Lt side 30 sec x 3 over bolster and pillows      Lumbar Exercises: Aerobic   Stationary Bike NuStep L5: 5 min      Moist Heat Therapy   Number Minutes Moist Heat 20 Minutes   Moist Heat Location Lumbar  Spine;Hip  abs     Electrical Stimulation   Electrical Stimulation Location psoas; lateral side/transverse abdominals; lumbar spine Lt    Electrical Stimulation Action IFC   Electrical Stimulation Parameters  to tolerance   Electrical Stimulation Goals Pain     Manual Therapy   Manual Therapy Soft tissue mobilization;Myofascial release;Taping   Manual therapy comments pt sidelying and supine    Soft tissue mobilization Lt lateral trunk; Lt iliopsoas; TPR to thoracic paraspinals   Myofascial Release Lt lateral trunk, lateral distal thigh, Lt psoas   Kinesiotex Inhibit Muscle;Create Space     [employer] Star shape of I strips (Rock tape) applied to [employer] hip with 20% stretch to decompress tissue and decrease pain.    Inhibit Muscle  I strip at 10% stretch placed over Lt lateral hip and Lt lumbar paraspinals.                      PT Long Term Goals - 09/16/16 0743      PT LONG TERM GOAL #1   Title Improve tissue extensibility through bilat  hips and lumbar spine with patient to demo improve moblity with testing and stretching 10/12/16   Time 6   Period Weeks   Status On-going     PT LONG TERM GOAL #2   Title Patient to report decreased pain with functional activities - including carrying 77 yr old daughter up the stairs to bed 10/12/16   Time 6   Period Weeks   Status Partially Met  with lifting modification     PT LONG TERM GOAL #3   Title Patient has decreased tenderness and tightness to palpatiion through the hip flexors bilat 10/12/16   Time 6   Period Weeks   Status On-going     PT LONG TERM GOAL #4   Title Independent in HEP 10/12/16   Time 6   Period Weeks   Status On-going     PT LONG TERM GOAL #5   Title Improve FOTO to </=29% limitation 10/12/16   Time 6   Period Weeks   Status On-going               Plan - 09/23/16 1307    Clinical Impression Statement Continued fascial tightness and tenderness along Lt obliques,  psoas, lumbar paraspinals.  Eased some with manual therapy.  Trial of Rock tape applied to Lt lateral trunk for pain relief / increased body awareness.  No new goals met yet.      Rehab Potential Good   PT Frequency 2x / week   PT Duration 6 weeks   PT Treatment/Interventions Patient/family education;ADLs/Self Care Home Management;Neuromuscular re-education;Cryotherapy;Electrical Stimulation;Iontophoresis 55m/ml Dexamethasone;Moist Heat;Traction;Ultrasound;Dry needling;Manual techniques;Therapeutic activities;Therapeutic exercise   PT Next Visit Plan Continue progressive core/ lumbar stabilization.  Manual therapy to iliopsoas/ QL.  May try TDN to opliques if tightness persists    Consulted and Agree with Plan of Care Patient      Patient will benefit from skilled therapeutic intervention in order to improve the following deficits and impairments:  Postural dysfunction, Improper body mechanics, Pain, Decreased range of motion, Decreased mobility, Increased fascial restricitons, Increased muscle spasms, Decreased activity tolerance  Visit Diagnosis: Acute bilateral low back pain without sciatica  Other symptoms and signs involving the musculoskeletal system     Problem List Patient Active Problem List   Diagnosis Date Noted  . Abdominal pain, epigastric 08/04/2016  . Microscopic hematuria 07/14/2016  . Lumbar degenerative disc disease 05/25/2016  . Varicose vein of leg 06/04/2015  . Patellofemoral pain syndrome 06/04/2015  . Baker's cyst of right knee 02/19/2015  . Overactive bladder 09/24/2014  . Depression 08/27/2014  . Myofascial pain syndrome 08/27/2014  . Family history of breast cancer 09/12/2013  . Well woman exam 07/28/2012  . Goiter, nontoxic, multinodular 10/19/2008   JKerin Perna PTA 09/23/16 1:13 PM   [facility] [place] [address]Phone: [phone]  Fax:   [phone] Name: HCINDERELLA CHRISTOFFERSENMRN: [id number] of Birth: 503/30/1982

## 2016-09-24 ENCOUNTER — Ambulatory Visit (INDEPENDENT_AMBULATORY_CARE_PROVIDER_SITE_OTHER): Payer: Federal, State, Local not specified - PPO | Admitting: Sports Medicine

## 2016-09-24 DIAGNOSIS — M5136 Other intervertebral disc degeneration, lumbar region: Secondary | ICD-10-CM | POA: Diagnosis not present

## 2016-09-24 DIAGNOSIS — M791 Myalgia: Secondary | ICD-10-CM | POA: Diagnosis not present

## 2016-09-24 DIAGNOSIS — M51369 Other intervertebral disc degeneration, lumbar region without mention of lumbar back pain or lower extremity pain: Secondary | ICD-10-CM

## 2016-09-24 DIAGNOSIS — M7918 Myalgia, other site: Secondary | ICD-10-CM

## 2016-09-24 MED ORDER — PREGABALIN 25 MG PO CAPS: ORAL_CAPSULE | ORAL | 0 refills | Status: DC

## 2016-09-24 NOTE — Assessment & Plan Note (Signed)
Overall doing well, we did fill out disability forms for the [place] today. Return to see me on an as-needed basis.

## 2016-09-24 NOTE — Progress Notes (Signed)
  Subjective:    CC: Needs forms filled out  HPI: This is a pleasant 35 year old female, she is here to have forms filled out from her injury at work in June.  She also has a day, has been down tapering her Lyrica, discontinued from 75 mg and developed odd tingly sensation on both cheeks, and a dull throbbing headache with photophobia and phonophobia. No nausea, no localizing neurologic symptoms. No visual changes. No head trauma, no constitutional symptoms.  Past medical history:  Negative.  See flowsheet/record as well for more information.  Surgical history: Negative.  See flowsheet/record as well for more information.  Family history: Negative.  See flowsheet/record as well for more information.  Social history: Negative.  See flowsheet/record as well for more information.  Allergies, and medications have been entered into the medical record, reviewed, and no changes needed.   Review of Systems: No fevers, chills, night sweats, weight loss, chest pain, or shortness of breath.   Objective:    General: Well Developed, well nourished, and in no acute distress.  Neuro: Alert and oriented x3, extra-ocular muscles intact, sensation grossly intact. Cranial nerves II through XII are intact, motor, sensory, and coordinative functions are all intact. HEENT: Normocephalic, atraumatic, pupils equal round reactive to light, neck supple, no masses, no lymphadenopathy, thyroid nonpalpable.  Skin: Warm and dry, no rashes. Cardiac: Regular rate and rhythm, no murmurs rubs or gallops, no lower extremity edema.  Respiratory: Clear to auscultation bilaterally. Not using accessory muscles, speaking in full sentences.  We spent some time filling out her work forms.  Impression and Recommendations:    Lumbar degenerative disc disease Overall doing well, we did fill out disability forms for the [place] today. Return to see me on an as-needed basis.  Myofascial pain syndrome Patient desires to taper down  her Lyrica, she did have some eye paresthesias in the face with discontinuing. Next line we are going to get a few Lyrica 25 mg pills.  I spent 25 minutes with this patient, greater than 50% was face-to-face time counseling regarding the above diagnoses

## 2016-09-24 NOTE — Assessment & Plan Note (Signed)
Patient desires to taper down her Lyrica, she did have some eye paresthesias in the face with discontinuing. Next line we are going to get a few Lyrica 25 mg pills.

## 2016-09-25 ENCOUNTER — Other Ambulatory Visit: Payer: Self-pay | Admitting: Sports Medicine

## 2016-09-28 ENCOUNTER — Encounter: Payer: Self-pay | Admitting: Rehabilitative and Restorative Service Providers"

## 2016-09-28 ENCOUNTER — Ambulatory Visit (INDEPENDENT_AMBULATORY_CARE_PROVIDER_SITE_OTHER): Payer: Federal, State, Local not specified - PPO | Admitting: Rehabilitative and Restorative Service Providers"

## 2016-09-28 DIAGNOSIS — M545 Low back pain, unspecified: Secondary | ICD-10-CM

## 2016-09-28 DIAGNOSIS — R29898 Other symptoms and signs involving the musculoskeletal system: Secondary | ICD-10-CM

## 2016-09-28 NOTE — Therapy (Signed)
[facility] [place], [place], [address] Phone: [phone]   Fax:  [phone]  Physical Therapy Treatment  Patient Details  Name: Dawn [name] MRN: [id number] Date of Birth: 1981-01-18 Referring Provider: Dr. Helane Rima   Encounter Date: 09/28/2016      PT End of Session - 09/28/16 0931    Visit Number 7   Number of Visits 12   Date for PT Re-Evaluation 10/12/16   PT Start Time 0845   PT Stop Time 0938   PT Time Calculation (min) 53 min   Activity Tolerance Patient tolerated treatment well      Past Medical History:  Diagnosis Date  . Hip pain   . Right tennis elbow   . Thyroid goiter    benign    Past Surgical History:  Procedure Laterality Date  . TONSILLECTOMY  1986    There were no vitals filed for this visit.      Subjective Assessment - 09/28/16 0850    Subjective Continues to have some intermittent aching and pain in the Lt hip and LB. No longer has the feeling of being unstable in the Lt leg  which was one of her main complaints. Has definitely improved since starting therapy.    Currently in Pain? Yes   Pain Score 2    Pain Orientation Left;Anterior;Lateral   Pain Descriptors / Indicators Aching   Pain Type Acute pain   Pain Onset More than a month ago   Pain Frequency Intermittent                         [facility] Adult PT Treatment/Exercise - 09/28/16 0001      Lumbar Exercises: Stretches   Hip Flexor Stretch 3 reps;20 seconds  kneeling with arm over head    Pelvic Tilt Limitations lateral trunk flexioin to the Rt to stretch Lt side 30 sec x 3 over bolster and pillows    Quadruped Mid Back Stretch 3 reps;30 seconds     Lumbar Exercises: Aerobic   Stationary Bike NuStep L5: 5 min      Lumbar Exercises: Quadruped   Madcat/Old Horse 5 reps     Moist Heat Therapy   Number Minutes Moist Heat 20 Minutes   Moist Heat Location Lumbar Spine;Hip  abs      Electrical Stimulation   Electrical Stimulation Location psoas; lateral side/transverse abdominals; lumbar spine Lt    Electrical Stimulation Action IFC   Electrical Stimulation Parameters to tolerance   Electrical Stimulation Goals Pain;Tone     Manual Therapy   Manual Therapy Soft tissue mobilization;Myofascial release;Taping   Manual therapy comments pt supine hooklying    Soft tissue mobilization Lt lateral trunk; Lt iliopsoas; TPR to obliques at ant pelvis   Myofascial Release Lt lateral trunk           Trigger Point Dry Needling - 09/28/16 0929    Consent Given? Yes   Education Handout Provided Yes   Muscles Treated Upper Body --  Lt obliques x 1 TDN decrese tightness to palpation               PT Education - 09/28/16 0931    Education provided Yes   Education Details TDN   Person(s) Educated Patient   Methods Explanation   Comprehension Verbalized understanding             PT Long Term Goals - 09/28/16 [id number]  PT LONG TERM GOAL #1   Title Improve tissue extensibility through bilat hips and lumbar spine with patient to demo improve moblity with testing and stretching 10/12/16   Time 6   Period Weeks   Status Partially Met     PT LONG TERM GOAL #2   Title Patient to report decreased pain with functional activities - including carrying 29 yr old daughter up the stairs to bed 10/12/16   Time 6   Period Weeks   Status Partially Met     PT LONG TERM GOAL #3   Title Patient has decreased tenderness and tightness to palpatiion through the hip flexors bilat 10/12/16   Time 6   Period Weeks   Status On-going     PT LONG TERM GOAL #4   Title Independent in HEP 10/12/16   Time 6   Period Weeks   Status On-going     PT LONG TERM GOAL #5   Title Improve FOTO to </=29% limitation 10/12/16   Time 6   Period Weeks   Status On-going               Plan - 09/28/16 0931    Clinical Impression Statement Improving. Continued tightness Lt  iliopsoas and obliques good release to TDN today. Left tape in place that remained. Continued improvement. Progressing well toward stated goals of therapy.    Rehab Potential Good   PT Frequency 2x / week   PT Duration 6 weeks   PT Treatment/Interventions Patient/family education;ADLs/Self Care Home Management;Neuromuscular re-education;Cryotherapy;Electrical Stimulation;Iontophoresis 16m/ml Dexamethasone;Moist Heat;Traction;Ultrasound;Dry needling;Manual techniques;Therapeutic activities;Therapeutic exercise   PT Next Visit Plan Continue progressive core/ lumbar stabilization.  Manual therapy to iliopsoas/ QL.  Assess response to TDN to opliques    Consulted and Agree with Plan of Care Patient      Patient will benefit from skilled therapeutic intervention in order to improve the following deficits and impairments:  Postural dysfunction, Improper body mechanics, Pain, Decreased range of motion, Decreased mobility, Increased fascial restricitons, Increased muscle spasms, Decreased activity tolerance  Visit Diagnosis: Acute bilateral low back pain without sciatica  Other symptoms and signs involving the musculoskeletal system     Problem List Patient Active Problem List   Diagnosis Date Noted  . Abdominal pain, epigastric 08/04/2016  . Microscopic hematuria 07/14/2016  . Lumbar degenerative disc disease 05/25/2016  . Varicose vein of leg 06/04/2015  . Patellofemoral pain syndrome 06/04/2015  . Baker's cyst of right knee 02/19/2015  . Overactive bladder 09/24/2014  . Depression 08/27/2014  . Myofascial pain syndrome 08/27/2014  . Family history of breast cancer 09/12/2013  . Well woman exam 07/28/2012  . Goiter, nontoxic, multinodular 10/19/2008    Dawn [name], Dawn [name]  09/28/2016, 9:33 AM  [facility] [address]Phone: [phone]  Fax:  [phone] Name: Dawn LEATHERBURYMRN:  [id number] of Birth: 507-Mar-1982

## 2016-09-28 NOTE — Patient Instructions (Signed)

## 2016-09-30 ENCOUNTER — Encounter: Payer: Self-pay | Admitting: Sports Medicine

## 2016-09-30 DIAGNOSIS — K5900 Constipation, unspecified: Secondary | ICD-10-CM | POA: Diagnosis not present

## 2016-09-30 DIAGNOSIS — R1310 Dysphagia, unspecified: Secondary | ICD-10-CM | POA: Diagnosis not present

## 2016-09-30 DIAGNOSIS — K921 Melena: Secondary | ICD-10-CM | POA: Diagnosis not present

## 2016-09-30 DIAGNOSIS — K2970 Gastritis, unspecified, without bleeding: Secondary | ICD-10-CM | POA: Diagnosis not present

## 2016-10-01 ENCOUNTER — Encounter: Payer: Self-pay | Admitting: Sports Medicine

## 2016-10-02 ENCOUNTER — Encounter: Payer: Self-pay | Admitting: Rehabilitative and Restorative Service Providers"

## 2016-10-02 ENCOUNTER — Ambulatory Visit (INDEPENDENT_AMBULATORY_CARE_PROVIDER_SITE_OTHER): Payer: Federal, State, Local not specified - PPO | Admitting: Rehabilitative and Restorative Service Providers"

## 2016-10-02 DIAGNOSIS — M545 Low back pain, unspecified: Secondary | ICD-10-CM

## 2016-10-02 DIAGNOSIS — R29898 Other symptoms and signs involving the musculoskeletal system: Secondary | ICD-10-CM

## 2016-10-02 NOTE — Therapy (Signed)
[place], [place], [address] Phone: [phone]   Fax:  [phone]  Physical Therapy Treatment  Patient Details  Name: Dawn [name] MRN: [id number] Date of Birth: [birth year] Referring Provider: Dr. Helane Rima   Encounter Date: 10/02/2016      PT End of Session - 10/02/16 0810    Visit Number 8   Number of Visits 12   Date for PT Re-Evaluation 10/12/16   PT Start Time 0803   PT Stop Time 3710   PT Time Calculation (min) 52 min   Activity Tolerance Patient tolerated treatment well      Past Medical History:  Diagnosis Date  . Hip pain   . Right tennis elbow   . Thyroid goiter    benign    Past Surgical History:  Procedure Laterality Date  . TONSILLECTOMY  1986    There were no vitals filed for this visit.      Subjective Assessment - 10/02/16 0813    Subjective Some soreness in that spot in the side following the TDN. Generally feeling better.    Currently in Pain? Yes   Pain Score 2    Pain Location Back   Pain Orientation Left;Anterior;Lateral   Pain Descriptors / Indicators Aching   Pain Type Acute pain                         [facility] Adult PT Treatment/Exercise - 10/02/16 0001      Lumbar Exercises: Stretches   Hip Flexor Stretch 3 reps;20 seconds  kneeling with arm over head    Pelvic Tilt Limitations lateral trunk flexioin to the Rt to stretch Lt side 30 sec x 3 over bolster and pillows    Quadruped Mid Back Stretch 3 reps;30 seconds   Piriformis Stretch 5 reps;30 seconds  travell - varying angles      Lumbar Exercises: Aerobic   Stationary Bike NuStep L5: 5 min      Lumbar Exercises: Sidelying   Hip Abduction 10 reps  plus 10 tap fwd/back touch x 3 each time      Lumbar Exercises: Quadruped   Madcat/Old Horse 5 reps     Moist Heat Therapy   Number Minutes Moist Heat 20 Minutes   Moist Heat Location Lumbar Spine;Hip  abs      Electrical Stimulation   Electrical Stimulation Location psoas; lateral side/transverse abdominals; lumbar spine Lt    Electrical Stimulation Action IFC   Electrical Stimulation Parameters to tolerance   Electrical Stimulation Goals Pain;Tone     Manual Therapy   Manual Therapy Soft tissue mobilization;Myofascial release;Taping   Manual therapy comments pt supine hooklying    Soft tissue mobilization Lt lateral trunk; Lt iliopsoas; TPR to obliques at ant pelvis   Myofascial Release Lt lateral trunk                 PT Education - 10/02/16 0825    Education provided Yes   Education Details HEP   Person(s) Educated Patient   Methods Explanation;Demonstration;Tactile cues;Verbal cues;Handout   Comprehension Verbalized understanding;Returned demonstration;Verbal cues required;Tactile cues required             PT Long Term Goals - 09/28/16 0852      PT LONG TERM GOAL #1   Title Improve tissue extensibility through bilat hips and lumbar spine with patient to demo improve moblity with testing and stretching 10/12/16   Time 6  Period Weeks   Status Partially Met     PT LONG TERM GOAL #2   Title Patient to report decreased pain with functional activities - including carrying 67 yr old daughter up the stairs to bed 10/12/16   Time 6   Period Weeks   Status Partially Met     PT LONG TERM GOAL #3   Title Patient has decreased tenderness and tightness to palpatiion through the hip flexors bilat 10/12/16   Time 6   Period Weeks   Status On-going     PT LONG TERM GOAL #4   Title Independent in HEP 10/12/16   Time 6   Period Weeks   Status On-going     PT LONG TERM GOAL #5   Title Improve FOTO to </=29% limitation 10/12/16   Time 6   Period Weeks   Status On-going               Plan - 10/02/16 7741    Clinical Impression Statement Continued improvement. tolerated DN last visit with minimal soreness. Less palpable tightness noted. Tight through the Lt  piriformis and Lt obliques. Progressing well toward goals - anticipate d/c in 1-2 weeks.    Rehab Potential Good   PT Frequency 2x / week   PT Duration 6 weeks   PT Treatment/Interventions Patient/family education;ADLs/Self Care Home Management;Neuromuscular re-education;Cryotherapy;Electrical Stimulation;Iontophoresis 37m/ml Dexamethasone;Moist Heat;Traction;Ultrasound;Dry needling;Manual techniques;Therapeutic activities;Therapeutic exercise   PT Next Visit Plan Continue progressive core/ lumbar stabilization.  Manual therapy to iliopsoas/ QL.  Assess response to TDN to opliques and piriformis      Patient will benefit from skilled therapeutic intervention in order to improve the following deficits and impairments:  Postural dysfunction, Improper body mechanics, Pain, Decreased range of motion, Decreased mobility, Increased fascial restricitons, Increased muscle spasms, Decreased activity tolerance  Visit Diagnosis: Acute bilateral low back pain without sciatica  Other symptoms and signs involving the musculoskeletal system     Problem List Patient Active Problem List   Diagnosis Date Noted  . Abdominal pain, epigastric 08/04/2016  . Microscopic hematuria 07/14/2016  . Lumbar degenerative disc disease 05/25/2016  . Varicose vein of leg 06/04/2015  . Patellofemoral pain syndrome 06/04/2015  . Baker's cyst of right knee 02/19/2015  . Overactive bladder 09/24/2014  . Depression 08/27/2014  . Myofascial pain syndrome 08/27/2014  . Family history of breast cancer 09/12/2013  . Well woman exam 07/28/2012  . Goiter, nontoxic, multinodular 10/19/2008    Horice Carrero PNilda SimmerPT, MPH  10/02/2016, 8:49 AM  [facility] [place] [address]Phone: [phone]  Fax:  [phone] Name: Dawn DENARDOMRN: [id number] of Birth: [birth year]

## 2016-10-02 NOTE — Patient Instructions (Addendum)
Side Leg Lift    Lie on side, back straight along edge of mat, legs 30 in front of torso. Flexing foot, lift top leg to 45 without hiking hip, tap toe. Bring leg back behind and tap  Lower leg, foot pointed. Repeat __10__ times. Repeat on other side. Do __1__ sessions per day.   Piriformis Stretch    Lying on back, pull right knee toward opposite shoulder. Hold _30___ seconds. Repeat _3___ times. Do _1-2___ sessions per day.

## 2016-10-05 ENCOUNTER — Other Ambulatory Visit: Payer: Self-pay | Admitting: Sports Medicine

## 2016-10-05 MED ORDER — PREGABALIN 75 MG PO CAPS: 75.0000 mg | ORAL_CAPSULE | Freq: Two times a day (BID) | ORAL | 3 refills | Status: DC

## 2016-10-05 MED ORDER — PREGABALIN 50 MG PO CAPS: 50.0000 mg | ORAL_CAPSULE | Freq: Every day | ORAL | 3 refills | Status: DC

## 2016-10-08 ENCOUNTER — Ambulatory Visit (INDEPENDENT_AMBULATORY_CARE_PROVIDER_SITE_OTHER): Payer: Federal, State, Local not specified - PPO | Admitting: Physical Therapy

## 2016-10-08 DIAGNOSIS — M545 Low back pain, unspecified: Secondary | ICD-10-CM

## 2016-10-08 DIAGNOSIS — R29898 Other symptoms and signs involving the musculoskeletal system: Secondary | ICD-10-CM

## 2016-10-08 NOTE — Therapy (Signed)
[place], [place], [address] Phone: [phone]   Fax:  [phone]  Physical Therapy Treatment  Patient Details  Name: Dawn [name] MRN: [id number] Date of Birth: [birth year] Referring Provider: Dr. Helane Rima  Encounter Date: 10/08/2016      PT End of Session - 10/08/16 0817    Visit Number 9   Number of Visits 12   Date for PT Re-Evaluation 10/12/16   PT Start Time 0803   PT Stop Time 0906   PT Time Calculation (min) 63 min      Past Medical History:  Diagnosis Date  . Hip pain   . Right tennis elbow   . Thyroid goiter    benign    Past Surgical History:  Procedure Laterality Date  . TONSILLECTOMY  1986    There were no vitals filed for this visit.      Subjective Assessment - 10/08/16 0807    Subjective Things are improving overall.  She slept wrong for a few nights, but has been doing her stretches and the excess discomfort has worked itself out.  She continues to have a "jab" with heel strike on LLE and single leg stance.    Currently in Pain? Yes   Pain Score 3    Pain Location Hip   Pain Orientation Left;Lateral;Anterior   Pain Descriptors / Indicators --  jab    Aggravating Factors  walking, driving   Pain Relieving Factors TENS             [facility] PT Assessment - 10/08/16 0001      Assessment   Medical Diagnosis Lumbar DDD    Referring Provider Dr. Helane Rima   Onset Date/Surgical Date 05/28/16   Hand Dominance Right         [facility] Adult PT Treatment/Exercise - 10/08/16 0001      Lumbar Exercises: Stretches   Hip Flexor Stretch 20 seconds;4 reps  lunge with arm over head    Hip Flexor Stretch Limitations then hanging LE off edge off bed x 2 reps each leg.    Piriformis Stretch 3 reps;30 seconds     Lumbar Exercises: Aerobic   Stationary Bike NuStep L5: 5 min      Lumbar Exercises: Sidelying   Hip Abduction 10 reps  plus 10 tap fwd/back touch  x 3 each time    Hip Abduction Limitations demo and tactile cues for improved form.      Lumbar Exercises: Prone   Opposite Arm/Leg Raise Right arm/Left leg;Left arm/Right leg;5 reps  2 sets   Other Prone Lumbar Exercises childs pose x 20 sec x 2 reps     Lumbar Exercises: Quadruped   Madcat/Old Horse 5 reps     Moist Heat Therapy   Number Minutes Moist Heat 20 Minutes   Moist Heat Location Lumbar Spine;Hip  Lt ant hip     Electrical Stimulation   Electrical Stimulation Location psoas; lateral side/transverse abdominals; lumbar spine Lt    Electrical Stimulation Action IFC   Electrical Stimulation Parameters to tolerance    Electrical Stimulation Goals Tone;Pain     Manual Therapy   Soft tissue mobilization TPR with oscillations of LLE to Lt / Rt QL and lumbar/thoracic paraspinals, and contract relax to Lt iliopsoas.            PT Long Term Goals - 10/08/16 0956      PT LONG TERM GOAL #1   Title Improve tissue extensibility through  bilat hips and lumbar spine with patient to demo improve moblity with testing and stretching 10/12/16   Time 6   Period Weeks   Status Partially Met     PT LONG TERM GOAL #2   Title Patient to report decreased pain with functional activities - including carrying 36 yr old daughter up the stairs to bed 10/12/16   Time 6   Period Weeks   Status Achieved     PT LONG TERM GOAL #3   Title Patient has decreased tenderness and tightness to palpatiion through the hip flexors bilat 10/12/16   Time 6   Period Weeks   Status On-going     PT LONG TERM GOAL #4   Title Independent in HEP 10/12/16   Time 6   Period Weeks   Status On-going     PT LONG TERM GOAL #5   Title Improve FOTO to </=29% limitation 10/12/16   Time 6   Period Weeks   Status On-going               Plan - 10/08/16 0849    Clinical Impression Statement Pt required some cues for improved form on new exercises for HEP (added last session). She continues to have point  tenderness in iliopsoas muscle on Lt with manual therapy.  She has now met LTG # 2 and is making gradual progress towards unmet goals.    Rehab Potential Good   PT Frequency 2x / week   PT Duration 6 weeks   PT Treatment/Interventions Patient/family education;ADLs/Self Care Home Management;Neuromuscular re-education;Cryotherapy;Electrical Stimulation;Iontophoresis 76m/ml Dexamethasone;Moist Heat;Traction;Ultrasound;Dry needling;Manual techniques;Therapeutic activities;Therapeutic exercise   PT Next Visit Plan Continue progressive core/ lumbar stabilization.  Manual therapy vs TDN to iliopsoas/ QL.     Consulted and Agree with Plan of Care Patient      Patient will benefit from skilled therapeutic intervention in order to improve the following deficits and impairments:  Postural dysfunction, Improper body mechanics, Pain, Decreased range of motion, Decreased mobility, Increased fascial restricitons, Increased muscle spasms, Decreased activity tolerance  Visit Diagnosis: Acute bilateral low back pain without sciatica  Other symptoms and signs involving the musculoskeletal system     Problem List Patient Active Problem List   Diagnosis Date Noted  . Abdominal pain, epigastric 08/04/2016  . Microscopic hematuria 07/14/2016  . Lumbar degenerative disc disease 05/25/2016  . Varicose vein of leg 06/04/2015  . Patellofemoral pain syndrome 06/04/2015  . Baker's cyst of right knee 02/19/2015  . Overactive bladder 09/24/2014  . Depression 08/27/2014  . Myofascial pain syndrome 08/27/2014  . Family history of breast cancer 09/12/2013  . Well woman exam 07/28/2012  . Goiter, nontoxic, multinodular 10/19/2008   JKerin Perna PTA 10/08/16 9:57 AM  [facility] [address]Phone: [phone]  Fax:  [phone] Name: Dawn JUNIOMRN: [id number] of Birth: 5May 07, 1982

## 2016-10-12 ENCOUNTER — Encounter: Payer: Self-pay | Admitting: Rehabilitative and Restorative Service Providers"

## 2016-10-12 ENCOUNTER — Ambulatory Visit (INDEPENDENT_AMBULATORY_CARE_PROVIDER_SITE_OTHER): Payer: Federal, State, Local not specified - PPO | Admitting: Rehabilitative and Restorative Service Providers"

## 2016-10-12 DIAGNOSIS — R29898 Other symptoms and signs involving the musculoskeletal system: Secondary | ICD-10-CM | POA: Diagnosis not present

## 2016-10-12 DIAGNOSIS — M545 Low back pain, unspecified: Secondary | ICD-10-CM

## 2016-10-12 NOTE — Therapy (Addendum)
[place], [place], [address] Phone: [phone]   Fax:  [phone]  Physical Therapy Treatment  Patient Details  Name: Dawn [name] MRN: [id number] Date of Birth: [birth year] Referring Provider: Dr Dianah Field  Encounter Date: 10/12/2016      PT End of Session - 10/12/16 0725    Visit Number 10   Number of Visits 12   Date for PT Re-Evaluation 10/12/16   PT Start Time 0718   PT Stop Time 0814   PT Time Calculation (min) 56 min   Activity Tolerance Patient tolerated treatment well      Past Medical History:  Diagnosis Date  . Hip pain   . Right tennis elbow   . Thyroid goiter    benign    Past Surgical History:  Procedure Laterality Date  . TONSILLECTOMY  1986    There were no vitals filed for this visit.      Subjective Assessment - 10/12/16 0727    Subjective Patient reports that she is doing well. She has been able to lift and carry her daughter without difficulty. Feels she is doing much better overall. Wishes to cancel appt Wednesday this week and will call to schedule additional appts if she feels she needs to return for further PT.    Currently in Pain? Yes   Pain Score 1    Pain Location Hip   Pain Orientation Left;Lateral;Anterior   Pain Descriptors / Indicators --  stretch pain             [facility] PT Assessment - 10/12/16 0001      Assessment   Medical Diagnosis Lumbar DDD    Referring Provider Dr Dianah Field   Onset Date/Surgical Date 05/28/16   Hand Dominance Right     Observation/Other Assessments   Focus on Therapeutic Outcomes (FOTO)  17% limitation     Sensation   Additional Comments WFL's      Posture/Postural Control   Posture Comments good improvement in posture and alignment      AROM   Lumbar Flexion 90%   Lumbar Extension 75%   Lumbar - Right Side Bend 85%   Lumbar - Left Side Bend 85%   Lumbar - Right Rotation 75%   Lumbar - Left  Rotation 75%     Strength   Overall Strength Comments 5/5 bilat LE's      Flexibility   Hamstrings 80-85 deg bilat    Quadriceps WFL   ITB WFL   Piriformis WFL     Palpation   Palpation comment minimal tightness iliopsoas; transverse abdominals; lats; hip abductors Rt > Lt ;                      [facility] Adult PT Treatment/Exercise - 10/12/16 0001      Lumbar Exercises: Stretches   Hip Flexor Stretch 20 seconds;4 reps  lunge with arm over head    Hip Flexor Stretch Limitations hanging LE off edge off bed x 2 reps each leg.    Pelvic Tilt Limitations lateral trunk flexioin to the Rt to stretch Lt side 30 sec x 3 over bolster and pillows    Quadruped Mid Back Stretch 3 reps;30 seconds     Lumbar Exercises: Aerobic   Stationary Bike NuStep L5: 5 min      Lumbar Exercises: Prone   Other Prone Lumbar Exercises childs pose x 20 sec x 2 reps     Lumbar  Exercises: Quadruped   Madcat/Old Horse 5 reps     Moist Heat Therapy   Number Minutes Moist Heat 20 Minutes   Moist Heat Location Lumbar Spine;Hip  Lt ant hip     Electrical Stimulation   Electrical Stimulation Location psoas; lateral side/transverse abdominals; lumbar spine Lt    Electrical Stimulation Action IFC   Electrical Stimulation Parameters  to tolerance   Electrical Stimulation Goals Tone;Pain     Manual Therapy   Manual Therapy Soft tissue mobilization;Myofascial release;Taping   Manual therapy comments pt supine hooklying and prone   Soft tissue mobilization working through the Rt iliopsoas; transverse abdominals; bilat QL/lumbar paraspinals; Rt piriformis/hip abductors   Myofascial Release lumbar spine/Rt lateral posterior trunk          Trigger Point Dry Needling - 10/12/16 1310    Consent Given? Yes   Muscles Treated Lower Body Piriformis;Gluteus maximus  TDN Rt as noted + QL/lumbar paraspinals/dec tightness   Gluteus Maximus Response Palpable increased muscle length   Piriformis Response  Palpable increased muscle length              PT Education - 10/12/16 1312    Education provided Yes   Education Details Reviewed TDN; HEP; encouraged consistent home program at D/c to avoid recurrent problems    Person(s) Educated Patient   Methods Explanation   Comprehension Verbalized understanding             PT Long Term Goals - 10/12/16 0730      PT LONG TERM GOAL #1   Title Improve tissue extensibility through bilat hips and lumbar spine with patient to demo improve moblity with testing and stretching 10/12/16   Time 6   Period Weeks   Status Achieved     PT LONG TERM GOAL #2   Title Patient to report decreased pain with functional activities - including carrying 7 yr old daughter up the stairs to bed 10/12/16   Time 6   Period Weeks   Status Achieved     PT LONG TERM GOAL #3   Title Patient has decreased tenderness and tightness to palpatiion through the hip flexors bilat 10/12/16   Time 6   Period Weeks   Status Achieved     PT LONG TERM GOAL #4   Title Independent in HEP 10/12/16   Time 6   Period Weeks   Status Achieved     PT LONG TERM GOAL #5   Title Improve FOTO to </=29% limitation 10/12/16   Time 6   Period Weeks   Status Achieved               Plan - 10/12/16 1313    Clinical Impression Statement Continued improvement reported and noted with treatment. Patient has minimal tightness and limitations through the Rt/Lt lumbar spine. she has minimal pain and normal strength. Nelda has returnted to all normal functional activities including carrying her daughter up and down the stairs at home. She is pleased with her progress and will continue with I HEP  and call if she feels she needs to schedule additional appts in the next 2-3 weeks.    Rehab Potential Good   PT Frequency 2x / week   PT Duration 6 weeks   PT Treatment/Interventions Patient/family education;ADLs/Self Care Home Management;Neuromuscular  re-education;Cryotherapy;Electrical Stimulation;Iontophoresis 105m/ml Dexamethasone;Moist Heat;Traction;Ultrasound;Dry needling;Manual techniques;Therapeutic activities;Therapeutic exercise   PT Next Visit Plan Hold PT - patient will call if she experiences any flare up in symptoms in the next  2-3 weeks.    Consulted and Agree with Plan of Care Patient      Patient will benefit from skilled therapeutic intervention in order to improve the following deficits and impairments:  Postural dysfunction, Improper body mechanics, Pain, Decreased range of motion, Decreased mobility, Increased fascial restricitons, Increased muscle spasms, Decreased activity tolerance  Visit Diagnosis: Acute bilateral low back pain without sciatica  Other symptoms and signs involving the musculoskeletal system     Problem List Patient Active Problem List   Diagnosis Date Noted  . Abdominal pain, epigastric 08/04/2016  . Microscopic hematuria 07/14/2016  . Lumbar degenerative disc disease 05/25/2016  . Varicose vein of leg 06/04/2015  . Patellofemoral pain syndrome 06/04/2015  . Baker's cyst of right knee 02/19/2015  . Overactive bladder 09/24/2014  . Depression 08/27/2014  . Myofascial pain syndrome 08/27/2014  . Family history of breast cancer 09/12/2013  . Well woman exam 07/28/2012  . Goiter, nontoxic, multinodular 10/19/2008    Tiffini Blacksher Nilda Simmer PT, MPH 10/12/2016, 1:21 PM  [facility] [place], [place], [address] Phone: [phone]   Fax:  [phone]  Name: REIZY DUNLOW MRN: [id number] Date of Birth: [birth year]   PHYSICAL THERAPY DISCHARGE SUMMARY  Visits from Start of Care: 10  Current functional level related to goals / functional outcomes: See last progress note for discharge status. Laney made very good progress during the course of treatment and was pleased with her progress at time of discharge.   Remaining  deficits: Intermittent mild muscular tightness.   Education / Equipment: HEP Plan: Patient agrees to discharge.  Patient goals were met. Patient is being discharged due to meeting the stated rehab goals.  ?????    Vignesh Willert P. Helene Kelp PT, MPH 11/17/16 8:25 AM

## 2016-10-13 ENCOUNTER — Ambulatory Visit (INDEPENDENT_AMBULATORY_CARE_PROVIDER_SITE_OTHER): Payer: Federal, State, Local not specified - PPO | Admitting: Sports Medicine

## 2016-10-13 ENCOUNTER — Encounter: Payer: Self-pay | Admitting: Sports Medicine

## 2016-10-13 DIAGNOSIS — R69 Illness, unspecified: Secondary | ICD-10-CM

## 2016-10-13 DIAGNOSIS — J111 Influenza due to unidentified influenza virus with other respiratory manifestations: Secondary | ICD-10-CM | POA: Insufficient documentation

## 2016-10-13 LAB — POCT RAPID STREP A (OFFICE): Rapid Strep A Screen: NEGATIVE

## 2016-10-13 LAB — POCT INFLUENZA A/B
Influenza A, POC: NEGATIVE
Influenza B, POC: NEGATIVE

## 2016-10-13 MED ORDER — OSELTAMIVIR PHOSPHATE 75 MG PO CAPS: 75.0000 mg | ORAL_CAPSULE | Freq: Two times a day (BID) | ORAL | 0 refills | Status: DC

## 2016-10-13 NOTE — Assessment & Plan Note (Signed)
Over-the-counter cold and flu medications. Tamiflu.

## 2016-10-13 NOTE — Progress Notes (Signed)
  Subjective:    CC: Feeling sick  HPI: This is a pleasant 35 year old female, she comes in with a one-day history of muscle aches, body aches, low-grade fevers, sore throat, mild cough minimally productive. No GI symptoms, no rashes. Symptoms are moderate, persistent.  Past medical history:  Negative.  See flowsheet/record as well for more information.  Surgical history: Negative.  See flowsheet/record as well for more information.  Family history: Negative.  See flowsheet/record as well for more information.  Social history: Negative.  See flowsheet/record as well for more information.  Allergies, and medications have been entered into the medical record, reviewed, and no changes needed.   Review of Systems: No fevers, chills, night sweats, weight loss, chest pain, or shortness of breath.   Objective:    General: Well Developed, well nourished, and in no acute distress.  Neuro: Alert and oriented x3, extra-ocular muscles intact, sensation grossly intact.  HEENT: Normocephalic, atraumatic, pupils equal round reactive to light, neck supple, no masses, no lymphadenopathy, thyroid nonpalpable. Oropharynx, nasopharynx, ear canals unremarkable Skin: Warm and dry, no rashes. Cardiac: Regular rate and rhythm, no murmurs rubs or gallops, no lower extremity edema.  Respiratory: Clear to auscultation bilaterally. Not using accessory muscles, speaking in full sentences.  Negative strep and rapid flu test  Impression and Recommendations:    Influenza-like illness Over-the-counter cold and flu medications. Tamiflu.

## 2016-10-13 NOTE — Addendum Note (Signed)
Addended by: Baird Kay on: 10/13/2016 11:48 AM   Modules accepted: Orders

## 2016-10-14 ENCOUNTER — Encounter: Payer: Federal, State, Local not specified - PPO | Admitting: Physical Therapy

## 2016-10-29 ENCOUNTER — Other Ambulatory Visit: Payer: Self-pay | Admitting: Sports Medicine

## 2016-11-01 ENCOUNTER — Encounter: Payer: Self-pay | Admitting: Sports Medicine

## 2016-11-04 MED ORDER — PREGABALIN 50 MG PO CAPS: 50.0000 mg | ORAL_CAPSULE | Freq: Two times a day (BID) | ORAL | 0 refills | Status: DC

## 2016-11-04 MED ORDER — PREGABALIN 75 MG PO CAPS: 75.0000 mg | ORAL_CAPSULE | Freq: Every day | ORAL | 0 refills | Status: DC

## 2016-11-09 NOTE — Telephone Encounter (Signed)
Please see if Rx for lyrica has been faxed or is at front awaiting patient pickup.

## 2016-11-11 DIAGNOSIS — K5900 Constipation, unspecified: Secondary | ICD-10-CM | POA: Diagnosis not present

## 2016-11-11 DIAGNOSIS — K219 Gastro-esophageal reflux disease without esophagitis: Secondary | ICD-10-CM | POA: Diagnosis not present

## 2016-11-11 DIAGNOSIS — K2900 Acute gastritis without bleeding: Secondary | ICD-10-CM | POA: Diagnosis not present

## 2016-11-24 ENCOUNTER — Encounter: Payer: Self-pay | Admitting: Sports Medicine

## 2016-11-24 MED ORDER — SERTRALINE HCL 25 MG PO TABS: 25.0000 mg | ORAL_TABLET | Freq: Every day | ORAL | 2 refills | Status: DC

## 2016-12-13 IMAGING — DX DG FOOT COMPLETE 3+V*L*
3 series · 3 of 3 positions shown · non-contrast
Comparison: None.

CLINICAL DATA: Blunt trauma to left foot 05/31/2016 with pain over
third and fourth metatarsals.

EXAM:
LEFT FOOT - COMPLETE 3+ VIEW

[foot ap]
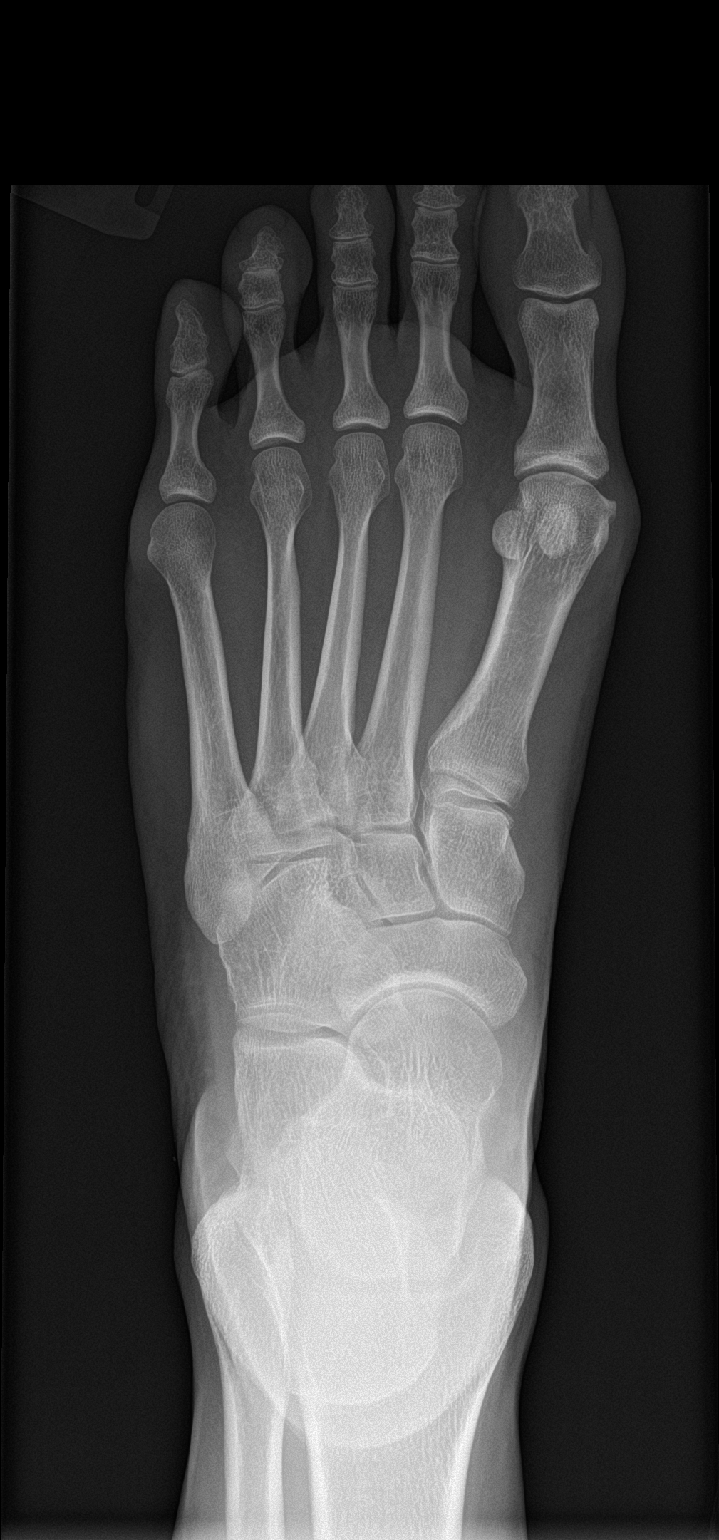

[foot obl]
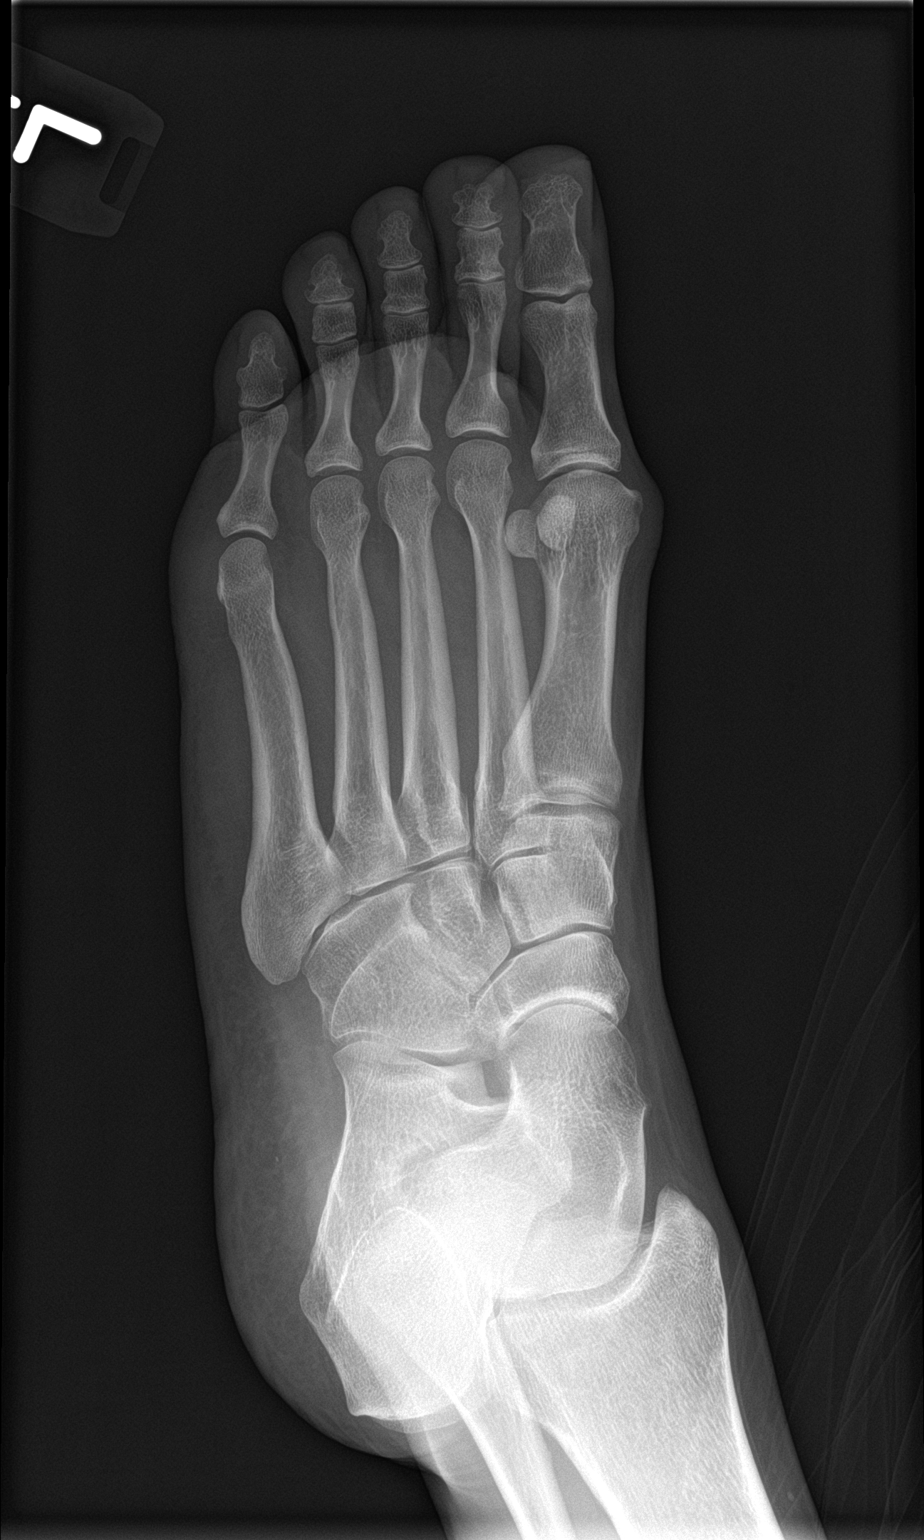

[foot lat]
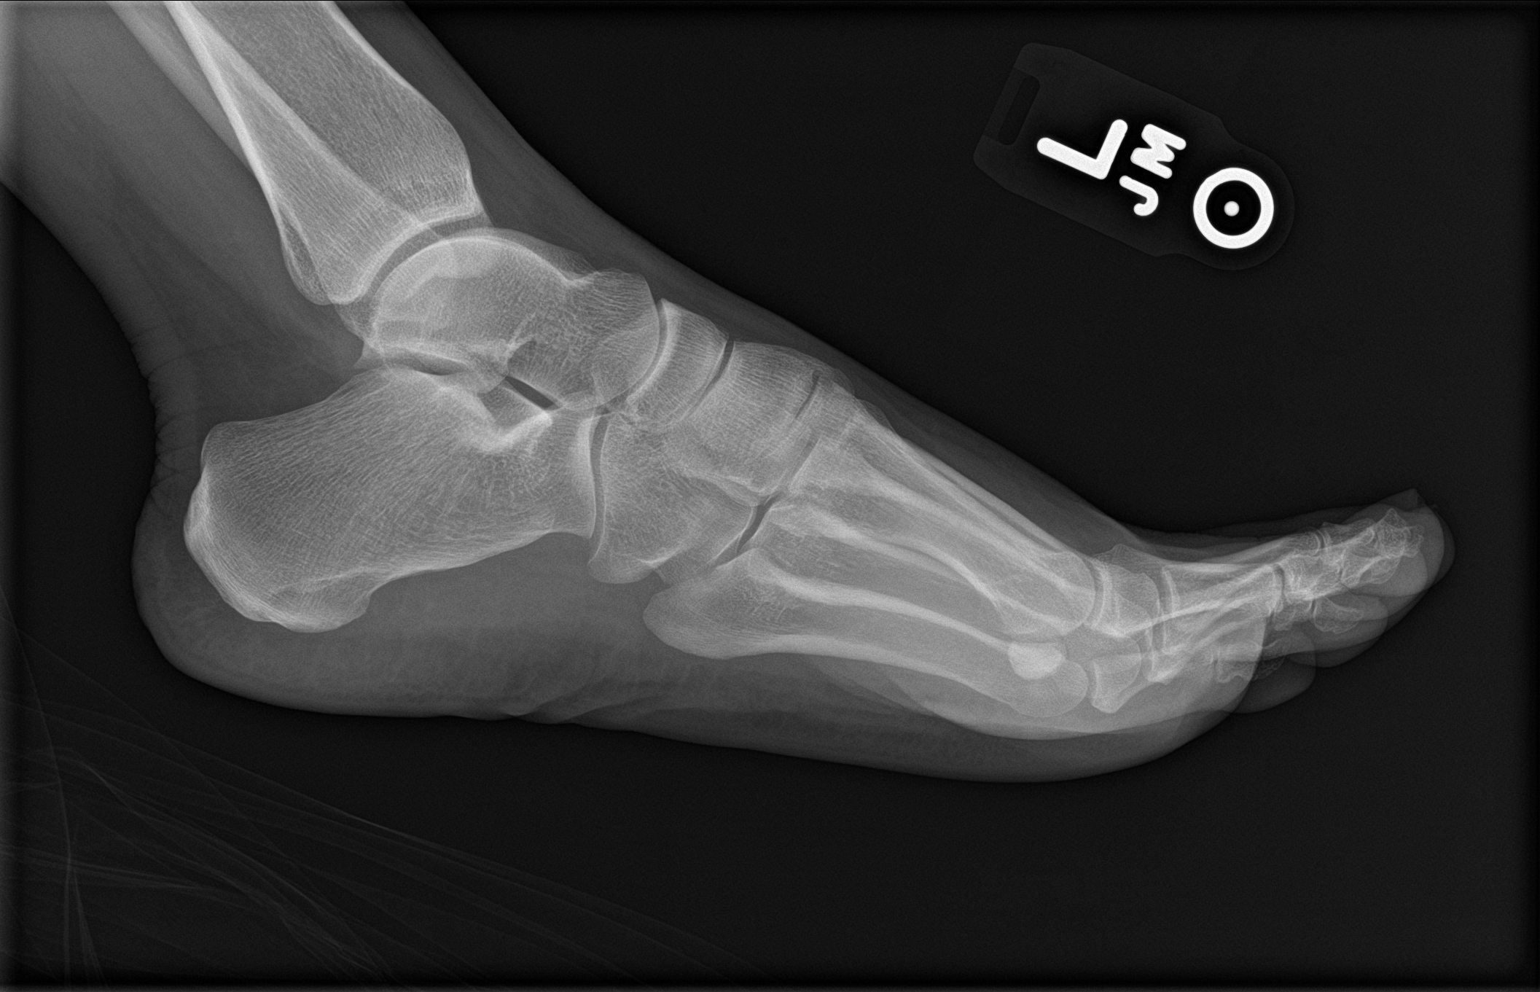

[3 of 3 positions shown; findings below may reference images not displayed]

FINDINGS: There is no evidence of fracture or dislocation. There is no
evidence of arthropathy or other focal bone abnormality. Soft
tissues are unremarkable.
IMPRESSION: Negative.

## 2016-12-13 IMAGING — DX DG LUMBAR SPINE COMPLETE 4+V
5 series · 5 of 5 positions shown · non-contrast
Comparison: CT 12/11/2015

CLINICAL DATA: Someone fell on patient 05/31/2016 with mid to low
back pain right worse than left.

EXAM:
LUMBAR SPINE - COMPLETE 4+ VIEW

[l-spine ap]
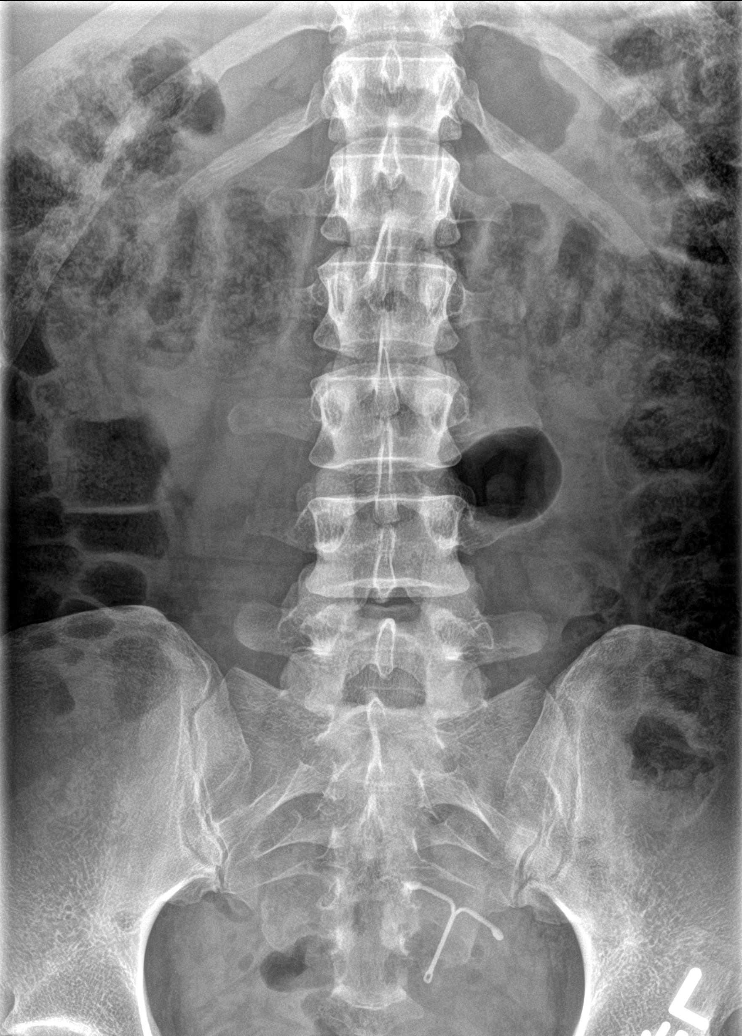

[l-spine obl (1 of 2)]
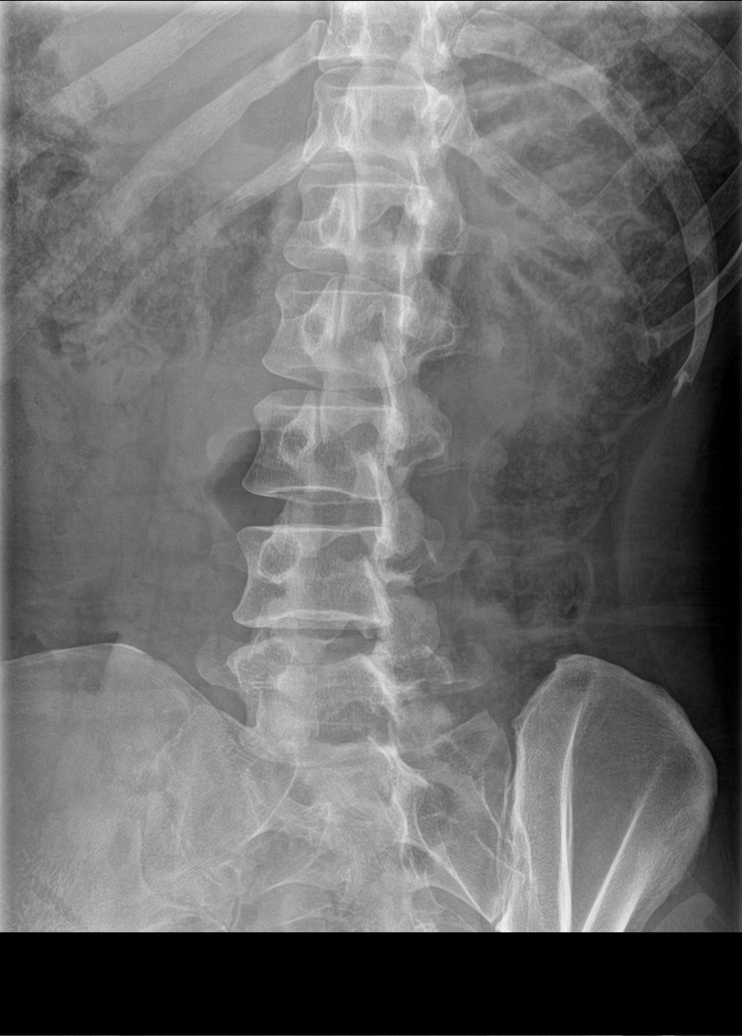

[l-spine obl (2 of 2)]
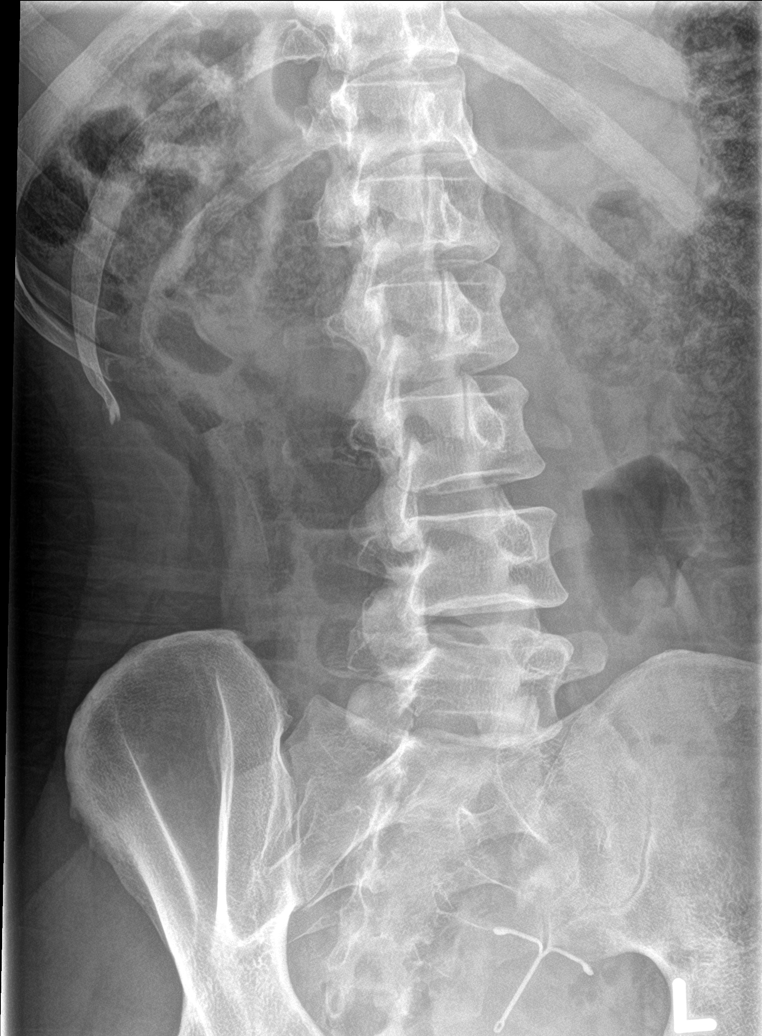

[l-spine lat]
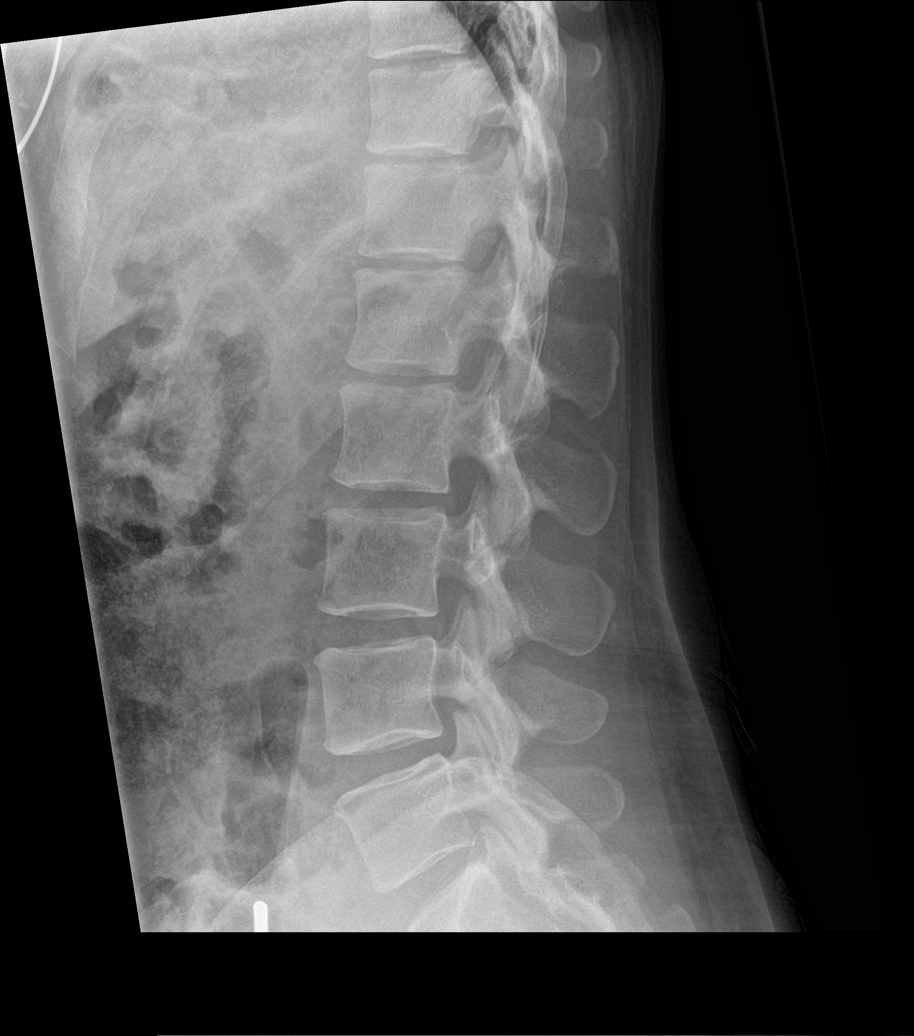

[l-spine spot]
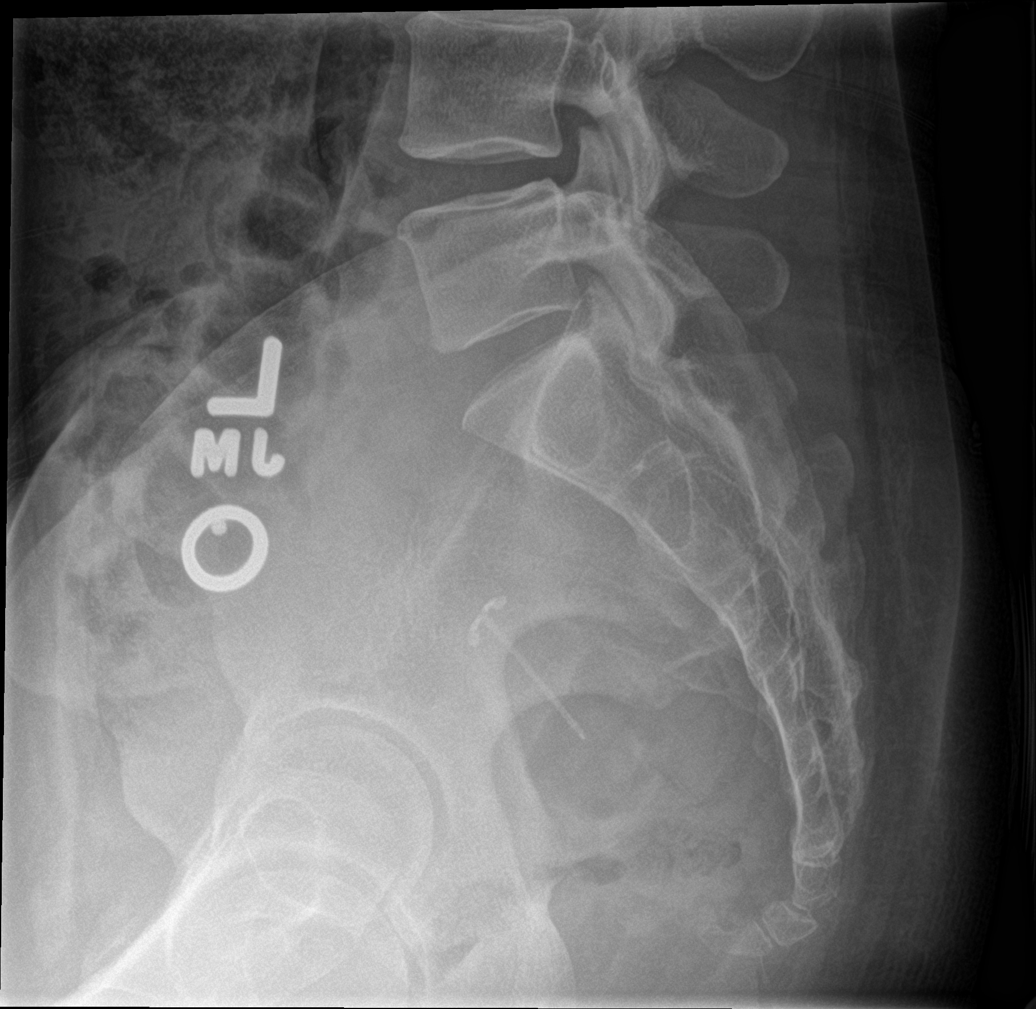

[5 of 5 positions shown; findings below may reference images not displayed]

FINDINGS: There is no evidence of lumbar spine fracture. Alignment is normal.
Intervertebral disc spaces are maintained. IUD present over the
pelvis just left of midline.
IMPRESSION: No acute findings.

## 2017-03-15 ENCOUNTER — Encounter: Payer: Self-pay | Admitting: Sports Medicine

## 2017-03-15 ENCOUNTER — Ambulatory Visit (INDEPENDENT_AMBULATORY_CARE_PROVIDER_SITE_OTHER): Payer: Federal, State, Local not specified - PPO | Admitting: Sports Medicine

## 2017-03-15 DIAGNOSIS — M7121 Synovial cyst of popliteal space [Baker], right knee: Secondary | ICD-10-CM

## 2017-03-15 DIAGNOSIS — E7800 Pure hypercholesterolemia, unspecified: Secondary | ICD-10-CM

## 2017-03-15 DIAGNOSIS — E785 Hyperlipidemia, unspecified: Secondary | ICD-10-CM

## 2017-03-15 HISTORY — DX: Hyperlipidemia, unspecified: E78.5

## 2017-03-15 NOTE — Assessment & Plan Note (Signed)
Checking lipids before considering a statin

## 2017-03-15 NOTE — Progress Notes (Signed)
In addition to the above assessment and plan, due to the locking of the knee there is concern for internal joint derangement. Steroid injection will likely address the issue, but we should keep an eye on it if pt presents for follow-up.

## 2017-03-15 NOTE — Assessment & Plan Note (Signed)
Aspiration and injection as above. 

## 2017-03-15 NOTE — Progress Notes (Signed)
  Subjective:    CC: fullness of right knee  HPI: Dawn [name] is a 36 y.o. Female with a history of right Baker's cyst presents with a nodule in the back of the right knee that she says has had increased fullness and pressure over the last week or so. She says the increased pressure has caused her knee to sometimes lock, but it improves with change of position.   Past medical history: Baker's cyst.  See flowsheet/record as well for more information.  Surgical history: Negative.  See flowsheet/record as well for more information.  Family history: Negative.  See flowsheet/record as well for more information.  Social history: Negative.  See flowsheet/record as well for more information.  Allergies, and medications have been entered into the medical record, reviewed, and no changes needed.   Review of Systems: No fevers, chills, night sweats, weight loss, chest pain, or shortness of breath.   Objective:    General: Well Developed, well nourished, and in no acute distress.  Neuro: Alert and oriented x3, extra-ocular muscles intact, sensation grossly intact.  HEENT: Normocephalic, atraumatic, pupils equal round reactive to light, neck supple, no masses, no lymphadenopathy, thyroid nonpalpable.  Skin: Warm and dry, no rashes. Cardiac: Regular rate and rhythm, no murmurs rubs or gallops, no lower extremity edema.  Respiratory: Clear to auscultation bilaterally. Not using accessory muscles, speaking in full sentences. Musculoskeletal: Right knee: palpable effusion around right popliteal region, cystic nodule located medially between semimembranosus and gastrocnemius muscles most prominent on full extension of the leg and that disappears on flexion to 45 degrees   Impression and Recommendations:    Dawn [name] is a 36 y.o. Female with a history of right Baker's cyst who presents today with recurrence. Cyst was drained of 0.5-1 mL of fluid and was injected with steroid. Bc of reported locking of the  knee, there is concern

## 2017-03-17 DIAGNOSIS — E7800 Pure hypercholesterolemia, unspecified: Secondary | ICD-10-CM | POA: Diagnosis not present

## 2017-03-18 LAB — CBC
HCT: 41.2 % (ref 35.0–45.0)
Hemoglobin: 13.5 g/dL (ref 11.7–15.5)
MCH: 29 pg (ref 27.0–33.0)
MCHC: 32.8 g/dL (ref 32.0–36.0)
MCV: 88.6 fL (ref 80.0–100.0)
MPV: 10.4 fL (ref 7.5–12.5)
Platelets: 213 10*3/uL (ref 140–400)
RBC: 4.65 MIL/uL (ref 3.80–5.10)
RDW: 14 % (ref 11.0–15.0)
WBC: 6.6 10*3/uL (ref 3.8–10.8)

## 2017-03-18 LAB — COMPREHENSIVE METABOLIC PANEL
AST: 17 U/L (ref 10–30)
CO2: 25 mmol/L (ref 20–31)
Calcium: 9.3 mg/dL (ref 8.6–10.2)
Chloride: 105 mmol/L (ref 98–110)
Glucose, Bld: 88 mg/dL (ref 65–99)
Potassium: 4.1 mmol/L (ref 3.5–5.3)
Sodium: 138 mmol/L (ref 135–146)
Total Bilirubin: 0.6 mg/dL (ref 0.2–1.2)

## 2017-03-18 LAB — LIPID PANEL W/REFLEX DIRECT LDL
Cholesterol: 221 mg/dL — ABNORMAL HIGH (ref <200)
HDL: 71 mg/dL (ref >50)
LDL-Cholesterol: 131 mg/dL — ABNORMAL HIGH
Non-HDL Cholesterol (Calc): 150 mg/dL — ABNORMAL HIGH (ref <130)
Total CHOL/HDL Ratio: 3.1 Ratio (ref <5.0)
Triglycerides: 90 mg/dL (ref <150)

## 2017-03-18 LAB — COMPREHENSIVE METABOLIC PANEL WITH GFR
ALT: 6 U/L (ref 6–29)
Albumin: 4.3 g/dL (ref 3.6–5.1)
Alkaline Phosphatase: 40 U/L (ref 33–115)
BUN: 9 mg/dL (ref 7–25)
Creat: 0.64 mg/dL (ref 0.50–1.10)
Total Protein: 6.8 g/dL (ref 6.1–8.1)

## 2017-04-10 ENCOUNTER — Encounter: Payer: Self-pay | Admitting: Emergency Medicine

## 2017-04-10 ENCOUNTER — Emergency Department
Admission: EM | Admit: 2017-04-10 | Discharge: 2017-04-10 | Disposition: A | Discharge status: Home / Self Care | Payer: Federal, State, Local not specified - PPO | Source: Home / Self Care | Attending: Family Medicine | Admitting: Family Medicine

## 2017-04-10 DIAGNOSIS — J029 Acute pharyngitis, unspecified: Secondary | ICD-10-CM | POA: Diagnosis not present

## 2017-04-10 LAB — POCT RAPID STREP A (OFFICE): Rapid Strep A Screen: NEGATIVE

## 2017-04-10 NOTE — ED Provider Notes (Signed)
CSN: [id number]     Arrival date & time 04/10/17  1209 History   First MD Initiated Contact with Patient 04/10/17 1242     Chief Complaint  Patient presents with  . Sore Throat   (Consider location/radiation/quality/duration/timing/severity/associated sxs/prior Treatment) HPI  Dawn [name] is a 36 y.o. female presenting to UC with c/o 2-3 days of sore throat with mild headache.  Denies fever, chills, n/v/d. Denies cough. No specific known sick contacts but she notes she [facility] and is unsure if she got sick then.  She did take ibuprofen this morning at 8AM for a headache but only mild relief. Throat pain is 3/10 at this time.    Past Medical History:  Diagnosis Date  . Hip pain   . Right tennis elbow   . Thyroid goiter    benign   Past Surgical History:  Procedure Laterality Date  . TONSILLECTOMY  1986   Family History  Problem Relation Age of Onset  . Hyperlipidemia Father   . Birth defects Maternal Grandmother   . Cancer Maternal Grandmother     Ovarian  . Birth defects Maternal Grandfather   . Cancer Maternal Grandfather   . Breast cancer Maternal Aunt 42  . Breast cancer Maternal Aunt 50  . Hypertension Sister   . Breast cancer Sister    Social History  Substance Use Topics  . Smoking status: Never Smoker  . Smokeless tobacco: Never Used  . Alcohol use 0.0 oz/week     Comment: 1-2 weekly   OB History    Gravida Para Term Preterm AB Living   1 1 1     1    SAB TAB Ectopic Multiple Live Births           1     Review of Systems  Constitutional: Negative for chills and fever.  HENT: Positive for sore throat. Negative for congestion, ear pain, trouble swallowing and voice change.   Respiratory: Negative for cough and shortness of breath.   Cardiovascular: Negative for chest pain and palpitations.  Gastrointestinal: Negative for abdominal pain, diarrhea, nausea and vomiting.  Musculoskeletal: Negative for arthralgias, back pain  and myalgias.  Skin: Negative for rash.  Neurological: Positive for headaches. Negative for dizziness and light-headedness.    Allergies  Latex  Home Medications   Prior to Admission medications   Not on File   Meds Ordered and Administered this Visit  Medications - No data to display  BP 108/70 (BP Location: Left Arm)   Pulse 83   Temp 97.7 F (36.5 C) (Oral)   Resp 16   Ht 5\' 5"  (1.651 m)   Wt 135 lb (61.2 kg)   SpO2 100%   BMI 22.47 kg/m  No data found.   Physical Exam  Constitutional: She is oriented to person, place, and time. She appears well-developed and well-nourished. No distress.  HENT:  Head: Normocephalic and atraumatic.  Right Ear: Tympanic membrane normal.  Left Ear: Tympanic membrane normal.  Nose: Nose normal.  Mouth/Throat: Uvula is midline and mucous membranes are normal. Posterior oropharyngeal erythema present. No oropharyngeal exudate, posterior oropharyngeal edema or tonsillar abscesses.  Eyes: EOM are normal.  Neck: Normal range of motion. Neck supple.  Cardiovascular: Normal rate and regular rhythm.   Pulmonary/Chest: Effort normal and breath sounds normal. No stridor. No respiratory distress. She has no wheezes. She has no rales.  Musculoskeletal: Normal range of motion.  Lymphadenopathy:    She has  no cervical adenopathy.  Neurological: She is alert and oriented to person, place, and time.  Skin: Skin is warm and dry. She is not diaphoretic.  Psychiatric: She has a normal mood and affect. Her behavior is normal.  Nursing note and vitals reviewed.   Urgent Care Course     Procedures (including critical care time)  Labs Review Labs Reviewed  STREP A DNA PROBE  POCT RAPID STREP A (OFFICE)    Imaging Review No results found.   MDM   1. Acute pharyngitis, unspecified etiology    Pt c/o sore throat for 2-3 days. No known sick contacts. Rapid strep: Negative  Culture sent  Encouraged fluids, rest, acetaminophen and  ibuprofen.    Junius Finner, PA-C 04/10/17 1249

## 2017-04-10 NOTE — ED Triage Notes (Signed)
Reports sore throat 2-3 days; no sense of fever; took ibuprofen at 0800.

## 2017-04-11 ENCOUNTER — Telehealth: Payer: Self-pay | Admitting: Emergency Medicine

## 2017-04-11 LAB — STREP A DNA PROBE: GASP: NOT DETECTED

## 2017-04-11 NOTE — Telephone Encounter (Signed)
Patient informed of Negative Results. Advised to call back with questions or concerns.  

## 2017-04-12 ENCOUNTER — Ambulatory Visit: Payer: Federal, State, Local not specified - PPO | Admitting: Sports Medicine

## 2017-05-11 ENCOUNTER — Encounter: Payer: Self-pay | Admitting: Sports Medicine

## 2017-05-11 ENCOUNTER — Ambulatory Visit (INDEPENDENT_AMBULATORY_CARE_PROVIDER_SITE_OTHER): Payer: Federal, State, Local not specified - PPO | Admitting: Sports Medicine

## 2017-05-11 ENCOUNTER — Ambulatory Visit (INDEPENDENT_AMBULATORY_CARE_PROVIDER_SITE_OTHER): Payer: Federal, State, Local not specified - PPO

## 2017-05-11 DIAGNOSIS — R42 Dizziness and giddiness: Secondary | ICD-10-CM | POA: Insufficient documentation

## 2017-05-11 DIAGNOSIS — H8110 Benign paroxysmal vertigo, unspecified ear: Secondary | ICD-10-CM

## 2017-05-11 DIAGNOSIS — H8113 Benign paroxysmal vertigo, bilateral: Secondary | ICD-10-CM

## 2017-05-11 DIAGNOSIS — M542 Cervicalgia: Secondary | ICD-10-CM

## 2017-05-11 HISTORY — DX: Benign paroxysmal vertigo, unspecified ear: H81.10

## 2017-05-11 MED ORDER — PREDNISONE 50 MG PO TABS: 50.0000 mg | ORAL_TABLET | Freq: Every day | ORAL | 0 refills | Status: DC

## 2017-05-11 MED ORDER — MECLIZINE HCL 25 MG PO TABS: 25.0000 mg | ORAL_TABLET | Freq: Three times a day (TID) | ORAL | 3 refills | Status: DC | PRN

## 2017-05-11 NOTE — Patient Instructions (Signed)

## 2017-05-11 NOTE — Progress Notes (Signed)
  Subjective:    CC: dizziness, headaches  HPI: Dawn [name] is a 36 y.o. Female with history of non-toxic multinodular goiter who present with a one-month history of dizziness and headaches. The dizziness occurs when she is driving and she moves her head too quickly, which causes her to feel a spinning sensation, to the point that she has had to pull over.   She has headaches throughout the day that are localized to the bilateral temples as well as maxillary sinus pressure. She also reports having headaches when she wakes up in the morning at the base of the posterior skull. She also complains of nausea along with the headaches, but denies vomiting or diarrhea. Pt denies fever, chills, night sweats, unexplained weight loss, confusion, or memory loss. Pt does complain of photophobia. Pt reports some heart racing, but denies SOB. She also notes that she had two periods last month, spaced a week apart that she thought was odd. She has never had any symptoms like this before.  Past medical history:  Negative.  See flowsheet/record as well for more information.  Surgical history: Negative.  See flowsheet/record as well for more information.  Family history: Negative.  See flowsheet/record as well for more information.  Social history: Negative.  See flowsheet/record as well for more information.  Allergies, and medications have been entered into the medical record, reviewed, and no changes needed.   Review of Systems: No fevers, chills, night sweats, weight loss, chest pain, or shortness of breath.   Objective:    General: Well Developed, well nourished, and in no acute distress.  Neuro: Alert and oriented x3, extra-ocular muscles intact, sensation grossly intact. Strength grossly intact. No cerebellar dysfunction. Cranial nerve exam normal.  HEENT: Normocephalic, atraumatic, pupils equal round reactive to light, neck supple, no masses, no lymphadenopathy.  Skin: Warm and dry, no rashes. Cardiac:  Regular rate and rhythm, no murmurs rubs or gallops, no lower extremity edema.  Respiratory: Clear to auscultation bilaterally. Not using accessory muscles, speaking in full sentences.   Impression and Recommendations:    Dawn [name] is a 36 y.o. Female who presents with suspected benign paroxysmal positional vertigo.   History is consistent with BPPV, but given photophobia and mild posterior headache, vestibular migraines are certainly on the differential.   Brain MRI from 2015 was negative with the exception of an odd T2 hyperintensity in a single location, which makes increased ICP, mass effect, acoustic neuromas less likely. Lack of focal neural deficits on exam is also reassuring.  Prescribed prednisone, meclizine. Pt referred for vestibular rehab. Cervical spine X-rays ordered.  If insufficient relief after 1-2 months of vestibular rehab, we will order repeat brain MRI with thin slices through the brainstem and cranial nerve VIII.  We will discuss metrorrhagia at the next visit.

## 2017-05-11 NOTE — Assessment & Plan Note (Signed)
Classic BPPV symptoms, certainly vestibular migraines are in the differential considering photophobia and mild posterior headache. Brain MRI from 2015 was negative with the exception of an odd T2 hyperintensity in a single location. Adding prednisone, meclizine, vestibular rehabilitation, cervical spine x-rays. If insufficient relief after 1-2 months of vestibular rehabilitation we will order a repeat brain MRI with thin slices through the brainstem and cranial nerve VIII.

## 2017-05-26 ENCOUNTER — Ambulatory Visit: Payer: Federal, State, Local not specified - PPO | Attending: Sports Medicine

## 2017-05-26 DIAGNOSIS — R42 Dizziness and giddiness: Secondary | ICD-10-CM | POA: Diagnosis not present

## 2017-05-26 DIAGNOSIS — R2689 Other abnormalities of gait and mobility: Secondary | ICD-10-CM | POA: Diagnosis present

## 2017-05-26 NOTE — Patient Instructions (Signed)
Gaze Stabilization: Tip Card  1.Target must remain in focus, not blurry, and appear stationary while head is in motion. 2.Perform exercises with small head movements (45 to either side of midline). 3.Increase speed of head motion so long as target is in focus. 4.If you wear eyeglasses, be sure you can see target through lens (therapist will give specific instructions for bifocal / progressive lenses). 5.These exercises may provoke dizziness or nausea. Work through these symptoms. If too dizzy, slow head movement slightly. Rest between each exercise. 6.Exercises demand concentration; avoid distractions. 7.For safety, perform standing exercises close to a counter, wall, corner, or next to someone.  Copyright  VHI. All rights reserved.    Gaze Stabilization: Sitting    Keeping eyes on target on wall 3-5 feet away, tilt head down 15-30 and move head side to side for __20-30__ seconds. Repeat while moving head up and down for __20-30__ seconds. Do __3__ sessions per day.  Copyright  VHI. All rights reserved.   

## 2017-05-26 NOTE — Therapy (Signed)
[facility] [address] [place], [place], [address] Phone: [phone]   Fax:  [phone]  Physical Therapy Evaluation  Patient Details  Name: Dawn [name] MRN: [id number] Date of Birth: 08-31-1981 Referring Provider: Dr. Benjamin Stain  Encounter Date: 05/26/2017      PT End of Session - 05/26/17 1504    Visit Number 1   Number of Visits 9   Date for PT Re-Evaluation 06/25/17   Authorization Type BCBS federal: 50 visit combined limit, no auth required   PT Start Time 0936   PT Stop Time 1021   PT Time Calculation (min) 45 min   Equipment Utilized During Treatment --  S prn   Activity Tolerance Patient tolerated treatment well   Behavior During Therapy [facility] for tasks assessed/performed      Past Medical History:  Diagnosis Date  . Hip pain   . Right tennis elbow   . Thyroid goiter    benign    Past Surgical History:  Procedure Laterality Date  . TONSILLECTOMY  1986    There were no vitals filed for this visit.       Subjective Assessment - 05/26/17 0940    Subjective Pt reported severe dizziness began approx. 1 month ago, especially while driving. Pt denied traumatic onset or illness prior to dizziness. Pt had to pull over during first episode of dizziness, as intensity was 8-9/10. Pt now describes dizziness as intermittent spinning and wooziness. Pt reports meclizine has been helping to decr. dizziness. Pt took meclizine this morning. Pt reports dizziness now at worst: 5-6/10 (initially 8-9/10) and at best: 1-2/10. Pt states any sudden head movement incr. dizziness, and decr. if she can focus. Pt reports HA began with dizziness and have decr. with use of meclizine. Pt denied recent infection or illness, falls, N/T, or weakness. Pt reported she has "floaters" in vision but recently went to eye doctor and was cleared.    Pertinent History Goiter, Lx spine DDD   Patient Stated Goals To feel safer when  driving and stop the medication, walk in a safe manner.    Currently in Pain? No/denies            [facility] PT Assessment - 05/26/17 0945      Assessment   Medical Diagnosis BPPV due to B vestibular disorder   Referring Provider Dr. Benjamin Stain   Onset Date/Surgical Date 04/25/17   Hand Dominance Right   Prior Therapy none     Precautions   Precautions Fall     Restrictions   Weight Bearing Restrictions No     Balance Screen   Has the patient fallen in the past 6 months No   Has the patient had a decrease in activity level because of a fear of falling?  Yes   Is the patient reluctant to leave their home because of a fear of falling?  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, play with dtr     Cognition   Overall Cognitive Status Within Functional Limits for tasks assessed  reported concentration has been challenging when dizzy     Sensation   Additional Comments No N/T reported.      Functional Gait  Assessment   Gait assessed  Yes   Gait Level Surface Walks 20 ft in less than 5.5 sec, no assistive devices, good speed, no evidence for imbalance, normal gait pattern, deviates no more than 6 in outside of the 12 in walkway width.  5.9 sec.   Change in Gait Speed Able to smoothly change walking speed without loss of balance or  gait deviation. Deviate no more than 6 in outside of the 12 in walkway width.   Gait with Horizontal Head Turns Performs head turns smoothly with no change in gait. Deviates no more than 6 in outside 12 in walkway width   Gait with Vertical Head Turns Performs task with slight change in gait velocity (eg, minor disruption to smooth gait path), deviates 6 - 10 in outside 12 in walkway width or uses assistive device   Gait and Pivot Turn Pivot turns safely within 3 sec and stops quickly with no loss of balance.   Step Over Obstacle Is able to step over 2 stacked shoe boxes taped together (9 in total height) without changing gait speed. No evidence of imbalance.   Gait with Narrow Base of Support Ambulates 7-9 steps.   Gait with Eyes Closed Walks 20 ft, uses assistive device, slower speed, mild gait deviations, deviates 6-10 in outside 12 in walkway width. Ambulates 20 ft in less than 9 sec but greater than 7 sec.   Ambulating Backwards Walks 20 ft, uses assistive device, slower speed, mild gait deviations, deviates 6-10 in outside 12 in walkway width.   Steps Alternating feet, no rail.   Total Score 26            Vestibular Assessment - 05/26/17 0949      Symptom Behavior   Type of Dizziness "Funny feeling in head"  with intermittent spinning   Frequency of Dizziness Daily at first and now several times a week since she started meclizine   Duration of Dizziness Minutes (spinning) and then wooziness (ongoing)   Aggravating Factors Turning head quickly;Lying supine;Driving  focusing   Relieving Factors Medication     Occulomotor Exam   Occulomotor Alignment Normal   Spontaneous Absent   Gaze-induced Absent   Smooth Pursuits Saccades  During R horizontal smooth pursuits   Saccades Intact   Comment B HIT: negative and no dizziness     Vestibulo-Occular Reflex   VOR 1 Head Only (x 1 viewing) Intact with pt reported 2/10 dizziness during VOR.    VOR Cancellation Normal     Visual  Acuity   Static 10   Dynamic 4     Positional Testing   Dix-Hallpike Dix-Hallpike Right;Dix-Hallpike Left   Horizontal Canal Testing Horizontal Canal Right;Horizontal Canal Left     Dix-Hallpike Right   Dix-Hallpike Right Duration none   Dix-Hallpike Right Symptoms No nystagmus     Dix-Hallpike Left   Dix-Hallpike Left Duration none   Dix-Hallpike Left Symptoms No nystagmus     Horizontal Canal Right   Horizontal Canal Right Duration none   Horizontal Canal Right Symptoms Normal  Horizontal Canal Left   Horizontal Canal Left Duration none   Horizontal Canal Left Symptoms Normal     Positional Sensitivities   Supine to Sitting Mild dizziness        Objective measurements completed on examination: See above findings.           Vestibular Treatment/Exercise - 05/26/17 1012      Vestibular Treatment/Exercise   Vestibular Treatment Provided Gaze   Gaze Exercises X1 Viewing Horizontal;X1 Viewing Vertical     X1 Viewing Horizontal   Foot Position seated   Time --  20-30 sec.   Reps 2   Comments Pt reported 3-4/10 dizziness. See HEP for details.      X1 Viewing Vertical   Foot Position seated    Time --  20-30 sec.   Reps 2   Comments Pt reported 2/10 dizziness. See HEP for details.                PT Education - 05/26/17 1503    Education provided Yes   Education Details PT discussed exam findings, PT POC, duration and frequency. PT provided pt with gaze stabilization HEP.    Person(s) Educated Patient   Methods Explanation;Demonstration;Verbal cues;Handout   Comprehension Returned demonstration;Verbalized understanding          PT Short Term Goals - 05/26/17 1514      PT SHORT TERM GOAL #1   Title same as LTGs.           PT Long Term Goals - 05/26/17 1514      PT LONG TERM GOAL #1   Title Pt will be IND in HEP to improve dizziness, balance, and gait deviations. TARGET DATE FOR ALL LTGS: 06/23/17   Status New     PT LONG TERM  GOAL #2   Title Pt will improve FGA score to 30/30 to decr. fall risk.    Status New     PT LONG TERM GOAL #3   Title Pt will report </=2/10 dizziness during amb. (1000') over even/uneven terrain, while performing head turns, in order to improve safety during functional mobility.    Status New     PT LONG TERM GOAL #4   Title Perform SOT and write goal as indicated.    Status New     PT LONG TERM GOAL #5   Title Retest positional testing once pt not taking Meclizine and write goal as indicated.    Status New     Additional Long Term Goals   Additional Long Term Goals Yes     PT LONG TERM GOAL #6   Title Pt will improve DHI score from 50%-32% to decr. impact dizziness has on functional abilities and to improve quality of life.    Status New                Plan - 05/26/17 1505    Clinical Impression Statement Pt is a pleasant 36 y/o female presenting to OPPT neuro for vertigo. Pt's PMH signficant for the following: Lumbar spine DDD and thyroid goiter. The following impairments were noted during pt exam: dizziness, imbalance, intermittent HA, and gait deviations. Positional testing was negative during PT exam. However, pt reported she took Meclizine this morning which could suppress signs/symptoms during vestsibular exam. Therefore, PT will reassess positional testing and occulomotor exam as indicated, when pt can tolerate taking Meclizine as needed (as prescribed by MD). Pt's FGA score indicates pt is at a low risk for falls. Pt did  experience incr. dizziness and unsteadiness during activities which require incr. vestibular input (i.e: head turns) and during VOR and DVA testing. Pt's DVA line difference was 6, which is significant. PT provided pt with gaze stabilization HEP. Pt likely has a vestibular hypofunction. However, pt did experience saccades during R sided (horizontal) smooth pursuits and vestibular migraines is a differential dx that MD is r/o. PT will trial therapy and  refer back to MD if no signs of improvement within 3 weeks. Pt would benefit from skilled PT to improve safety and dizziness during functional mobility.    History and Personal Factors relevant to plan of care: Pt works full time and care for 38y/o dtr   Clinical Presentation Stable   Clinical Decision Making Moderate   Rehab Potential Good   Clinical Impairments Affecting Rehab Potential MD r/o vestibular migraines, pt has hx of goiter and Lx spine DDD   PT Frequency 2x / week   PT Duration 4 weeks   PT Treatment/Interventions ADLs/Self Care Home Management;Biofeedback;Canalith Repostioning;Neuromuscular re-education;Balance training;Therapeutic exercise;Therapeutic activities;Functional mobility training;Stair training;Gait training;DME Instruction;Orthotic Fit/Training;Patient/family education;Vestibular   PT Next Visit Plan Perform SOT. Reassess positional testing for BPPV, if pt did not take meclizine. Review gaze stab. HEP and progress to standing as tolerated. Initiate balance HEP   PT Home Exercise Plan Gaze stabilization.   Consulted and Agree with Plan of Care Patient      Patient will benefit from skilled therapeutic intervention in order to improve the following deficits and impairments:  Abnormal gait, Dizziness, Decreased balance, Pain (PT will not directly treat HA pain but will monitor closely)  Visit Diagnosis: Dizziness and giddiness - Plan: PT plan of care cert/re-cert  Other abnormalities of gait and mobility - Plan: PT plan of care cert/re-cert     Problem List Patient Active Problem List   Diagnosis Date Noted  . Benign paroxysmal positional vertigo 05/11/2017  . Hyperlipidemia 03/15/2017  . Influenza-like illness 10/13/2016  . Abdominal pain, epigastric 08/04/2016  . Microscopic hematuria 07/14/2016  . Lumbar degenerative disc disease 05/25/2016  . Varicose vein of leg 06/04/2015  . Patellofemoral pain syndrome 06/04/2015  . Baker's cyst of right knee  02/19/2015  . Overactive bladder 09/24/2014  . Depression 08/27/2014  . Myofascial pain syndrome 08/27/2014  . Family history of breast cancer 09/12/2013  . Well woman exam 07/28/2012  . Goiter, nontoxic, multinodular 10/19/2008    Mariane Burpee L 05/26/2017, 3:18 PM  [facility] [address] [place], [place], [address] Phone: [phone]   Fax:  [phone]  Name: IYANLA EILERS MRN: [id number] Date of Birth: [birth year]  Zerita Boers, PT,DPT 05/26/17 3:19 PM Phone: [phone] Fax: [phone]

## 2017-06-04 ENCOUNTER — Encounter: Payer: Self-pay | Admitting: Physical Therapy

## 2017-06-04 ENCOUNTER — Ambulatory Visit: Payer: Federal, State, Local not specified - PPO | Admitting: Physical Therapy

## 2017-06-04 DIAGNOSIS — R42 Dizziness and giddiness: Secondary | ICD-10-CM | POA: Diagnosis not present

## 2017-06-04 DIAGNOSIS — R2689 Other abnormalities of gait and mobility: Secondary | ICD-10-CM

## 2017-06-04 NOTE — Patient Instructions (Addendum)
You Tube: Search Optokinetic Stimulation (driving on curvy road, busy grocery store, or moving lines)  Watch 2-3 minutes and increase time gradually-1-2 times a day.   Feet Partial Heel-Toe, Head Motion - Eyes Open    With eyes open, right foot partially in front of the other, move head slowly: up and down 10 times, side to side 10 times. Switch feet and Repeat. Do 1-2 sessions per day.   Feet Partial Heel-Toe, Head Motion - Eyes Closed   With eyes closed and right foot partially in front of the other.  Stand still for 10 seconds  Switch feet and Repeat. Do 1-2 sessions per day.

## 2017-06-04 NOTE — Therapy (Signed)
[facility] [address] [place], [place], [address] Phone: [phone]   Fax:  [phone]  Physical Therapy Treatment  Patient Details  Name: Dawn [name] MRN: [id number] Date of Birth: [birth year] Referring Provider: Dr. Benjamin Stain  Encounter Date: 06/04/2017      PT End of Session - 06/04/17 1627    Visit Number 2   Number of Visits 9   Date for PT Re-Evaluation 06/25/17   Authorization Type BCBS federal: 50 visit combined limit, no auth required   PT Start Time 1535   PT Stop Time 1617   PT Time Calculation (min) 42 min   Equipment Utilized During Treatment --  S prn   Activity Tolerance Patient tolerated treatment well   Behavior During Therapy [facility] for tasks assessed/performed      Past Medical History:  Diagnosis Date  . Hip pain   . Right tennis elbow   . Thyroid goiter    benign    Past Surgical History:  Procedure Laterality Date  . TONSILLECTOMY  1986    There were no vitals filed for this visit.      Subjective Assessment - 06/04/17 1537    Subjective Pt reports feeling better overall, has been off of the Antivert for a couple days and has not had any increases in dizziness; reports 1/10 dizziness after driving.   Pertinent History Goiter, Lx spine DDD   Patient Stated Goals To feel safer when driving and stop the medication, walk in a safe manner.    Currently in Pain? No/denies                Vestibular Assessment - 06/04/17 1538      Positional Testing   Dix-Hallpike Dix-Hallpike Right;Dix-Hallpike Left   Horizontal Canal Testing Horizontal Canal Right;Horizontal Canal Left     Dix-Hallpike Right   Dix-Hallpike Right Duration 0   Dix-Hallpike Right Symptoms No nystagmus     Dix-Hallpike Left   Dix-Hallpike Left Duration 0   Dix-Hallpike Left Symptoms No nystagmus     Horizontal Canal Right   Horizontal Canal Right Duration 0   Horizontal Canal Right Symptoms  Normal     Horizontal Canal Left   Horizontal Canal Left Duration 0   Horizontal Canal Left Symptoms Normal                  Vestibular Treatment/Exercise - 06/04/17 1600      Vestibular Treatment/Exercise   Vestibular Treatment Provided Gaze   Gaze Exercises X1 Viewing Horizontal;X1 Viewing Vertical     X1 Viewing Horizontal   Foot Position seated   Reps 2   Comments cues for slower speed to keep letter in focus and decrease symptoms     X1 Viewing Vertical   Foot Position seated   Reps 1   Comments 1-2/10             Balance Exercises - 06/04/17 1614      Balance Exercises: Standing   Standing Eyes Opened Narrow base of support (BOS);Head turns;Solid surface  partial tandem, head turns and nods x 10   Standing Eyes Closed Narrow base of support (BOS);Solid surface  partial tandem, hold 10 seconds each side           PT Education - 06/04/17 1626    Education provided Yes   Education Details vestibular migraines, reviewed x 1 viewing and added corner balance to HEP   Person(s) Educated Patient   Methods Explanation;Demonstration;Handout  Comprehension Verbalized understanding;Returned demonstration          PT Short Term Goals - 05/26/17 1514      PT SHORT TERM GOAL #1   Title same as LTGs.           PT Long Term Goals - 06/04/17 1632      PT LONG TERM GOAL #1   Title Pt will be IND in HEP to improve dizziness, balance, and gait deviations. TARGET DATE FOR ALL LTGS: 06/23/17   Status New     PT LONG TERM GOAL #2   Title Pt will improve FGA score to 30/30 to decr. fall risk.    Status New     PT LONG TERM GOAL #3   Title Pt will report </=2/10 dizziness during amb. (1000') over even/uneven terrain, while performing head turns, in order to improve safety during functional mobility.    Status New     PT LONG TERM GOAL #4   Title Perform SOT and write goal as indicated.    Status New     PT LONG TERM GOAL #5   Title Retest  positional testing once pt not taking Meclizine and write goal as indicated.    Baseline No formal goal needed; all canals clear with patient off of Meclizine   Status Achieved     PT LONG TERM GOAL #6   Title Pt will improve DHI score from 50%-32% to decr. impact dizziness has on functional abilities and to improve quality of life.    Status New               Plan - 06/04/17 1627    Clinical Impression Statement Pt is showing improvement from eval and reports less episodes of dizziness and decreased severity of dizziness even with coming off of the Antivert.  Performed re-assessment of canals with pt off of Antivert with no nystagmus or vertigo noted.  All canals clear.  Pt does report with episodes of dizziness-sensitivity to light, difficulty focusing, and fatigue.  Pt signs and symptoms most consistent with a form of vestibular migraine.  Provided pt with handout.  Continued session with review of x 1 viewing and added corner balance exercises to HEP.  Also discussed pt watching optokinetic stimulation videos each day for habituation.  Will continue to address and progress as pt tolerates.     Rehab Potential Good   Clinical Impairments Affecting Rehab Potential MD r/o vestibular migraines, pt has hx of goiter and Lx spine DDD   PT Frequency 2x / week   PT Duration 4 weeks   PT Treatment/Interventions ADLs/Self Care Home Management;Biofeedback;Canalith Repostioning;Neuromuscular re-education;Balance training;Therapeutic exercise;Therapeutic activities;Functional mobility training;Stair training;Gait training;DME Instruction;Orthotic Fit/Training;Patient/family education;Vestibular   PT Next Visit Plan Perform SOT. Review gaze stab. HEP and progress to standing as tolerated. Progress balance HEP   Consulted and Agree with Plan of Care Patient      Patient will benefit from skilled therapeutic intervention in order to improve the following deficits and impairments:  Abnormal gait,  Dizziness, Decreased balance, Pain  Visit Diagnosis: Dizziness and giddiness  Other abnormalities of gait and mobility     Problem List Patient Active Problem List   Diagnosis Date Noted  . Benign paroxysmal positional vertigo 05/11/2017  . Hyperlipidemia 03/15/2017  . Influenza-like illness 10/13/2016  . Abdominal pain, epigastric 08/04/2016  . Microscopic hematuria 07/14/2016  . Lumbar degenerative disc disease 05/25/2016  . Varicose vein of leg 06/04/2015  . Patellofemoral pain syndrome 06/04/2015  .  Baker's cyst of right knee 02/19/2015  . Overactive bladder 09/24/2014  . Depression 08/27/2014  . Myofascial pain syndrome 08/27/2014  . Family history of breast cancer 09/12/2013  . Well woman exam 07/28/2012  . Goiter, nontoxic, multinodular 10/19/2008   Edman Circle, PT, DPT 06/04/17    4:34 PM    [facility] [address] [place], [place], [address] Phone: [phone]   Fax:  [phone]  Name: ALIZAY BRONKEMA MRN: [id number] Date of Birth: October 09, 1981

## 2017-06-08 ENCOUNTER — Ambulatory Visit: Payer: Federal, State, Local not specified - PPO | Admitting: Physical Therapy

## 2017-06-08 ENCOUNTER — Encounter: Payer: Self-pay | Admitting: Physical Therapy

## 2017-06-08 DIAGNOSIS — R2689 Other abnormalities of gait and mobility: Secondary | ICD-10-CM

## 2017-06-08 DIAGNOSIS — R42 Dizziness and giddiness: Secondary | ICD-10-CM

## 2017-06-08 NOTE — Patient Instructions (Signed)
Gaze Stabilization - Tip Card  1.Target must remain in focus, not blurry, and appear stationary while head is in motion. 2.Perform exercises with small head movements (45 to either side of midline). 3.Increase speed of head motion so long as target is in focus. 4.If you wear eyeglasses, be sure you can see target through lens (therapist will give specific instructions for bifocal / progressive lenses). 5.These exercises may provoke dizziness or nausea. Work through these symptoms. If too dizzy, slow head movement slightly. Rest between each exercise. 6.Exercises demand concentration; avoid distractions. 7.For safety, perform standing exercises close to a counter, wall, corner, or next to someone.  Copyright  VHI. All rights reserved.   Gaze Stabilization - Standing Feet Apart   Feet together standing on soft pillow or folded up blanket, keeping eyes on target on wall 3 feet away, tilt head down slightly and move head side to side for 45 seconds. Repeat while moving head up and down for 45 seconds. *Work up to tolerating 60 seconds, as able. Do 2-3 sessions per day.  Return to sitting if you have a headache or a lot of dizziness.

## 2017-06-08 NOTE — Therapy (Signed)
[facility] [address] [place], [place], [address] Phone: [phone]   Fax:  [phone]  Physical Therapy Treatment  Patient Details  Name: Dawn [name] MRN: [id number] Date of Birth: 23-Jan-1981 Referring Provider: Dr. Benjamin Stain  Encounter Date: 06/08/2017      PT End of Session - 06/08/17 0939    Visit Number 3   Number of Visits 9   Date for PT Re-Evaluation 06/25/17   Authorization Type BCBS federal: 50 visit combined limit, no auth required   PT Start Time 0800   PT Stop Time 0846   PT Time Calculation (min) 46 min   Equipment Utilized During Treatment --  S prn   Activity Tolerance Patient tolerated treatment well   Behavior During Therapy [facility] for tasks assessed/performed      Past Medical History:  Diagnosis Date  . Hip pain   . Right tennis elbow   . Thyroid goiter    benign    Past Surgical History:  Procedure Laterality Date  . TONSILLECTOMY  1986    There were no vitals filed for this visit.      Subjective Assessment - 06/08/17 0803    Subjective Woke up with a nagging headache today; slight dizziness and sensitivity to light.  Watched a lot of optokinetic videos; x 1 viewing is going better.     Pertinent History Goiter, Lx spine DDD   Patient Stated Goals To feel safer when driving and stop the medication, walk in a safe manner.    Currently in Pain? Yes   Pain Score 3    Pain Location Head   Pain Orientation Anterior   Pain Descriptors / Indicators Nagging;Throbbing   Pain Type Acute pain   Pain Onset Today   Aggravating Factors  sensitive to light                Vestibular Assessment - 06/08/17 0806      Balancemaster   Balancemaster Sensory organization test   Balancemaster Comment Composite score of 70-WFL for age adjusted norms, WNL visual and somatosensory systems, vestibular system below average: 30      Conditions: 1: 3 trials WFL 2: 3 trials  WFL 3:  3 trials WFL 4: 3 trials WFL 5: 1 fall, second repetition below average, third trial [facility] 6: WFL Composite score: 70 Sensory Analysis Som: WNL Vis: WNL Vest: Below average: 30 Pref: WNL Strategy analysis: 1 fall, mildly impaired use of hip/ankle strategy during conditions 5 and 6 COG alignment: Pt with COG posterior and slightly to the L              Vestibular Treatment/Exercise - 06/08/17 0830      Vestibular Treatment/Exercise   Vestibular Treatment Provided Gaze   Gaze Exercises X1 Viewing Horizontal;X1 Viewing Vertical     X1 Viewing Horizontal   Foot Position standing feet apart, feet together solid surface, feet together compliant   Reps 3   Comments 45 seconds, mild symptoms     X1 Viewing Vertical   Foot Position standing feet apart, feet together solid surface, feet together compliant   Reps 3   Comments 45 seconds, mild symptoms            Balance Exercises - 06/08/17 0842      Balance Exercises: Standing   Tandem Gait Forward  x 25' with R/L up/down head turns every 3 steps-S           PT Education -  06/08/17 2956    Education provided Yes   Education Details clinical findings SOT, progressed x 1 viewing, tandem gait   Person(s) Educated Patient   Methods Explanation;Demonstration;Handout   Comprehension Verbalized understanding;Returned demonstration          PT Short Term Goals - 05/26/17 1514      PT SHORT TERM GOAL #1   Title same as LTGs.           PT Long Term Goals - 06/08/17 0943      PT LONG TERM GOAL #1   Title Pt will be IND in HEP to improve dizziness, balance, and gait deviations. TARGET DATE FOR ALL LTGS: 06/23/17   Status On-going     PT LONG TERM GOAL #2   Title Pt will improve FGA score to 30/30 to decr. fall risk.    Status On-going     PT LONG TERM GOAL #3   Title Pt will report </=2/10 dizziness during amb. (1000') over even/uneven terrain, while performing head turns, in order to improve  safety during functional mobility.    Status On-going     PT LONG TERM GOAL #4   Title Pt will maintain SOT composite score >70 with improvement in use of vestibular system to > or = to 50%   Baseline Vestibular: 30%   Status Revised     PT LONG TERM GOAL #5   Title Retest positional testing once pt not taking Meclizine and write goal as indicated.    Baseline No formal goal needed; all canals clear with patient off of Meclizine   Status Achieved     PT LONG TERM GOAL #6   Title Pt will improve DHI score from 50%-32% to decr. impact dizziness has on functional abilities and to improve quality of life.    Status On-going               Plan - 06/08/17 0939    Clinical Impression Statement Treatment session focused on continued assessment of balance systems with SOT; pt presents with below average use of vestibular systems and over reliance on visual and somatosensory systems.  Continued to progress gaze adaptation exercises and performed dynamic gait with narrow BOS adding in head turns.  Pt tolerated well with no increase in headache or dizziness.  Will continue to progress.   Rehab Potential Good   Clinical Impairments Affecting Rehab Potential MD r/o vestibular migraines, pt has hx of goiter and Lx spine DDD   PT Frequency 2x / week   PT Duration 4 weeks   PT Treatment/Interventions ADLs/Self Care Home Management;Biofeedback;Canalith Repostioning;Neuromuscular re-education;Balance training;Therapeutic exercise;Therapeutic activities;Functional mobility training;Stair training;Gait training;DME Instruction;Orthotic Fit/Training;Patient/family education;Vestibular   PT Next Visit Plan Progress balance HEP focusing on stimulation of vestibular system-change BOS, decrease visual input   PT Home Exercise Plan Gaze stabilization-standing on pillow, feet together work up to 60 seconds   Consulted and Agree with Plan of Care Patient      Patient will benefit from skilled therapeutic  intervention in order to improve the following deficits and impairments:  Abnormal gait, Dizziness, Decreased balance, Pain  Visit Diagnosis: Dizziness and giddiness  Other abnormalities of gait and mobility     Problem List Patient Active Problem List   Diagnosis Date Noted  . Benign paroxysmal positional vertigo 05/11/2017  . Hyperlipidemia 03/15/2017  . Influenza-like illness 10/13/2016  . Abdominal pain, epigastric 08/04/2016  . Microscopic hematuria 07/14/2016  . Lumbar degenerative disc disease 05/25/2016  . Varicose vein of  leg 06/04/2015  . Patellofemoral pain syndrome 06/04/2015  . Baker's cyst of right knee 02/19/2015  . Overactive bladder 09/24/2014  . Depression 08/27/2014  . Myofascial pain syndrome 08/27/2014  . Family history of breast cancer 09/12/2013  . Well woman exam 07/28/2012  . Goiter, nontoxic, multinodular 10/19/2008    Edman Circle, PT, DPT 06/08/17    9:47 AM    [facility] [place] [address] [place], [place], [address] Phone: [phone]   Fax:  [phone]  Name: SABRIN DUNLEVY MRN: [id number] Date of Birth: [birth year]

## 2017-06-11 ENCOUNTER — Encounter: Payer: Self-pay | Admitting: Physical Therapy

## 2017-06-11 ENCOUNTER — Ambulatory Visit: Payer: Federal, State, Local not specified - PPO | Admitting: Physical Therapy

## 2017-06-11 DIAGNOSIS — R42 Dizziness and giddiness: Secondary | ICD-10-CM | POA: Diagnosis not present

## 2017-06-11 DIAGNOSIS — R2689 Other abnormalities of gait and mobility: Secondary | ICD-10-CM

## 2017-06-11 NOTE — Patient Instructions (Signed)
LETTER EXERCISE: BUSY BACKGROUND (BLINDS); 60 SECONDS; FEET TOGETHER--WORK TOWARDS FEET PARTIAL HEEL-TOE   CORNER: STAND ON PILLOW, FEET STAGGERED.  EYES OPEN WITH HEAD TURNS, NODS, DIAGONALS.  EYES CLOSED-HOLD STILL FOR 10 SECONDS.  REPEAT WITH OTHER FOOT FORWARD

## 2017-06-11 NOTE — Therapy (Signed)
[facility] [address] [place], [place], [address] Phone: [phone]   Fax:  [phone]  Physical Therapy Treatment  Patient Details  Name: Dawn [name] MRN: [id number] Date of Birth: [birth year] Referring Provider: Dr. Benjamin Stain  Encounter Date: 06/11/2017      PT End of Session - 06/11/17 1016    Visit Number 4   Number of Visits 9   Date for PT Re-Evaluation 06/25/17   Authorization Type BCBS federal: 50 visit combined limit, no auth required   PT Start Time 0800   PT Stop Time 0848   PT Time Calculation (min) 48 min   Activity Tolerance Patient tolerated treatment well   Behavior During Therapy [facility] for tasks assessed/performed      Past Medical History:  Diagnosis Date  . Hip pain   . Right tennis elbow   . Thyroid goiter    benign    Past Surgical History:  Procedure Laterality Date  . TONSILLECTOMY  1986    There were no vitals filed for this visit.      Subjective Assessment - 06/11/17 0803    Subjective Pt took the day off on Tuesday after therapy session due to not feeling well; had to take Antivert.  Pt feeling better today; has progressed x 1 viewing to 60 seconds on compliant surface, still having difficulty with heel-toe.   Pertinent History Goiter, Lx spine DDD   Patient Stated Goals To feel safer when driving and stop the medication, walk in a safe manner.    Currently in Pain? No/denies            Vestibular Treatment/Exercise - 06/11/17 0806      Vestibular Treatment/Exercise   Vestibular Treatment Provided Gaze   Gaze Exercises X1 Viewing Horizontal;X1 Viewing Vertical     X1 Viewing Horizontal   Foot Position standing with partial tandem, solid surface and blank background; changed to solid surface, feet together busy background letter on horizontal blinds   Reps 3   Comments increased to 60 seconds, mild symptoms with busy background     X1 Viewing Vertical   Foot Position standing with partial tandem, solid surface and blank background; changed to solid surface, feet together busy background letter on balance master   Reps 3   Comments increased to 60 seconds, mild symptoms with busy background            Balance Exercises - 06/11/17 0827      Balance Exercises: Standing   Standing Eyes Opened Narrow base of support (BOS);Head turns;Foam/compliant surface;Other reps (comment)  10 reps head turns, nods, diagonals/partial tandem stance   Standing Eyes Closed Narrow base of support (BOS);Foam/compliant surface;10 secs  partial tandem stance   Rockerboard Anterior/posterior;Head turns;EO;EC;10 seconds;10 reps           PT Education - 06/11/17 1015    Education provided Yes   Education Details upgraded HEP   Person(s) Educated Patient   Methods Explanation;Demonstration;Handout   Comprehension Verbalized understanding          PT Short Term Goals - 05/26/17 1514      PT SHORT TERM GOAL #1   Title same as LTGs.           PT Long Term Goals - 06/08/17 0943      PT LONG TERM GOAL #1   Title Pt will be IND in HEP to improve dizziness, balance, and gait deviations. TARGET DATE FOR ALL LTGS: 06/23/17  Status On-going     PT LONG TERM GOAL #2   Title Pt will improve FGA score to 30/30 to decr. fall risk.    Status On-going     PT LONG TERM GOAL #3   Title Pt will report </=2/10 dizziness during amb. (1000') over even/uneven terrain, while performing head turns, in order to improve safety during functional mobility.    Status On-going     PT LONG TERM GOAL #4   Title Pt will maintain SOT composite score >70 with improvement in use of vestibular system to > or = to 50%   Baseline Vestibular: 30%   Status Revised     PT LONG TERM GOAL #5   Title Retest positional testing once pt not taking Meclizine and write goal as indicated.    Baseline No formal goal needed; all canals clear with patient off of Meclizine   Status  Achieved     PT LONG TERM GOAL #6   Title Pt will improve DHI score from 50%-32% to decr. impact dizziness has on functional abilities and to improve quality of life.    Status On-going               Plan - 06/11/17 1016    Clinical Impression Statement Treatment session with continued focus on vestibular and balance deficits and progressing HEP by narrowing BOS, compliant surfaces and introducing visually stimulating background into gaze adaptation.  Pt reporting mild symptoms with busier background but symptoms settled quickly.  Pt is making good progress; will assess LTG at next visit and anticipate pt will be ready for D/C.   Rehab Potential Good   Clinical Impairments Affecting Rehab Potential MD r/o vestibular migraines, pt has hx of goiter and Lx spine DDD   PT Frequency 2x / week   PT Duration 4 weeks   PT Treatment/Interventions ADLs/Self Care Home Management;Biofeedback;Canalith Repostioning;Neuromuscular re-education;Balance training;Therapeutic exercise;Therapeutic activities;Functional mobility training;Stair training;Gait training;DME Instruction;Orthotic Fit/Training;Patient/family education;Vestibular   PT Next Visit Plan Pt returns on 7/17-after recert date of 7/13.  Re-assess all LTG-likely be ready for D/C-will need to cancel remaining visits if D/C; D/C FOTO   Consulted and Agree with Plan of Care Patient      Patient will benefit from skilled therapeutic intervention in order to improve the following deficits and impairments:  Abnormal gait, Dizziness, Decreased balance, Pain  Visit Diagnosis: Dizziness and giddiness  Other abnormalities of gait and mobility     Problem List Patient Active Problem List   Diagnosis Date Noted  . Benign paroxysmal positional vertigo 05/11/2017  . Hyperlipidemia 03/15/2017  . Influenza-like illness 10/13/2016  . Abdominal pain, epigastric 08/04/2016  . Microscopic hematuria 07/14/2016  . Lumbar degenerative disc disease  05/25/2016  . Varicose vein of leg 06/04/2015  . Patellofemoral pain syndrome 06/04/2015  . Baker's cyst of right knee 02/19/2015  . Overactive bladder 09/24/2014  . Depression 08/27/2014  . Myofascial pain syndrome 08/27/2014  . Family history of breast cancer 09/12/2013  . Well woman exam 07/28/2012  . Goiter, nontoxic, multinodular 10/19/2008   Edman Circle, PT, DPT 06/11/17    8:05 PM    [facility] [address] [place], [place], [address] Phone: [phone]   Fax:  [phone]  Name: DEVENEY BAYON MRN: [id number] Date of Birth: 1981-08-23

## 2017-06-29 ENCOUNTER — Ambulatory Visit: Payer: Federal, State, Local not specified - PPO | Attending: Sports Medicine

## 2017-06-29 DIAGNOSIS — R42 Dizziness and giddiness: Secondary | ICD-10-CM | POA: Diagnosis not present

## 2017-06-29 DIAGNOSIS — R2689 Other abnormalities of gait and mobility: Secondary | ICD-10-CM | POA: Diagnosis present

## 2017-06-29 NOTE — Therapy (Addendum)
[facility] [address] [place], [place], [address] Phone: [phone]   Fax:  [phone]  Physical Therapy Treatment  Patient Details  Name: Dawn [name] MRN: [id number] Date of Birth: Nov 07, 1981 Referring Provider: Dr. Dianah Field  Encounter Date: 06/29/2017      PT End of Session - 06/29/17 0852    Visit Number 5   Number of Visits 9   Date for PT Re-Evaluation 06/25/17  renew for today   Authorization Type BCBS federal: 50 visit combined limit, no auth required   Authorization - Visit Number 5   Authorization - Number of Visits 49   PT Start Time 0803   PT Stop Time 0845  [place] completed [phone]   PT Time Calculation (min) 42 min   Equipment Utilized During Treatment --  SOT harness and min guard prn   Activity Tolerance Patient tolerated treatment well   Behavior During Therapy [facility] for tasks assessed/performed      Past Medical History:  Diagnosis Date  . Hip pain   . Right tennis elbow   . Thyroid goiter    benign    Past Surgical History:  Procedure Laterality Date  . TONSILLECTOMY  1986    There were no vitals filed for this visit.      Subjective Assessment - 06/29/17 0805    Subjective Pt reported dizziness is still intermittent and she does notice a HA with dizziness. Pt did not take Meclizine today but did take Excedrin for HA.   Pertinent History Goiter, Lx spine DDD   Patient Stated Goals To feel safer when driving and stop the medication, walk in a safe manner.    Currently in Pain? Yes   Pain Score 3    Pain Location Head   Pain Orientation --  temporal portion of head   Pain Descriptors / Indicators Pressure  combined with dizziness.   Pain Type Acute pain   Pain Onset Today   Pain Frequency Intermittent   Aggravating Factors  usually worse in morning and sensitive to light   Pain Relieving Factors medication            [facility] PT Assessment - 06/29/17 0817       Observation/Other Assessments   Focus on Therapeutic Outcomes (FOTO)  DHI: 6% indicates pt perceives dizziness to have mild impact on functional abilities.      Functional Gait  Assessment   Gait assessed  Yes   Gait Level Surface Walks 20 ft in less than 7 sec but greater than 5.5 sec, uses assistive device, slower speed, mild gait deviations, or deviates 6-10 in outside of the 12 in walkway width.  5.7 sec.   Change in Gait Speed Able to smoothly change walking speed without loss of balance or gait deviation. Deviate no more than 6 in outside of the 12 in walkway width.   Gait with Horizontal Head Turns Performs head turns smoothly with no change in gait. Deviates no more than 6 in outside 12 in walkway width   Gait with Vertical Head Turns Performs head turns with no change in gait. Deviates no more than 6 in outside 12 in walkway width.   Gait and Pivot Turn Pivot turns safely within 3 sec and stops quickly with no loss of balance.   Step Over Obstacle Is able to step over 2 stacked shoe boxes taped together (9 in total height) without changing gait speed. No evidence of imbalance.   Gait with  Narrow Base of Support Is able to ambulate for 10 steps heel to toe with no staggering.   Gait with Eyes Closed Walks 20 ft, no assistive devices, good speed, no evidence of imbalance, normal gait pattern, deviates no more than 6 in outside 12 in walkway width. Ambulates 20 ft in less than 7 sec.   Ambulating Backwards Walks 20 ft, no assistive devices, good speed, no evidence for imbalance, normal gait   Steps Alternating feet, no rail.   Total Score 29   FGA comment: 29/30: WNL                     [facility] Adult PT Treatment/Exercise - 06/29/17 0817      Ambulation/Gait   Ambulation/Gait Yes   Ambulation/Gait Assistance 7: Independent   Ambulation Distance (Feet) 1000 Feet   Assistive device None   Gait Pattern Within Functional Limits   Ambulation Surface  Level;Indoor;Unlevel;Outdoor;Paved   Gait Comments Neuro re-ed: pt amb. while performing head turns, with no incr. in dizziness. Baseline dizziness 3/10 to start (combined with HA pressure).      Neuro re-ed: Neuro re-ed: sensory organization test performed with following results: Conditions: 1: WNL 2: 1 trial below normal and 2 trials WNL 3: WNL  4: WNL 5: WNL 6: WNL Composite score: 77, WNL Sensory Analysis Som: WNL Vis: WNL Vest: WNL (~70) Pref: WNL Strategy analysis: Good use of ankle/hip strategy.       COG alignment: WNL                 PT Education - 06/29/17 0851    Education provided Yes   Education Details PT discussed goal progress and outcome measure results. PT discussed d/c, as pt's vestibular input has improved but dizziness appears to be consistent with vestibular migraines, which will need to be managed by MD. Pt verbalized understanding of HEP.   Person(s) Educated Patient   Methods Explanation   Comprehension Verbalized understanding          PT Short Term Goals - 05/26/17 1514      PT SHORT TERM GOAL #1   Title same as LTGs.           PT Long Term Goals - 06/29/17 0857      PT LONG TERM GOAL #1   Title Pt will be IND in HEP to improve dizziness, balance, and gait deviations. TARGET DATE FOR ALL LTGS: 06/23/17   Status Achieved     PT LONG TERM GOAL #2   Title Pt will improve FGA score to 30/30 to decr. fall risk.    Status Partially Met     PT LONG TERM GOAL #3   Title Pt will report </=2/10 dizziness during amb. (1000') over even/uneven terrain, while performing head turns, in order to improve safety during functional mobility.    Status Partially Met     PT LONG TERM GOAL #4   Title Pt will maintain SOT composite score >70 with improvement in use of vestibular system to > or = to 50%   Baseline Vestibular: 30%   Status Achieved     PT LONG TERM GOAL #5   Title Retest positional testing once pt not taking Meclizine and write  goal as indicated.    Baseline No formal goal needed; all canals clear with patient off of Meclizine   Status Achieved     PT LONG TERM GOAL #6   Title Pt will improve DHI score from 50%-32%  to decr. impact dizziness has on functional abilities and to improve quality of life.    Status Achieved               Plan - 06/29/17 1610    Clinical Impression Statement Pt met LTGs 1, 4, 5, and 6. Pt partially met LTGs 2 and 3, as pt presented with baseline 3/10 dizziness at beginning of session. PT discharging pt at this time, as vestibular input has improved and dizziness appears to be related to HA, which MD will manage. Please see d/c summary for details.    Rehab Potential Good   Clinical Impairments Affecting Rehab Potential MD r/o vestibular migraines, pt has hx of goiter and Lx spine DDD   PT Frequency 2x / week   PT Duration 4 weeks   PT Treatment/Interventions ADLs/Self Care Home Management;Biofeedback;Canalith Repostioning;Neuromuscular re-education;Balance training;Therapeutic exercise;Therapeutic activities;Functional mobility training;Stair training;Gait training;DME Instruction;Orthotic Fit/Training;Patient/family education;Vestibular   PT Next Visit Plan d/c   Consulted and Agree with Plan of Care Patient      Patient will benefit from skilled therapeutic intervention in order to improve the following deficits and impairments:  Abnormal gait, Dizziness, Decreased balance, Pain  Visit Diagnosis: Dizziness and giddiness - Plan: PT plan of care cert/re-cert  Other abnormalities of gait and mobility - Plan: PT plan of care cert/re-cert     Problem List Patient Active Problem List   Diagnosis Date Noted  . Benign paroxysmal positional vertigo 05/11/2017  . Hyperlipidemia 03/15/2017  . Influenza-like illness 10/13/2016  . Abdominal pain, epigastric 08/04/2016  . Microscopic hematuria 07/14/2016  . Lumbar degenerative disc disease 05/25/2016  . Varicose vein of leg  06/04/2015  . Patellofemoral pain syndrome 06/04/2015  . Baker's cyst of right knee 02/19/2015  . Overactive bladder 09/24/2014  . Depression 08/27/2014  . Myofascial pain syndrome 08/27/2014  . Family history of breast cancer 09/12/2013  . Well woman exam 07/28/2012  . Goiter, nontoxic, multinodular 10/19/2008    Cristin Penaflor L 06/29/2017, 9:02 AM  [facility] [address] [place], [place], [address] Phone: [phone]   Fax:  [phone]  Name: Dawn [name] MRN: [id number] Date of Birth: 05-03-1981  PHYSICAL THERAPY DISCHARGE SUMMARY  Visits from Start of Care: 5  Current functional level related to goals / functional outcomes:     PT Long Term Goals - 06/29/17 0857      PT LONG TERM GOAL #1   Title Pt will be IND in HEP to improve dizziness, balance, and gait deviations. TARGET DATE FOR ALL LTGS: 06/23/17   Status Achieved     PT LONG TERM GOAL #2   Title Pt will improve FGA score to 30/30 to decr. fall risk.    Status Partially Met     PT LONG TERM GOAL #3   Title Pt will report </=2/10 dizziness during amb. (1000') over even/uneven terrain, while performing head turns, in order to improve safety during functional mobility.    Status Partially Met     PT LONG TERM GOAL #4   Title Pt will maintain SOT composite score >70 with improvement in use of vestibular system to > or = to 50%   Baseline Vestibular: 30%   Status Achieved     PT LONG TERM GOAL #5   Title Retest positional testing once pt not taking Meclizine and write goal as indicated.    Baseline No formal goal needed; all canals clear with patient off of Meclizine   Status  Achieved     PT LONG TERM GOAL #6   Title Pt will improve DHI score from 50%-32% to decr. impact dizziness has on functional abilities and to improve quality of life.    Status Achieved        Remaining deficits: Dizziness with HA. Pt will need to be medically  managed for dizziness associated with HA.   Education / Equipment: HEP  Plan: Patient agrees to discharge.  Patient goals were met. Patient is being discharged due to meeting the stated rehab goals.  ?????        Geoffry Paradise, PT,DPT 06/29/17 9:02 AM Phone: [phone] Fax: [phone]

## 2017-09-14 ENCOUNTER — Encounter: Payer: Self-pay | Admitting: Sports Medicine

## 2017-09-14 ENCOUNTER — Ambulatory Visit (INDEPENDENT_AMBULATORY_CARE_PROVIDER_SITE_OTHER): Payer: Federal, State, Local not specified - PPO | Admitting: Sports Medicine

## 2017-09-14 DIAGNOSIS — H1132 Conjunctival hemorrhage, left eye: Secondary | ICD-10-CM | POA: Diagnosis not present

## 2017-09-14 DIAGNOSIS — Z23 Encounter for immunization: Secondary | ICD-10-CM | POA: Diagnosis not present

## 2017-09-14 DIAGNOSIS — Z803 Family history of malignant neoplasm of breast: Secondary | ICD-10-CM

## 2017-09-14 HISTORY — DX: Conjunctival hemorrhage, left eye: H11.32

## 2017-09-14 NOTE — Assessment & Plan Note (Signed)
No treatment needed, no changes in vision.  Avoid contact wear for the next couple of weeks.

## 2017-09-14 NOTE — Assessment & Plan Note (Signed)
Time to repeat mammogram. Flu shot today.

## 2017-09-14 NOTE — Progress Notes (Signed)
  Subjective:    CC: Lesion on eye  HPI: This is a pleasant 36 year old female, she woke up with a lesion on her left eye, reddish, no trauma. No visual changes. She has stopped wearing her contact lenses.  Screening measures: Due for a mammogram, strong family history of breast cancer, flu shot will be given today.  Past medical history:  Negative.  See flowsheet/record as well for more information.  Surgical history: Negative.  See flowsheet/record as well for more information.  Family history: Negative.  See flowsheet/record as well for more information.  Social history: Negative.  See flowsheet/record as well for more information.  Allergies, and medications have been entered into the medical record, reviewed, and no changes needed.   Review of Systems: No fevers, chills, night sweats, weight loss, chest pain, or shortness of breath.   Objective:    General: Well Developed, well nourished, and in no acute distress.  Neuro: Alert and oriented x3, extra-ocular muscles intact, sensation grossly intact.  HEENT: Normocephalic, atraumatic, pupils equal round reactive to light, neck supple, no masses, no lymphadenopathy, thyroid nonpalpable. Left-sided subconjunctival hematoma at the 10:00 position, is not cross the limbus. Skin: Warm and dry, no rashes. Cardiac: Regular rate and rhythm, no murmurs rubs or gallops, no lower extremity edema.  Respiratory: Clear to auscultation bilaterally. Not using accessory muscles, speaking in full sentences.  Impression and Recommendations:    Subconjunctival hematoma, left No treatment needed, no changes in vision.  Avoid contact wear for the next couple of weeks.  Family history of breast cancer Time to repeat mammogram. Flu shot today.  I spent 25 minutes with this patient, greater than 50% was face-to-face time counseling regarding the above diagnoses ___________________________________________ Ihor Austin. Benjamin Stain, M.D., ABFM.,  CAQSM. Primary Care and Sports Medicine [facility]  Adjunct Instructor of Family Medicine  [employer]

## 2017-09-14 NOTE — Patient Instructions (Signed)

## 2017-12-13 ENCOUNTER — Encounter: Payer: Self-pay | Admitting: Physician Assistant

## 2017-12-13 ENCOUNTER — Ambulatory Visit (INDEPENDENT_AMBULATORY_CARE_PROVIDER_SITE_OTHER): Payer: Federal, State, Local not specified - PPO | Admitting: Physician Assistant

## 2017-12-13 VITALS — BP 132/81 | HR 107 | Wt 153.0 lb

## 2017-12-13 DIAGNOSIS — M62830 Muscle spasm of back: Secondary | ICD-10-CM | POA: Insufficient documentation

## 2017-12-13 DIAGNOSIS — M5136 Other intervertebral disc degeneration, lumbar region: Secondary | ICD-10-CM | POA: Diagnosis not present

## 2017-12-13 DIAGNOSIS — N926 Irregular menstruation, unspecified: Secondary | ICD-10-CM | POA: Diagnosis not present

## 2017-12-13 HISTORY — DX: Muscle spasm of back: M62.830

## 2017-12-13 LAB — POCT URINE PREGNANCY: Preg Test, Ur: NEGATIVE

## 2017-12-13 MED ORDER — CYCLOBENZAPRINE HCL 10 MG PO TABS: 10.0000 mg | ORAL_TABLET | Freq: Three times a day (TID) | ORAL | 0 refills | Status: DC | PRN

## 2017-12-13 MED ORDER — MELOXICAM 15 MG PO TABS: 15.0000 mg | ORAL_TABLET | Freq: Every day | ORAL | 1 refills | Status: DC

## 2017-12-13 MED ORDER — PREDNISONE 50 MG PO TABS: ORAL_TABLET | ORAL | 0 refills | Status: DC

## 2017-12-13 MED ORDER — HYDROCODONE-ACETAMINOPHEN 5-325 MG PO TABS: 1.0000 | ORAL_TABLET | Freq: Three times a day (TID) | ORAL | 0 refills | Status: DC | PRN

## 2017-12-13 MED ORDER — KETOROLAC TROMETHAMINE 60 MG/2ML IM SOLN
60.0000 mg | Freq: Once | INTRAMUSCULAR | Status: AC
  Administered 2017-12-13: 60 mg via INTRAMUSCULAR

## 2017-12-13 NOTE — Progress Notes (Signed)
Subjective:    Patient ID: Dawn [name], female    DOB: 28-May-1981, 36 y.o.   MRN: [id number]  HPI  Pt is a 36 yo pleasant female with LDD who presents to the clinic with low back pain and muscle spasms left sided for last 4 days. Woke up on Friday morning feeling "stiff". She got up to go get ready and felt a "pull" and then her back locked up. She denies any injury. She has been exercising more for the last month. Pain is 7-8 with movement but at rest not painful. All flexion at waist causes pain. She has hx of this back pain. She has been through PT before. She has tried to do exercises, heat, tens units with some relief. She does feel a little better today but does not feel like she could go to work. She denies any bowel or bladder dysfunction, no saddle anesthesia, no numbness and tingling down legs.    She is concerned because they are trying to get pregnant and wants to make sure none of the medications effect potential pregnancy. She has not missed her cycle yet.   .. Active Ambulatory Problems    Diagnosis Date Noted  . Well woman exam 07/28/2012  . Family history of breast cancer 09/12/2013  . Depression 08/27/2014  . Myofascial pain syndrome 08/27/2014  . Goiter, nontoxic, multinodular 10/19/2008  . Overactive bladder 09/24/2014  . Varicose vein of leg 06/04/2015  . Patellofemoral pain syndrome 06/04/2015  . Lumbar degenerative disc disease 05/25/2016  . Microscopic hematuria 07/14/2016  . Hyperlipidemia 03/15/2017  . Benign paroxysmal positional vertigo 05/11/2017  . Subconjunctival hematoma, left 09/14/2017  . Spasm of muscle of lower back 12/13/2017   Resolved Ambulatory Problems    Diagnosis Date Noted  . Postpartum care following vaginal delivery 07/25/2011  . Multinodular goiter 07/25/2012  . Eustachian tube dysfunction 07/06/2013  . Myopathy 10/25/2014  . Hereditary and idiopathic peripheral neuropathy 10/25/2014  . Baker's cyst of right knee 02/19/2015  .  Sebaceous cyst 06/04/2015  . Arthralgia 07/31/2015  . Myalgia 07/31/2015  . Paresthesias 07/31/2015  . Shortness of breath 12/11/2015  . Peroneal tendinitis of right lower extremity 12/19/2015  . Viral pharyngitis 05/05/2016  . Fall 06/02/2016  . Left upper quadrant abdominal tenderness 07/07/2016  . Abdominal pain, epigastric 08/04/2016  . Influenza-like illness 10/13/2016   Past Medical History:  Diagnosis Date  . Hip pain   . Right tennis elbow   . Thyroid goiter         Review of Systems  All other systems reviewed and are negative.      Objective:   Physical Exam  Constitutional: She appears well-developed and well-nourished.  HENT:  Head: Normocephalic and atraumatic.  Musculoskeletal:  ROM significantly decreased due to pain. Not able to flex at waist at all.  15 degrees of lateral ROM to the left.  No pain to palpation over lumbar spine.  Tenderness and tightness to palpation over lumbar paraspinal muscle and over piriformis.   Psychiatric: She has a normal mood and affect. Her behavior is normal.          Assessment & Plan:   Marland Kitchen.Marland Kitchen.Herbert SetaHeather was seen today for back pain.  Diagnoses and all orders for this visit:  Lumbar degenerative disc disease -     predniSONE (DELTASONE) 50 MG tablet; Take one tablet for 5 days. -     cyclobenzaprine (FLEXERIL) 10 MG tablet; Take 1 tablet (10 mg total) by mouth 3 (  three) times daily as needed for muscle spasms. -     meloxicam (MOBIC) 15 MG tablet; Take 1 tablet (15 mg total) by mouth daily. -     HYDROcodone-acetaminophen (NORCO/VICODIN) 5-325 MG tablet; Take 1-2 tablets by mouth every 8 (eight) hours as needed for moderate pain.  Spasm of muscle of lower back -     predniSONE (DELTASONE) 50 MG tablet; Take one tablet for 5 days. -     cyclobenzaprine (FLEXERIL) 10 MG tablet; Take 1 tablet (10 mg total) by mouth 3 (three) times daily as needed for muscle spasms. -     meloxicam (MOBIC) 15 MG tablet; Take 1 tablet (15  mg total) by mouth daily. -     HYDROcodone-acetaminophen (NORCO/VICODIN) 5-325 MG tablet; Take 1-2 tablets by mouth every 8 (eight) hours as needed for moderate pain. -     ketorolac (TORADOL) injection 60 mg  Missed period -     POCT urine pregnancy   . Results for orders placed or performed in visit on 12/13/17  POCT urine pregnancy  Result Value Ref Range   Preg Test, Ur Negative Negative   Appear patient is having some muscle spasms of low back.  Confirmed not pregnant.  Toradol 60mg  IM given today.  Prednisone for 5 days given to start tomorrow.  mobic daily as needed. Discussed not to take if become pregnant.  Flexeril as needed. Sedation warning given.  norco small quantity for severe pain.  Continue heat, tens units, biofreeze and exercises.  If no improvement follow up in 2 weeks.  Ok to return to work 12/15/17.     [place] controlled substance database reviewed without any concerns. Acute supply #20 given of norco today.

## 2017-12-13 NOTE — Patient Instructions (Signed)
Continue to do exercises, heat, tens units.  norco only as needed for moderate/severe pain.  Muscle relaxer as needed.  Prednisone for 5 days.

## 2017-12-15 ENCOUNTER — Ambulatory Visit: Payer: Federal, State, Local not specified - PPO | Admitting: Family Medicine

## 2017-12-15 ENCOUNTER — Telehealth: Payer: Self-pay

## 2017-12-15 NOTE — Telephone Encounter (Signed)
Wednesday started the new medication and now her heart rate is 115-130 and she has some blurred vision problems. Please advise.

## 2017-12-15 NOTE — Telephone Encounter (Signed)
Patient advised.

## 2017-12-15 NOTE — Telephone Encounter (Signed)
Likely the prednisone. Stop prednisone. How is her back? Continue mobic, muscle relaxer.

## 2018-03-23 ENCOUNTER — Ambulatory Visit: Payer: Federal, State, Local not specified - PPO | Admitting: Family Medicine

## 2018-03-23 ENCOUNTER — Encounter: Payer: Self-pay | Admitting: Family Medicine

## 2018-03-23 VITALS — BP 118/80 | HR 92 | Ht 65.0 in | Wt 149.0 lb

## 2018-03-23 DIAGNOSIS — G43909 Migraine, unspecified, not intractable, without status migrainosus: Secondary | ICD-10-CM | POA: Diagnosis not present

## 2018-03-23 DIAGNOSIS — E042 Nontoxic multinodular goiter: Secondary | ICD-10-CM | POA: Diagnosis not present

## 2018-03-23 DIAGNOSIS — R202 Paresthesia of skin: Secondary | ICD-10-CM | POA: Diagnosis not present

## 2018-03-23 DIAGNOSIS — H8113 Benign paroxysmal vertigo, bilateral: Secondary | ICD-10-CM

## 2018-03-23 NOTE — Progress Notes (Signed)
Dawn [name] is a 37 y.o. female who presents to [facility]: Primary Care Sports Medicine today for facial numbness.  Few days ago patient experienced 10 minutes of numbness and tingling in her bilateral face.  This occurred while driving.  She does not think it was associate with any other symptoms.  She does not think that she was especially anxious denies any blurry vision or loss of function slurred speech.  She notes that she has been dealing with vertigo now for a while.  She has been attending vestibular therapy which is helped a bit.  Additionally she uses meclizine occasionally which helps some as well.  Her vestibular therapist is concerned that she may be having vestibular migraines causing the vertigo symptoms.  Additionally she notes that she is attempting to become pregnant.  She takes prenatal vitamins.  She denies any vertigo symptoms during her episode of facial paresthesia.   Past Medical History:  Diagnosis Date  . Hip pain   . Right tennis elbow   . Thyroid goiter    benign   Past Surgical History:  Procedure Laterality Date  . TONSILLECTOMY  1986   Social History   Tobacco Use  . Smoking status: Never Smoker  . Smokeless tobacco: Never Used  Substance Use Topics  . Alcohol use: Yes    Alcohol/week: 0.0 oz    Comment: 1-2 weekly   family history includes Birth defects in her maternal grandfather and maternal grandmother; Breast cancer in her sister; Breast cancer (age of onset: 55) in her maternal aunt; Breast cancer (age of onset: 60) in her maternal aunt; Cancer in her maternal grandfather and maternal grandmother; Hyperlipidemia in her father; Hypertension in her sister.  ROS as above:  Medications: Current Outpatient Medications  Medication Sig Dispense Refill  . Prenatal Vit-Fe Fumarate-FA (PRENATAL MULTIVITAMIN) TABS tablet Take 1 tablet by mouth daily at 12  noon.     No current facility-administered medications for this visit.    Allergies  Allergen Reactions  . Latex Other (See Comments)    Reaction: slight irritation    Health Maintenance Health Maintenance  Topic Date Due  . MAMMOGRAM  07/09/2017  . INFLUENZA VACCINE  07/14/2018  . PAP SMEAR  08/06/2019  . TETANUS/TDAP  08/24/2021  . HIV Screening  Completed     Exam:  BP 118/80   Pulse 92   Ht 5\' 5"  (1.651 m)   Wt 149 lb (67.6 kg)   BMI 24.79 kg/m  Gen: Well NAD HEENT: EOMI,  MMM Lungs: Normal work of breathing. CTABL Heart: RRR no MRG Abd: NABS, Soft. Nondistended, Nontender Exts: Brisk capillary refill, warm and well perfused.  Neuro: Alert and oriented normal cranial nerve exam.  Normal coordination and strength sensation and reflexes.  Normal gait.   No results found for this or any previous visit (from the past 72 hour(s)). No results found.    Assessment and Plan: 37 y.o. female with  Facial paresthesias.  Unclear etiology.  Currently asymptomatic with normal neurologic exam.  Plan for limited lab workup listed below.  Patient has a history of toxic multinodular goiter.  We will check metabolic panel thyroid B12 CBC.  If all is well plan for watchful waiting.  If symptoms recur patient may benefit from neurological workup at that time.   Orders Placed This Encounter  Procedures  . CBC  . COMPLETE METABOLIC PANEL WITH GFR  . Vitamin B12  . PTH, Intact  and Calcium  . TSH   No orders of the defined types were placed in this encounter.    Discussed warning signs or symptoms. Please see discharge instructions. Patient expresses understanding.

## 2018-03-23 NOTE — Patient Instructions (Signed)
Thank you for coming in today. Lets get labs today and keep an eye on it.  If you have continue recurrent issues we will get more testing and consider neurology referral.   Paresthesia Paresthesia is an abnormal burning or prickling sensation. This sensation is generally felt in the hands, arms, legs, or feet. However, it may occur in any part of the body. Usually, it is not painful. The feeling may be described as:  Tingling or numbness.  Pins and needles.  Skin crawling.  Buzzing.  Limbs falling asleep.  Itching.  Most people experience temporary (transient) paresthesia at some time in their lives. Paresthesia may occur when you breathe too quickly (hyperventilation). It can also occur without any apparent cause. Commonly, paresthesia occurs when pressure is placed on a nerve. The sensation quickly goes away after the pressure is removed. For some people, however, paresthesia is a long-lasting (chronic) condition that is caused by an underlying disorder. If you continue to have paresthesia, you may need further medical evaluation. Follow these instructions at home: Watch your condition for any changes. Taking the following actions may help to lessen any discomfort that you are feeling:  Avoid drinking alcohol.  Try acupuncture or massage to help relieve your symptoms.  Keep all follow-up visits as directed by your health care provider. This is important.  Contact a health care provider if:  You continue to have episodes of paresthesia.  Your burning or prickling feeling gets worse when you walk.  You have pain, cramps, or dizziness.  You develop a rash. Get help right away if:  You feel weak.  You have trouble walking or moving.  You have problems with speech, understanding, or vision.  You feel confused.  You cannot control your bladder or bowel movements.  You have numbness after an injury.  You faint. This information is not intended to replace advice given  to you by your health care provider. Make sure you discuss any questions you have with your health care provider. Document Released: 11/20/2002 Document Revised: 05/07/2016 Document Reviewed: 11/26/2014 Elsevier Interactive Patient Education  [employer].

## 2018-03-24 LAB — VITAMIN B12: VITAMIN B 12: 540 pg/mL (ref 200–1100)

## 2018-03-24 LAB — COMPLETE METABOLIC PANEL WITH GFR
AG Ratio: 2 (calc) (ref 1.0–2.5)
ALKALINE PHOSPHATASE (APISO): 44 U/L (ref 33–115)
ALT: 7 U/L (ref 6–29)
AST: 18 U/L (ref 10–30)
Albumin: 4.6 g/dL (ref 3.6–5.1)
BILIRUBIN TOTAL: 0.5 mg/dL (ref 0.2–1.2)
BUN: 12 mg/dL (ref 7–25)
CHLORIDE: 103 mmol/L (ref 98–110)
CO2: 27 mmol/L (ref 20–32)
CREATININE: 0.64 mg/dL (ref 0.50–1.10)
Calcium: 9 mg/dL (ref 8.6–10.2)
GFR, Est African American: 131 mL/min/{1.73_m2} (ref >=60)
GFR, Est Non African American: 113 mL/min/{1.73_m2} (ref >=60)
GLUCOSE: 90 mg/dL (ref 65–139)
Globulin: 2.3 g/dL (calc) (ref 1.9–3.7)
Potassium: 4.1 mmol/L (ref 3.5–5.3)
Sodium: 137 mmol/L (ref 135–146)
Total Protein: 6.9 g/dL (ref 6.1–8.1)

## 2018-03-24 LAB — PTH, INTACT AND CALCIUM
CALCIUM: 9 mg/dL (ref 8.6–10.2)
PTH: 92 pg/mL — AB (ref 14–64)

## 2018-03-24 LAB — CBC
HCT: 41.9 % (ref 35.0–45.0)
Hemoglobin: 13.6 g/dL (ref 11.7–15.5)
MCH: 28.2 pg (ref 27.0–33.0)
MCHC: 32.5 g/dL (ref 32.0–36.0)
MCV: 86.9 fL (ref 80.0–100.0)
MPV: 10.5 fL (ref 7.5–12.5)
PLATELETS: 231 10*3/uL (ref 140–400)
RBC: 4.82 10*6/uL (ref 3.80–5.10)
RDW: 13.1 % (ref 11.0–15.0)
WBC: 6.6 10*3/uL (ref 3.8–10.8)

## 2018-03-24 LAB — TSH: TSH: 1.61 m[IU]/L

## 2018-03-31 ENCOUNTER — Ambulatory Visit: Payer: Federal, State, Local not specified - PPO | Admitting: Sports Medicine

## 2018-03-31 ENCOUNTER — Encounter: Payer: Self-pay | Admitting: Sports Medicine

## 2018-03-31 DIAGNOSIS — Z3A01 Less than 8 weeks gestation of pregnancy: Secondary | ICD-10-CM

## 2018-03-31 DIAGNOSIS — G43909 Migraine, unspecified, not intractable, without status migrainosus: Secondary | ICD-10-CM | POA: Diagnosis not present

## 2018-03-31 DIAGNOSIS — Z3490 Encounter for supervision of normal pregnancy, unspecified, unspecified trimester: Secondary | ICD-10-CM | POA: Insufficient documentation

## 2018-03-31 DIAGNOSIS — R7989 Other specified abnormal findings of blood chemistry: Secondary | ICD-10-CM

## 2018-03-31 NOTE — Assessment & Plan Note (Signed)
Recently found out, LMP around March 13 or 14. She is taking prenatals, other medications have been discontinued. She will make an appointment with OB/GYN. I am going to check a beta-hCG.

## 2018-03-31 NOTE — Assessment & Plan Note (Addendum)
Rechecking PTH levels, she did have some perioral paresthesias which are typically more associated with hypocalcemia. These have since resolved. If persistently elevated I will have her touch base with Dr. Karen Kitchens with endocrinology.

## 2018-03-31 NOTE — Progress Notes (Signed)
Subjective:    CC: Follow-up  HPI: Dawn [name] is a pleasant 37 year old female, she was seen by one my partners with odd perioral paresthesias, labs were checked including a PTH level that came back elevated although her calcium levels were normal.  She does have a history of multinodular goiter.  Overall her oral paresthesias have resolved, she did recently find out that she was pregnant, she and her husband have been trying for a while, they have one other child.  Last menstrual period was the 13th of 14 March.  Overall happy with how things are going, she has self discontinued all of her other medications.  I reviewed the past medical history, family history, social history, surgical history, and allergies today and no changes were needed.  Please see the problem list section below in epic for further details.  Past Medical History: Past Medical History:  Diagnosis Date  . Hip pain   . Right tennis elbow   . Thyroid goiter    benign   Past Surgical History: Past Surgical History:  Procedure Laterality Date  . TONSILLECTOMY  1986   Social History: Social History   Socioeconomic History  . Marital status: Married    Spouse name: Trinna Post  . Number of children: 1  . Years of education: 12+  . Highest education level: Not on file  Occupational History  . 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 Financial resource strain: Not on file  . Food insecurity:    Worry: Not on file    Inability: Not on file  . Transportation needs:    Medical: Not on file    Non-medical: Not on file  Tobacco Use  . Smoking status: Never Smoker  . Smokeless tobacco: Never Used  Substance and Sexual Activity  . Alcohol use: Yes    Alcohol/week: 0.0 oz    Comment: 1-2 weekly  . Drug use: No  . Sexual activity: Yes    Birth control/protection: IUD    Comment: Mirena - September 2012  Lifestyle  . Physical activity:    Days per week: Not on file   Minutes per session: Not on file  . Stress: Not on file  Relationships  . Social connections:    Talks on phone: Not on file    Gets together: Not on file    Attends religious service: Not on file    Active member of club or organization: Not on file    Attends meetings of clubs or organizations: Not on file    Relationship status: Not on file  Other Topics Concern  . Not on file  Social History Narrative   Patient lives at home with husband and daughter.    Patient is right handed   Patient has one child   Patient has a college degree.    Caffeine: Coffee 3-4 cups/day    Family History: Family History  Problem Relation Age of Onset  . Hyperlipidemia Father   . Birth defects Maternal Grandmother   . Cancer Maternal Grandmother        Ovarian  . Birth defects Maternal Grandfather   . Cancer Maternal Grandfather   . Breast cancer Maternal Aunt 42  . Breast cancer Maternal Aunt 50  . Hypertension Sister   . Breast cancer Sister    Allergies: Allergies  Allergen Reactions  . Latex Other (See Comments)    Reaction: slight irritation   Medications: See med rec.  Review of  Systems: No fevers, chills, night sweats, weight loss, chest pain, or shortness of breath.   Objective:    General: Well Developed, well nourished, and in no acute distress.  Neuro: Alert and oriented x3, extra-ocular muscles intact, sensation grossly intact.  HEENT: Normocephalic, atraumatic, pupils equal round reactive to light, neck supple, no masses, no lymphadenopathy, thyroid nonpalpable.  Skin: Warm and dry, no rashes. Cardiac: Regular rate and rhythm, no murmurs rubs or gallops, no lower extremity edema.  Respiratory: Clear to auscultation bilaterally. Not using accessory muscles, speaking in full sentences.  Impression and Recommendations:    Pregnancy Recently found out, LMP around March 13 or 14. She is taking prenatals, other medications have been discontinued. She will make an  appointment with OB/GYN. I am going to check a beta-hCG.  Elevated PTHrP level Rechecking PTH levels, she did have some perioral paresthesias which are typically more associated with hypocalcemia. These have since resolved. If persistently elevated I will have her touch base with Dr. Karen Kitchens with endocrinology. ___________________________________________ Ihor Austin. Benjamin Stain, M.D., ABFM., CAQSM. Primary Care and Sports Medicine [facility]  Adjunct Instructor of Family Medicine  [employer]

## 2018-04-01 LAB — OBSTETRIC PANEL
Antibody Screen: NOT DETECTED
Basophils Absolute: 51 cells/uL (ref 0–200)
Basophils Relative: 0.8 %
Eosinophils Absolute: 250 {cells}/uL (ref 15–500)
Eosinophils Relative: 3.9 %
HCT: 42 % (ref 35.0–45.0)
Hemoglobin: 13.7 g/dL (ref 11.7–15.5)
Hepatitis B Surface Ag: NONREACTIVE
Lymphs Abs: 1402 cells/uL (ref 850–3900)
MCH: 28.6 pg (ref 27.0–33.0)
MCHC: 32.6 g/dL (ref 32.0–36.0)
MCV: 87.7 fL (ref 80.0–100.0)
MPV: 10.4 fL (ref 7.5–12.5)
Monocytes Relative: 8.3 %
Neutro Abs: 4166 {cells}/uL (ref 1500–7800)
Neutrophils Relative %: 65.1 %
Platelets: 228 Thousand/uL (ref 140–400)
RBC: 4.79 Million/uL (ref 3.80–5.10)
RDW: 12.8 % (ref 11.0–15.0)
RPR Ser Ql: NONREACTIVE
Rubella: 19.8 {index}
Total Lymphocyte: 21.9 %
WBC mixed population: 531 cells/uL (ref 200–950)
WBC: 6.4 Thousand/uL (ref 3.8–10.8)

## 2018-04-01 LAB — PTH, INTACT AND CALCIUM
Calcium: 9.4 mg/dL (ref 8.6–10.2)
PTH: 67 pg/mL — ABNORMAL HIGH (ref 14–64)

## 2018-04-01 LAB — HCG, SERUM, QUALITATIVE: Preg, Serum: POSITIVE — AB

## 2018-04-08 DIAGNOSIS — Z3A01 Less than 8 weeks gestation of pregnancy: Secondary | ICD-10-CM | POA: Diagnosis not present

## 2018-04-08 LAB — LIPID PANEL W/REFLEX DIRECT LDL
Cholesterol: 205 mg/dL — ABNORMAL HIGH (ref <200)
HDL: 71 mg/dL (ref >50)
LDL Cholesterol (Calc): 119 mg/dL — ABNORMAL HIGH
Non-HDL Cholesterol (Calc): 134 mg/dL (calc) — ABNORMAL HIGH (ref <130)
Total CHOL/HDL Ratio: 2.9 (calc) (ref <5.0)
Triglycerides: 62 mg/dL (ref <150)

## 2018-04-20 DIAGNOSIS — G43909 Migraine, unspecified, not intractable, without status migrainosus: Secondary | ICD-10-CM | POA: Diagnosis not present

## 2018-05-02 ENCOUNTER — Ambulatory Visit (INDEPENDENT_AMBULATORY_CARE_PROVIDER_SITE_OTHER): Payer: Federal, State, Local not specified - PPO | Admitting: Advanced Practice Midwife

## 2018-05-02 ENCOUNTER — Encounter: Payer: Self-pay | Admitting: Advanced Practice Midwife

## 2018-05-02 VITALS — BP 116/72 | HR 85 | Wt 147.0 lb

## 2018-05-02 DIAGNOSIS — O021 Missed abortion: Secondary | ICD-10-CM | POA: Diagnosis not present

## 2018-05-02 MED ORDER — IBUPROFEN 600 MG PO TABS: 600.0000 mg | ORAL_TABLET | Freq: Four times a day (QID) | ORAL | 1 refills | Status: DC | PRN

## 2018-05-02 MED ORDER — OXYCODONE-ACETAMINOPHEN 5-325 MG PO TABS: 1.0000 | ORAL_TABLET | Freq: Four times a day (QID) | ORAL | 0 refills | Status: DC | PRN

## 2018-05-02 MED ORDER — PROMETHAZINE HCL 25 MG PO TABS: 25.0000 mg | ORAL_TABLET | Freq: Four times a day (QID) | ORAL | 2 refills | Status: DC | PRN

## 2018-05-02 NOTE — Progress Notes (Signed)
PT has had some spotting since Saturday morning

## 2018-05-02 NOTE — Progress Notes (Signed)
Bedside U/S shows IUP with CRL of 10.95mm  GA is [redacted]w[redacted]d which is not consistant with her LMP.  There is no fetal cardiac activity.

## 2018-05-02 NOTE — Progress Notes (Signed)
   Subjective:    Patient ID: Dawn [name], female    DOB: 1981-06-11, 37 y.o.   MRN: [id number]  Dawn [name] is a 37 y.o. female G2P1001 (last baby 6 years ago) who presents for new OB appointment.  Bedside US done by Caprice Red RN which showed Single gestational sac with fetal pole measuring [redacted]w[redacted]d with no cardiac activity.  Yolk sac seen. Has had some spotting.    Vaginal Bleeding  The patient's primary symptoms include vaginal bleeding. The patient's pertinent negatives include no genital itching, genital lesions, genital odor or pelvic pain. This is a new problem. The current episode started in the past 7 days. The problem occurs intermittently. The problem has been unchanged. The patient is experiencing no pain. She is pregnant. Pertinent negatives include no abdominal pain, back pain, chills, constipation, diarrhea, fever, headaches, nausea or vomiting. The vaginal discharge was bloody. The vaginal bleeding is spotting. She has not been passing clots. She has not been passing tissue. Nothing aggravates the symptoms. She has tried nothing for the symptoms.    Review of Systems  Constitutional: Negative for chills and fever.  Gastrointestinal: Negative for abdominal pain, constipation, diarrhea, nausea and vomiting.  Genitourinary: Positive for vaginal bleeding. Negative for pelvic pain.  Musculoskeletal: Negative for back pain.  Neurological: Negative for headaches.       Objective:   Physical Exam  Constitutional: She is oriented to person, place, and time. She appears well-developed and well-nourished. No distress.  HENT:  Head: Normocephalic.  Cardiovascular: Normal rate.  Pulmonary/Chest: Effort normal.  Abdominal: Soft. She exhibits no distension and no mass. There is no tenderness. There is no rebound and no guarding.  Genitourinary:  Genitourinary Comments: Pelvic deferred Transvaginal ultrasound done Fetal demise confirmed  Musculoskeletal: Normal range of motion.  Neurological:  She is alert and oriented to person, place, and time.  Skin: Skin is warm and dry.  Psychiatric: She has a normal mood and affect.          Assessment & Plan:   A:   Single intrauterine pregnancy at [redacted]w[redacted]d        Missed abortion with fetal pole at [redacted]w[redacted]d  P:   Discussed options including expectant management, cytotec, D&C        Decided on expectant management for now         Discussed Cytotec and D&C, including risks and benefits Rx ibuprofen/percocet/phenergan for pain Bleeding precautions/MAU reviewed  Aviva Signs, CNM

## 2018-05-02 NOTE — Patient Instructions (Signed)

## 2018-05-04 ENCOUNTER — Encounter: Payer: Self-pay | Admitting: Sports Medicine

## 2018-05-04 DIAGNOSIS — E213 Hyperparathyroidism, unspecified: Secondary | ICD-10-CM

## 2018-05-05 ENCOUNTER — Encounter: Payer: Federal, State, Local not specified - PPO | Admitting: Obstetrics & Gynecology

## 2018-05-05 ENCOUNTER — Telehealth: Payer: Self-pay | Admitting: *Deleted

## 2018-05-05 MED ORDER — MISOPROSTOL 200 MCG PO TABS: ORAL_TABLET | ORAL | 0 refills | Status: DC

## 2018-05-05 NOTE — Telephone Encounter (Signed)
Pt called stating that she wanted to go ahead and get the Cytotec for her missed AB.  It has been 5 days and she has not started to bleeding.  Her recent Hbg was 13 and she already has the Phenergan and Percocet on hand.  Instructions given for the Cytotec.  Pt understands to call if any issues with the med or after.

## 2018-05-10 ENCOUNTER — Ambulatory Visit (INDEPENDENT_AMBULATORY_CARE_PROVIDER_SITE_OTHER): Payer: Federal, State, Local not specified - PPO | Admitting: *Deleted

## 2018-05-10 ENCOUNTER — Encounter: Payer: Self-pay | Admitting: *Deleted

## 2018-05-10 ENCOUNTER — Ambulatory Visit: Payer: Federal, State, Local not specified - PPO | Admitting: *Deleted

## 2018-05-10 VITALS — BP 115/69 | HR 92 | Ht 65.0 in | Wt 147.0 lb

## 2018-05-10 DIAGNOSIS — O039 Complete or unspecified spontaneous abortion without complication: Secondary | ICD-10-CM

## 2018-05-10 NOTE — Progress Notes (Signed)
Pt here for f/u SAB after taking Cytotec on Friday.  Pt states that she had 2 large clots around 5:30 on Friday and has been on and off bleeding since.  Today is is bleeding bright red.  Bedside U/S today shows empty uterus with endometrium measuring 6.45mm.  BHcg drawn and will f/u with patient accordingly.

## 2018-05-11 ENCOUNTER — Telehealth: Payer: Self-pay | Admitting: *Deleted

## 2018-05-11 LAB — HCG, QUANTITATIVE, PREGNANCY: HCG, TOTAL, QN: 1657 m[IU]/mL

## 2018-05-11 NOTE — Telephone Encounter (Signed)
Pt notified of BHCG results from SAB.  She will f/u with BHCG in 4 weeks or after her next period.  Pt instructed to call if she needs anything before then.  Explained to pt that will need to see her HCG levels less than 5.

## 2018-05-20 DIAGNOSIS — G43909 Migraine, unspecified, not intractable, without status migrainosus: Secondary | ICD-10-CM | POA: Diagnosis not present

## 2018-05-24 DIAGNOSIS — E042 Nontoxic multinodular goiter: Secondary | ICD-10-CM | POA: Diagnosis not present

## 2018-05-24 DIAGNOSIS — F410 Panic disorder [episodic paroxysmal anxiety] without agoraphobia: Secondary | ICD-10-CM | POA: Diagnosis not present

## 2018-05-24 DIAGNOSIS — E349 Endocrine disorder, unspecified: Secondary | ICD-10-CM | POA: Diagnosis not present

## 2018-05-30 DIAGNOSIS — F410 Panic disorder [episodic paroxysmal anxiety] without agoraphobia: Secondary | ICD-10-CM | POA: Diagnosis not present

## 2018-06-09 ENCOUNTER — Other Ambulatory Visit: Payer: Federal, State, Local not specified - PPO

## 2018-06-09 DIAGNOSIS — O039 Complete or unspecified spontaneous abortion without complication: Secondary | ICD-10-CM | POA: Diagnosis not present

## 2018-06-09 NOTE — Progress Notes (Signed)
t here for f/u BHCG from her SAB in May.

## 2018-06-10 ENCOUNTER — Telehealth: Payer: Self-pay | Admitting: *Deleted

## 2018-06-10 LAB — HCG, QUANTITATIVE, PREGNANCY: HCG, TOTAL, QN: 3 m[IU]/mL

## 2018-06-10 NOTE — Telephone Encounter (Signed)
Pt notified of normal HCG levels.

## 2018-06-19 ENCOUNTER — Other Ambulatory Visit: Payer: Self-pay | Admitting: Sports Medicine

## 2018-06-19 DIAGNOSIS — H8113 Benign paroxysmal vertigo, bilateral: Secondary | ICD-10-CM

## 2018-08-09 ENCOUNTER — Encounter: Payer: Self-pay | Admitting: *Deleted

## 2018-08-12 ENCOUNTER — Encounter: Payer: Self-pay | Admitting: Advanced Practice Midwife

## 2018-08-12 ENCOUNTER — Ambulatory Visit (INDEPENDENT_AMBULATORY_CARE_PROVIDER_SITE_OTHER): Payer: Federal, State, Local not specified - PPO | Admitting: Advanced Practice Midwife

## 2018-08-12 VITALS — BP 133/71 | HR 109 | Wt 156.0 lb

## 2018-08-12 DIAGNOSIS — O09891 Supervision of other high risk pregnancies, first trimester: Secondary | ICD-10-CM

## 2018-08-12 DIAGNOSIS — O09899 Supervision of other high risk pregnancies, unspecified trimester: Secondary | ICD-10-CM

## 2018-08-12 DIAGNOSIS — Z3480 Encounter for supervision of other normal pregnancy, unspecified trimester: Secondary | ICD-10-CM | POA: Diagnosis not present

## 2018-08-12 DIAGNOSIS — Z113 Encounter for screening for infections with a predominantly sexual mode of transmission: Secondary | ICD-10-CM

## 2018-08-12 DIAGNOSIS — E042 Nontoxic multinodular goiter: Secondary | ICD-10-CM

## 2018-08-12 DIAGNOSIS — O09521 Supervision of elderly multigravida, first trimester: Secondary | ICD-10-CM

## 2018-08-12 DIAGNOSIS — O09529 Supervision of elderly multigravida, unspecified trimester: Secondary | ICD-10-CM | POA: Insufficient documentation

## 2018-08-12 DIAGNOSIS — R7989 Other specified abnormal findings of blood chemistry: Secondary | ICD-10-CM

## 2018-08-12 HISTORY — DX: Supervision of elderly multigravida, unspecified trimester: O09.529

## 2018-08-12 HISTORY — DX: Supervision of other high risk pregnancies, unspecified trimester: O09.899

## 2018-08-12 LAB — POCT URINALYSIS DIPSTICK OB
Glucose, UA: NEGATIVE
POC,PROTEIN,UA: NEGATIVE

## 2018-08-12 MED ORDER — CONCEPT DHA 53.5-38-1 MG PO CAPS: 1.0000 | ORAL_CAPSULE | Freq: Every day | ORAL | 12 refills | Status: DC

## 2018-08-12 NOTE — Progress Notes (Signed)
Bedside U/S shows single IUP with FHT of 182 BPM and CRL is 29.29mm  GA 10 w

## 2018-08-12 NOTE — Progress Notes (Signed)
  Subjective:    Dawn [name] is being seen today for her first obstetrical visit.  This is a planned pregnancy. She is at [redacted]w[redacted]d gestation by LMP and [place] today. Her obstetrical history is significant for AMA. Relationship with FOB: spouse, living together. Patient does intend to breast feed. Pregnancy history fully reviewed.  Ha goiter and hyperparathyroidism. Nml F/U w/ endocrinology. No F/U needed.   Patient reports nausea.  Review of Systems:   Review of Systems  Gastrointestinal: Positive for nausea. Negative for abdominal pain and vomiting.  Genitourinary: Negative for dysuria, vaginal bleeding and vaginal discharge.    Objective:     BP 133/71   Pulse (!) 109   Wt 156 lb (70.8 kg)   LMP 06/03/2018   Breastfeeding? Unknown   BMI 25.96 kg/m  Physical Exam  Nursing note and vitals reviewed. Constitutional: She is oriented to person, place, and time. She appears well-developed and well-nourished. No distress.  Cardiovascular: Normal rate.  Respiratory: Effort normal. No respiratory distress.  GI: Soft. She exhibits no distension. There is no tenderness.  Genitourinary:  Genitourinary Comments: Declined  Musculoskeletal: She exhibits no edema.  Neurological: She is alert and oriented to person, place, and time. She has normal reflexes.  Skin: Skin is warm and dry.  Psychiatric: She has a normal mood and affect.      Fetal Exam Fetal Monitor Review: Mode: ultrasound.   Baseline rate: 152.         Assessment:    Pregnancy: G3P1011 Patient Active Problem List   Diagnosis Date Noted  . Pregnancy 03/31/2018  . Elevated PTHrP level 03/31/2018  . Subconjunctival hematoma, left 09/14/2017  . Benign paroxysmal positional vertigo 05/11/2017  . Hyperlipidemia 03/15/2017  . Lumbar degenerative disc disease 05/25/2016  . Varicose vein of leg 06/04/2015  . Patellofemoral pain syndrome 06/04/2015  . Overactive bladder 09/24/2014  . Depression 08/27/2014  .  Myofascial pain syndrome 08/27/2014  . Family history of breast cancer 09/12/2013  . Well woman exam 07/28/2012  . Nontoxic multinodular goiter 10/19/2008  . Goiter, nontoxic, multinodular 10/19/2008   1. Supervision of other normal pregnancy, antepartum  - Had Prenatal Panel drawn in April 2019, Nml. Will redraw Hep B w/ 28 week labs - GC/Chlamydia probe amp (Ken Caryl)not at [facility] [place] bedside; Future - Culture, OB Urine - Babyscripts Schedule Optimization - POC Urinalysis Dipstick OB - Prenat-FeFum-FePo-FA-Omega 3 (CONCEPT DHA) 53.5-38-1 MG CAPS; Take 1 tablet by mouth daily.  Dispense: 30 capsule; Refill: 12  2. Multigravida of advanced maternal age in first trimester  - Prenat-FeFum-FePo-FA-Omega 3 (CONCEPT DHA) 53.5-38-1 MG CAPS; Take 1 tablet by mouth daily.  Dispense: 30 capsule; Refill: 12  3. Goiter, nontoxic, multinodular  - Prenat-FeFum-FePo-FA-Omega 3 (CONCEPT DHA) 53.5-38-1 MG CAPS; Take 1 tablet by mouth daily.  Dispense: 30 capsule; Refill: 12  4. Supervision of other high risk pregnancy, antepartum  - Prenat-FeFum-FePo-FA-Omega 3 (CONCEPT DHA) 53.5-38-1 MG CAPS; Take 1 tablet by mouth daily.  Dispense: 30 capsule; Refill: 12   Plan:     Initial labs drawn. Prenatal vitamins. Problem list reviewed and updated. NIPS discussed: requested. Role of ultrasound in pregnancy discussed; fetal survey: requested. Amniocentesis discussed Follow up in 2 weeks. Discussed clinic routines, schedule of care and testing, genetic screening options, involvement of students and residents under the direct supervision of APPs and doctors and presence of female providers. Pt verbalized understanding.    Dawn [name] 08/12/2018

## 2018-08-12 NOTE — Patient Instructions (Signed)

## 2018-08-14 LAB — CULTURE, OB URINE

## 2018-08-14 LAB — URINE CULTURE, OB REFLEX: Organism ID, Bacteria: NO GROWTH

## 2018-08-16 ENCOUNTER — Telehealth: Payer: Self-pay

## 2018-08-16 ENCOUNTER — Encounter: Payer: Self-pay | Admitting: Advanced Practice Midwife

## 2018-08-16 ENCOUNTER — Inpatient Hospital Stay (HOSPITAL_COMMUNITY)
Admission: AD | Admit: 2018-08-16 | Discharge: 2018-08-16 | Disposition: A | Discharge status: Home / Self Care | Payer: Federal, State, Local not specified - PPO | Source: Ambulatory Visit | Attending: Obstetrics and Gynecology | Admitting: Obstetrics and Gynecology

## 2018-08-16 ENCOUNTER — Encounter (HOSPITAL_COMMUNITY): Payer: Self-pay

## 2018-08-16 ENCOUNTER — Other Ambulatory Visit: Payer: Self-pay

## 2018-08-16 DIAGNOSIS — O09291 Supervision of pregnancy with other poor reproductive or obstetric history, first trimester: Secondary | ICD-10-CM

## 2018-08-16 DIAGNOSIS — O209 Hemorrhage in early pregnancy, unspecified: Secondary | ICD-10-CM

## 2018-08-16 DIAGNOSIS — R109 Unspecified abdominal pain: Secondary | ICD-10-CM | POA: Insufficient documentation

## 2018-08-16 DIAGNOSIS — Z3491 Encounter for supervision of normal pregnancy, unspecified, first trimester: Secondary | ICD-10-CM

## 2018-08-16 DIAGNOSIS — O26891 Other specified pregnancy related conditions, first trimester: Secondary | ICD-10-CM | POA: Insufficient documentation

## 2018-08-16 DIAGNOSIS — Z3A10 10 weeks gestation of pregnancy: Secondary | ICD-10-CM | POA: Diagnosis not present

## 2018-08-16 HISTORY — DX: Other cervical disc displacement, unspecified cervical region: M50.20

## 2018-08-16 LAB — URINALYSIS, ROUTINE W REFLEX MICROSCOPIC
Bilirubin Urine: NEGATIVE
GLUCOSE, UA: NEGATIVE mg/dL
HGB URINE DIPSTICK: NEGATIVE
Ketones, ur: NEGATIVE mg/dL
Leukocytes, UA: NEGATIVE
Nitrite: NEGATIVE
Protein, ur: NEGATIVE mg/dL
SPECIFIC GRAVITY, URINE: 1.001 — AB (ref 1.005–1.030)
pH: 6 (ref 5.0–8.0)

## 2018-08-16 LAB — GC/CHLAMYDIA PROBE AMP (~~LOC~~) NOT AT ARMC
Chlamydia: NEGATIVE
Neisseria Gonorrhea: NEGATIVE

## 2018-08-16 NOTE — Discharge Instructions (Signed)
Pelvic Rest °Pelvic rest may be recommended if: °· Your placenta is partially or completely covering the opening of your cervix (placenta previa). °· There is bleeding between the wall of the uterus and the amniotic sac in the first trimester of pregnancy (subchorionic hemorrhage). °· You went into labor too early (preterm labor). ° °Based on your overall health and the health of your baby, your health care provider will decide if pelvic rest is right for you. °How do I rest my pelvis? °For as long as told by your health care provider: °· Do not have sex, sexual stimulation, or an orgasm. °· Do not use tampons. Do not douche. Do not put anything in your vagina. °· Do not lift anything that is heavier than 10 lb (4.5 kg). °· Avoid activities that take a lot of effort (are strenuous). °· Avoid any activity in which your pelvic muscles could become strained. ° °When should I seek medical care? °Seek medical care if you have: °· Cramping pain in your lower abdomen. °· Vaginal discharge. °· A low, dull backache. °· Regular contractions. °· Uterine tightening. ° °When should I seek immediate medical care? °Seek immediate medical care if: °· You have vaginal bleeding and you are pregnant. ° °This information is not intended to replace advice given to you by your health care provider. Make sure you discuss any questions you have with your health care provider. °Document Released: 03/27/2011 Document Revised: 05/07/2016 Document Reviewed: 06/03/2015 °Elsevier Interactive Patient Education © 2018 Elsevier Inc. ° °

## 2018-08-16 NOTE — Telephone Encounter (Signed)
Pt is [redacted]w[redacted]d pregnant calling stating that she is having stomach cramps, back pain & bleeding which is the amount of the start of a period. I spoke with Armandina Stammer, RN and we agreed to tell pt to be seen at [facility]. Pt expressed understanding.

## 2018-08-16 NOTE — MAU Note (Signed)
Pt. States lower back and abdominal pain started a few days ago.  Pt reports some light bleeding since this am.  Pt. Reports last sex 2 weeks ago.

## 2018-08-16 NOTE — MAU Provider Note (Signed)
History     CSN: [id number]  Arrival date and time: 08/16/18 1036   First Provider Initiated Contact with Patient 08/16/18 1108      Chief Complaint  Patient presents with  . Abdominal Pain    lower  . Back Pain    lower  . Vaginal Bleeding    spotting pink   HPI   [name] is a 37 y.o. female G3P1011 @ [redacted]w[redacted]d here in MAU with complaints of vaginal bleeding, abdominal cramping and lower back pain. Spotting started this morning, pain started yesterday. History of SAB and patient is very anxious. No burning or itching. Spotting was very light, not enough to wear a pad. She currently rates her pain 3/10; she took tylenol this morning.  Patient has an [place] in the office done.   OB History    Gravida  3   Para  1   Term  1   Preterm      AB  1   Living  1     SAB  1   TAB      Ectopic      Multiple      Live Births  1           Past Medical History:  Diagnosis Date  . Herniated cervical disc 2016  . Hip pain   . Right tennis elbow   . Thyroid goiter    benign    Past Surgical History:  Procedure Laterality Date  . TONSILLECTOMY  1986    Family History  Problem Relation Age of Onset  . Hyperlipidemia Father   . Birth defects Maternal Grandmother   . Cancer Maternal Grandmother        Ovarian  . Birth defects Maternal Grandfather   . Cancer Maternal Grandfather   . Breast cancer Maternal Aunt 42  . Breast cancer Maternal Aunt 50  . Hypertension Sister   . Breast cancer Sister     Social History   Tobacco Use  . Smoking status: Never Smoker  . Smokeless tobacco: Never Used  Substance Use Topics  . Alcohol use: Yes    Alcohol/week: 0.0 standard drinks    Comment: 1-2 weekly  . Drug use: No    Allergies:  Allergies  Allergen Reactions  . Latex Other (See Comments)    Reaction: slight irritation    Medications Prior to Admission  Medication Sig Dispense Refill Last Dose  . acetaminophen (TYLENOL) 80 MG chewable tablet  Chew 80 mg by mouth every 6 (six) hours as needed.   08/16/2018 at Unknown time  . Prenat-FeFum-FePo-FA-Omega 3 (CONCEPT DHA) 53.5-38-1 MG CAPS Take 1 tablet by mouth daily. 30 capsule 12 08/16/2018 at Unknown time  . Prenatal Vit-Fe Fumarate-FA (PRENATAL MULTIVITAMIN) TABS tablet Take 1 tablet by mouth daily at 12 noon.   Unknown at Unknown time   Results for orders placed or performed during the hospital encounter of 08/16/18 (from the past 48 hour(s))  Urinalysis, Routine w reflex microscopic     Status: Abnormal   Collection Time: 08/16/18 10:46 AM  Result Value Ref Range   Color, Urine COLORLESS (A) YELLOW   APPearance CLEAR CLEAR   Specific Gravity, Urine 1.001 (L) 1.005 - 1.030   pH 6.0 5.0 - 8.0   Glucose, UA NEGATIVE NEGATIVE mg/dL   Hgb urine dipstick NEGATIVE NEGATIVE   Bilirubin Urine NEGATIVE NEGATIVE   Ketones, ur NEGATIVE NEGATIVE mg/dL   Protein, ur NEGATIVE NEGATIVE mg/dL   Nitrite  NEGATIVE NEGATIVE   Leukocytes, UA NEGATIVE NEGATIVE    Comment: Performed at [facility], [address] [place], [place] [address]    Review of Systems  Constitutional: Negative for fever.  Gastrointestinal: Positive for abdominal pain.  Genitourinary: Positive for vaginal bleeding.   Physical Exam   Blood pressure 136/68, pulse 100, temperature 98 F (36.7 C), temperature source Oral, resp. rate 20, height 5\' 5"  (1.651 m), weight 70.8 kg, last menstrual period 06/03/2018, SpO2 98 %, unknown if currently breastfeeding.  Physical Exam  Constitutional: She is oriented to person, place, and time. She appears well-developed and well-nourished. No distress.  HENT:  Head: Normocephalic.  Eyes: Pupils are equal, round, and reactive to light.  Neck: Neck supple.  Respiratory: Effort normal.  GI: Soft. She exhibits no distension. There is no tenderness. There is no rebound.  Genitourinary:  Genitourinary Comments: Bimanual exam: Cervix closed, posterior  Small amount of dark brown  blood noted on exam glove. Chaperone present for exam.   Neurological: She is alert and oriented to person, place, and time.  Skin: Skin is warm. She is not diaphoretic.  Psychiatric: Her behavior is normal.   MAU Course  Procedures   Pt informed that the ultrasound is considered a limited OB ultrasound and is not intended to be a complete ultrasound exam.  Patient also informed that the ultrasound is not being completed with the intent of assessing for fetal or placental anomalies or any pelvic abnormalities.  Explained that the purpose of today's ultrasound is to assess for  viability.  Patient acknowledges the purpose of the exam and the limitations of the study.    MDM  AB positive blood type.  Bedside US shows active fetus with fetal hr in 140's Patient reassured  UA   Assessment and Plan   A:    ICD-10-CM   1. Vaginal bleeding in pregnancy, first trimester O20.9   2. [redacted] weeks gestation of pregnancy Z3A.10   3. Presence of fetal heart sounds in first trimester Z34.91   4. History of miscarriage, currently pregnant, first trimester O09.291     P:  Discharge home in stable condition  Pelvic rest  Bleeding precautions Return to MAU if symptoms worsen  F/U in the office as scheduled Support given  Venia Carbon I, NP 08/16/2018 4:31 PM

## 2018-08-17 DIAGNOSIS — O09899 Supervision of other high risk pregnancies, unspecified trimester: Secondary | ICD-10-CM

## 2018-08-17 DIAGNOSIS — O09529 Supervision of elderly multigravida, unspecified trimester: Secondary | ICD-10-CM

## 2018-08-26 ENCOUNTER — Ambulatory Visit (INDEPENDENT_AMBULATORY_CARE_PROVIDER_SITE_OTHER): Payer: Federal, State, Local not specified - PPO

## 2018-08-26 VITALS — BP 115/73 | HR 96 | Wt 158.0 lb

## 2018-08-26 DIAGNOSIS — Z3480 Encounter for supervision of other normal pregnancy, unspecified trimester: Secondary | ICD-10-CM | POA: Diagnosis not present

## 2018-08-26 DIAGNOSIS — Z23 Encounter for immunization: Secondary | ICD-10-CM | POA: Diagnosis not present

## 2018-08-26 DIAGNOSIS — Z3481 Encounter for supervision of other normal pregnancy, first trimester: Secondary | ICD-10-CM

## 2018-08-26 DIAGNOSIS — O09899 Supervision of other high risk pregnancies, unspecified trimester: Secondary | ICD-10-CM

## 2018-08-26 DIAGNOSIS — O09891 Supervision of other high risk pregnancies, first trimester: Secondary | ICD-10-CM

## 2018-08-26 NOTE — Progress Notes (Signed)
   PRENATAL VISIT NOTE  Subjective:  Dawn [name] is a 37 y.o. G3P1011 at [redacted]w[redacted]d being seen today for ongoing prenatal care.  She is currently monitored for the following issues for this high-risk pregnancy and has Nontoxic multinodular goiter; Family history of breast cancer; Depression; Myofascial pain syndrome; Goiter, nontoxic, multinodular; Overactive bladder; Varicose vein of leg; Patellofemoral pain syndrome; Lumbar degenerative disc disease; Hyperlipidemia; Benign paroxysmal positional vertigo; Subconjunctival hematoma, left; Pregnancy; Elevated PTHrP level; AMA (advanced maternal age) multigravida 35+; and Supervision of other high risk pregnancy, antepartum on their problem list.  Patient reports no complaints.  Contractions: Not present. Vag. Bleeding: None.  Movement: Absent. Denies leaking of fluid.   The following portions of the patient's history were reviewed and updated as appropriate: allergies, current medications, past family history, past medical history, past social history, past surgical history and problem list. Problem list updated.  Objective:   Vitals:   08/26/18 0819  BP: 115/73  Pulse: 96  Weight: 158 lb (71.7 kg)    Fetal Status: Fetal Heart Rate (bpm): 173   Movement: Absent     General:  Alert, oriented and cooperative. Patient is in no acute distress.  Skin: Skin is warm and dry. No rash noted.   Cardiovascular: Normal heart rate noted  Respiratory: Normal respiratory effort, no problems with respiration noted  Abdomen: Soft, gravid, appropriate for gestational age.  Pain/Pressure: Absent     Pelvic: Cervical exam deferred        Extremities: Normal range of motion.  Edema: None  Mental Status: Normal mood and affect. Normal behavior. Normal judgment and thought content.   Assessment and Plan:  Pregnancy: G3P1011 at [redacted]w[redacted]d  1. Supervision of other normal pregnancy, antepartum - No complaints. Routine care - Flu shot today - Genetic Screening -  Vitamin D (25 hydroxy) - Anatomy u/s ordered for after 17 weeks.  Preterm labor symptoms and general obstetric precautions including but not limited to vaginal bleeding, contractions, leaking of fluid and fetal movement were reviewed in detail with the patient. Please refer to After Visit Summary for other counseling recommendations.  Return in about 8 weeks (around 10/21/2018) for Return OB visit.   Rolm Bookbinder, CNM 08/26/18 8:36 AM

## 2018-08-26 NOTE — Patient Instructions (Signed)

## 2018-08-27 LAB — VITAMIN D 25 HYDROXY (VIT D DEFICIENCY, FRACTURES): Vit D, 25-Hydroxy: 37 ng/mL (ref 30–100)

## 2018-09-03 ENCOUNTER — Other Ambulatory Visit: Payer: Self-pay

## 2018-09-06 ENCOUNTER — Telehealth: Payer: Self-pay

## 2018-09-06 NOTE — Telephone Encounter (Signed)
Spoke with pt and she is aware of negative genetic screening results and that the predicted sex is female.

## 2018-10-05 ENCOUNTER — Encounter (HOSPITAL_COMMUNITY): Payer: Self-pay

## 2018-10-13 ENCOUNTER — Encounter (HOSPITAL_COMMUNITY): Payer: Self-pay

## 2018-10-13 ENCOUNTER — Ambulatory Visit (HOSPITAL_COMMUNITY)
Admission: RE | Admit: 2018-10-13 | Discharge: 2018-10-13 | Disposition: A | Discharge status: Home / Self Care | Payer: Federal, State, Local not specified - PPO | Source: Ambulatory Visit

## 2018-10-13 DIAGNOSIS — Z363 Encounter for antenatal screening for malformations: Secondary | ICD-10-CM

## 2018-10-13 DIAGNOSIS — Z3482 Encounter for supervision of other normal pregnancy, second trimester: Secondary | ICD-10-CM | POA: Insufficient documentation

## 2018-10-13 DIAGNOSIS — O09899 Supervision of other high risk pregnancies, unspecified trimester: Secondary | ICD-10-CM

## 2018-10-13 DIAGNOSIS — Z3A18 18 weeks gestation of pregnancy: Secondary | ICD-10-CM | POA: Diagnosis not present

## 2018-10-13 DIAGNOSIS — O09522 Supervision of elderly multigravida, second trimester: Secondary | ICD-10-CM | POA: Diagnosis not present

## 2018-10-13 DIAGNOSIS — O09529 Supervision of elderly multigravida, unspecified trimester: Secondary | ICD-10-CM

## 2018-10-13 DIAGNOSIS — Z3480 Encounter for supervision of other normal pregnancy, unspecified trimester: Secondary | ICD-10-CM

## 2018-10-13 HISTORY — DX: Depression, unspecified: F32.A

## 2018-10-13 HISTORY — DX: Major depressive disorder, single episode, unspecified: F32.9

## 2018-10-21 ENCOUNTER — Ambulatory Visit (INDEPENDENT_AMBULATORY_CARE_PROVIDER_SITE_OTHER): Payer: Federal, State, Local not specified - PPO | Admitting: Student

## 2018-10-21 VITALS — BP 120/71 | HR 86 | Wt 162.0 lb

## 2018-10-21 DIAGNOSIS — Z3480 Encounter for supervision of other normal pregnancy, unspecified trimester: Secondary | ICD-10-CM | POA: Diagnosis not present

## 2018-10-21 DIAGNOSIS — Z3482 Encounter for supervision of other normal pregnancy, second trimester: Secondary | ICD-10-CM | POA: Diagnosis not present

## 2018-10-21 DIAGNOSIS — O09899 Supervision of other high risk pregnancies, unspecified trimester: Secondary | ICD-10-CM

## 2018-10-21 MED ORDER — FAMOTIDINE 20 MG PO CHEW: 20.0000 mg | CHEWABLE_TABLET | Freq: Two times a day (BID) | ORAL | 1 refills | Status: DC

## 2018-10-21 NOTE — Progress Notes (Signed)
Pt c/o occasional headaches

## 2018-10-21 NOTE — Progress Notes (Signed)
Patient ID: Dawn [name], female   DOB: [birth year], 37 y.o.   MRN: [id number]   PRENATAL VISIT NOTE  Subjective:  Dawn [name] is a 37 y.o. G3P1011 at [redacted]w[redacted]d being seen today for ongoing prenatal care.  She is currently monitored for the following issues for this low-risk pregnancy and has Nontoxic multinodular goiter; Family history of breast cancer; Depression; Myofascial pain syndrome; Goiter, nontoxic, multinodular; Overactive bladder; Varicose vein of leg; Patellofemoral pain syndrome; Lumbar degenerative disc disease; Hyperlipidemia; Benign paroxysmal positional vertigo; Subconjunctival hematoma, left; Pregnancy; Elevated PTHrP level; AMA (advanced maternal age) multigravida 35+; and Supervision of other high risk pregnancy, antepartum on their problem list.  Patient reports occasional pain bilaterally in her arm pit. She thinks it could be breast tissue/hormonal. No lumps or masses palpated. .  Contractions: Not present. Vag. Bleeding: None.  Movement: Present. Denies leaking of fluid.   The following portions of the patient's history were reviewed and updated as appropriate: allergies, current medications, past family history, past medical history, past social history, past surgical history and problem list. Problem list updated.  Objective:   Vitals:   10/21/18 0814  BP: 120/71  Pulse: 86  Weight: 162 lb (73.5 kg)    Fetal Status: Fetal Heart Rate (bpm): 146 Fundal Height: 18 cm Movement: Present     General:  Alert, oriented and cooperative. Patient is in no acute distress.  Skin: Skin is warm and dry. No rash noted.   Cardiovascular: Normal heart rate noted  Respiratory: Normal respiratory effort, no problems with respiration noted  Abdomen: Soft, gravid, appropriate for gestational age.  Pain/Pressure: Absent     Pelvic: Cervical exam deferred        Extremities: Normal range of motion.  Edema: None. Axilla is non-tender; no lumps or masses palpated.   Mental Status:  Normal mood and affect. Normal behavior. Normal judgment and thought content.   Assessment and Plan:  Pregnancy: G3P1011 at [redacted]w[redacted]d  1. Supervision of other normal pregnancy, antepartum - Alpha fetoprotein, maternal  2. Supervision of other high risk pregnancy, antepartum -Pepcid for heartburn, comfort measures discussed.  -2 hour gtt next visit -baby Scripts BP reviewed; all normal.   Preterm labor symptoms and general obstetric precautions including but not limited to vaginal bleeding, contractions, leaking of fluid and fetal movement were reviewed in detail with the patient. Please refer to After Visit Summary for other counseling recommendations.  Return in about 8 weeks (around 12/16/2018), or LROB.  Future Appointments  Date Time Provider Department Center  12/16/2018  8:15 AM Rolm Bookbinder, CNM [facility]    Charles[facility] Garibaldi [place], [place]

## 2018-10-21 NOTE — Patient Instructions (Signed)
Heartburn During Pregnancy Heartburn is pain or discomfort in the throat or chest. It may cause a burning feeling. It happens when stomach acid moves up into the tube that carries food from your mouth to your stomach (esophagus). Heartburn is common during pregnancy. It usually goes away or gets better after giving birth. Follow these instructions at home: Eating and drinking  Do not drink alcohol while you are pregnant.  Figure out which foods and beverages make you feel worse, and avoid them.  Beverages that you may want to avoid include: ? Coffee and tea (with or without caffeine). ? Energy drinks and sports drinks. ? Bubbly (carbonated) drinks or sodas. ? Citrus fruit juices.  Foods that you may want to avoid include: ? Chocolate and cocoa. ? Peppermint and mint flavorings. ? Garlic, onions, and horseradish. ? Spicy and acidic foods. These include peppers, chili powder, curry powder, vinegar, hot sauces, and barbecue sauce. ? Citrus fruits, such as oranges, lemons, and limes. ? Tomato-based foods, such as red sauce, chili, and salsa. ? Fried and fatty foods, such as donuts, french fries, potato chips, and high-fat dressings. ? High-fat meats, such as hot dogs, cold cuts, sausage, ham, and bacon. ? High-fat dairy items, such as whole milk, butter, and cheese.  Eat small meals often, instead of large meals.  Avoid drinking a lot of liquid with your meals.  Avoid eating meals during the 2-3 hours before you go to bed.  Avoid lying down right after you eat.  Do not exercise right after you eat. Medicines  Take over-the-counter and prescription medicines only as told by your doctor.  Do not take aspirin, ibuprofen, or other NSAIDs unless your doctor tells you to do that.  Your doctor may tell you to avoid medicines that have sodium bicarbonate in them. General instructions  If told, raise the head of your bed about 6 inches (15 cm). You can do this by putting blocks under  the legs. Sleeping with more pillows does not help with heartburn.  Do not use any products that contain nicotine or tobacco, such as cigarettes and e-cigarettes. If you need help quitting, ask your doctor.  Wear loose-fitting clothing.  Try to lower your stress, such as with yoga or meditation. If you need help, ask your doctor.  Stay at a healthy weight. If you are overweight, work with your doctor to safely lose weight.  Keep all follow-up visits as told by your doctor. This is important. Contact a doctor if:  You get new symptoms.  Your symptoms do not get better with treatment.  You have weight loss and you do not know why.  You have trouble swallowing.  You make loud sounds when you breathe (wheeze).  You have a cough that does not go away.  You have heartburn often for more than 2 weeks.  You feel sick to your stomach (nauseous), and this does not get better with treatment.  You are throwing up (vomiting), and this does not get better with treatment.  You have pain in your belly (abdomen). Get help right away if:  You have very bad chest pain that spreads to your arm, neck, or jaw.  You feel sweaty, dizzy, or light-headed.  You have trouble breathing.  You have pain when swallowing.  You throw up and your throw-up looks like blood or coffee grounds.  Your poop (stool) is bloody or black. This information is not intended to replace advice given to you by your health care provider.   Make sure you discuss any questions you have with your health care provider. Document Released: 01/02/2011 Document Revised: 08/17/2016 Document Reviewed: 08/17/2016 Elsevier Interactive Patient Education  2017 [employer] Inc[employer]  

## 2018-10-23 LAB — ALPHA FETOPROTEIN, MATERNAL
AFP MOM: 1.29
AFP, Serum: 68.2 ng/mL
CALC'D GESTATIONAL AGE: 20 wk
Maternal Wt: 162 [lb_av]
Risk for ONTD: 1
Twins-AFP: 1

## 2018-11-14 ENCOUNTER — Ambulatory Visit: Payer: Federal, State, Local not specified - PPO | Admitting: Family Medicine

## 2018-11-14 ENCOUNTER — Ambulatory Visit: Payer: Federal, State, Local not specified - PPO | Admitting: Physician Assistant

## 2018-11-14 ENCOUNTER — Encounter: Payer: Self-pay | Admitting: Physician Assistant

## 2018-11-14 ENCOUNTER — Telehealth: Payer: Self-pay | Admitting: *Deleted

## 2018-11-14 VITALS — BP 126/68 | HR 91 | Ht 65.0 in | Wt 162.0 lb

## 2018-11-14 DIAGNOSIS — M62830 Muscle spasm of back: Secondary | ICD-10-CM | POA: Diagnosis not present

## 2018-11-14 DIAGNOSIS — M5136 Other intervertebral disc degeneration, lumbar region: Secondary | ICD-10-CM | POA: Diagnosis not present

## 2018-11-14 MED ORDER — PREDNISONE 50 MG PO TABS: ORAL_TABLET | ORAL | 0 refills | Status: DC

## 2018-11-14 MED ORDER — HYDROCODONE-ACETAMINOPHEN 5-325 MG PO TABS: 1.0000 | ORAL_TABLET | Freq: Three times a day (TID) | ORAL | 0 refills | Status: AC | PRN

## 2018-11-14 MED ORDER — KETOROLAC TROMETHAMINE 60 MG/2ML IM SOLN
60.0000 mg | Freq: Once | INTRAMUSCULAR | Status: AC
  Administered 2018-11-14: 60 mg via INTRAMUSCULAR

## 2018-11-14 NOTE — Progress Notes (Signed)
Subjective:    Patient ID: Dawn [name], female    DOB: [birth year], 37 y.o.   MRN: [id number]  HPI  Pt is a 37 yo female who is [redacted] weeks pregnant with Lumbar DDD and hx of low back muscles spasms who presents to the clinic in exacerbation. She was doing laundry last night and twisted the wrong way. She has had spasms and tightness and pain of the left lower back into left buttocks. She rates the pain 8/10 at times. She denies any radiation into legs, no saddle anesthesia, no bowel or bladder dysfunction. She has tried tylenol and heat with little results. She called OB this morning and they ok'd her to get toradol shot for back pain if it would help. Her last exacerbation was 11/2017.  .. Active Ambulatory Problems    Diagnosis Date Noted  . Nontoxic multinodular goiter 10/19/2008  . Family history of breast cancer 09/12/2013  . Depression 08/27/2014  . Myofascial pain syndrome 08/27/2014  . Goiter, nontoxic, multinodular 10/19/2008  . Overactive bladder 09/24/2014  . Varicose vein of leg 06/04/2015  . Patellofemoral pain syndrome 06/04/2015  . Lumbar degenerative disc disease 05/25/2016  . Hyperlipidemia 03/15/2017  . Benign paroxysmal positional vertigo 05/11/2017  . Subconjunctival hematoma, left 09/14/2017  . Muscle spasm of back 12/13/2017  . Pregnancy 03/31/2018  . Elevated PTHrP level 03/31/2018  . AMA (advanced maternal age) multigravida 35+ 08/12/2018  . Supervision of other high risk pregnancy, antepartum 08/12/2018   Resolved Ambulatory Problems    Diagnosis Date Noted  . Postpartum care following vaginal delivery 07/25/2011  . Well woman exam 07/28/2012  . Eustachian tube dysfunction 07/06/2013  . Myopathy 10/25/2014  . Hereditary and idiopathic peripheral neuropathy 10/25/2014  . Baker's cyst of right knee 02/19/2015  . Sebaceous cyst 06/04/2015  . Arthralgia 07/31/2015  . Myalgia 07/31/2015  . Paresthesias 07/31/2015  . Shortness of breath 12/11/2015  .  Peroneal tendinitis of right lower extremity 12/19/2015  . Viral pharyngitis 05/05/2016  . Fall 06/02/2016  . Left upper quadrant abdominal tenderness 07/07/2016  . Microscopic hematuria 07/14/2016  . Abdominal pain, epigastric 08/04/2016  . Influenza-like illness 10/13/2016  . Missed abortion 05/02/2018   Past Medical History:  Diagnosis Date  . Herniated cervical disc 2016  . Hip pain   . Right tennis elbow   . Thyroid goiter        Review of Systems See HPI.     Objective:   Physical Exam  Constitutional: She is oriented to person, place, and time. She appears well-developed and well-nourished.  HENT:  Head: Normocephalic and atraumatic.  Cardiovascular: Normal rate and regular rhythm.  Pulmonary/Chest: Effort normal and breath sounds normal.  Musculoskeletal:  Decreased ROM at waist due to pain.  Negative straight leg test.  Passive ROM of legs little pain but with active left low back pain that radiates into buttocks.  No lumbar spinal tenderness.  Left lower paraspinal tenderness and tightness.   Neurological: She is alert and oriented to person, place, and time.  Psychiatric: She has a normal mood and affect. Her behavior is normal.          Assessment & Plan:  Marland KitchenMarland KitchenDiagnoses and all orders for this visit:  Lumbar degenerative disc disease -     HYDROcodone-acetaminophen (NORCO/VICODIN) 5-325 MG tablet; Take 1 tablet by mouth every 8 (eight) hours as needed for up to 5 days for moderate pain. -     predniSONE (DELTASONE) 50 MG tablet; One tab  PO daily for 5 days. -     ketorolac (TORADOL) injection 60 mg  Muscle spasm of back -     HYDROcodone-acetaminophen (NORCO/VICODIN) 5-325 MG tablet; Take 1 tablet by mouth every 8 (eight) hours as needed for up to 5 days for moderate pain. -     predniSONE (DELTASONE) 50 MG tablet; One tab PO daily for 5 days. -     ketorolac (TORADOL) injection 60 mg   Pt is in 2nd trimester and not quite in 3rd trimester so  perfect time to have the least risk for toradol injection. Pt is aware of risk. Toradol given today. Prednisone burst given. Continue tylenol but for severe pain norco in small quanity was given as needed to use sparingly. Use lots of heat, biofreeze, stretches and consider massage. Use good lifting techniques. Avoid tens unit in pregnancy until consult with OB. Will avoid muscle relaxer for now. Follow up as needed.

## 2018-11-14 NOTE — Telephone Encounter (Signed)
Pt called stating that she has a herniated disc and could she get a cortisone shot while pregnant.  Spoke with Dr Penne Lash who stated that if it is needed and the benefits outweigh the risks.  It will make her glucoses elevated but she is not doing her GTT for 4 more weeks.  She is going to call Dr Clement Sayres office for an appt.

## 2018-11-14 NOTE — Patient Instructions (Signed)
Low Back Strain Rehab  Ask your health care provider which exercises are safe for you. Do exercises exactly as told by your health care provider and adjust them as directed. It is normal to feel mild stretching, pulling, tightness, or discomfort as you do these exercises, but you should stop right away if you feel sudden pain or your pain gets worse. Do not begin these exercises until told by your health care provider.  Stretching and range of motion exercises  These exercises warm up your muscles and joints and improve the movement and flexibility of your back. These exercises also help to relieve pain, numbness, and tingling.  Exercise A: Single knee to chest    1. Lie on your back on a firm surface with both legs straight.  2. Bend one of your knees. Use your hands to move your knee up toward your chest until you feel a gentle stretch in your lower back and buttock.  ? Hold your leg in this position by holding onto the front of your knee.  ? Keep your other leg as straight as possible.  3. Hold for __________ seconds.  4. Slowly return to the starting position.  5. Repeat with your other leg.  Repeat __________ times. Complete this exercise __________ times a day.  Exercise B: Prone extension on elbows    1. Lie on your abdomen on a firm surface.  2. Prop yourself up on your elbows.  3. Use your arms to help lift your chest up until you feel a gentle stretch in your abdomen and your lower back.  ? This will place some of your body weight on your elbows. If this is uncomfortable, try stacking pillows under your chest.  ? Your hips should stay down, against the surface that you are lying on. Keep your hip and back muscles relaxed.  4. Hold for __________ seconds.  5. Slowly relax your upper body and return to the starting position.  Repeat __________ times. Complete this exercise __________ times a day.  Strengthening exercises  These exercises build strength and endurance in your back. Endurance is the ability to  use your muscles for a long time, even after they get tired.  Exercise C: Pelvic tilt  1. Lie on your back on a firm surface. Bend your knees and keep your feet flat.  2. Tense your abdominal muscles. Tip your pelvis up toward the ceiling and flatten your lower back into the floor.  ? To help with this exercise, you may place a small towel under your lower back and try to push your back into the towel.  3. Hold for __________ seconds.  4. Let your muscles relax completely before you repeat this exercise.  Repeat __________ times. Complete this exercise __________ times a day.  Exercise D: Alternating arm and leg raises    1. Get on your hands and knees on a firm surface. If you are on a hard floor, you may want to use padding to cushion your knees, such as an exercise mat.  2. Line up your arms and legs. Your hands should be below your shoulders, and your knees should be below your hips.  3. Lift your left leg behind you. At the same time, raise your right arm and straighten it in front of you.  ? Do not lift your leg higher than your hip.  ? Do not lift your arm higher than your shoulder.  ? Keep your abdominal and back muscles tight.  ?   Keep your hips facing the ground.  ? Do not arch your back.  ? Keep your balance carefully, and do not hold your breath.  4. Hold for __________ seconds.  5. Slowly return to the starting position and repeat with your right leg and your left arm.  Repeat __________ times. Complete this exercise __________times a day.  Exercise J: Single leg lower with bent knees  1. Lie on your back on a firm surface.  2. Tense your abdominal muscles and lift your feet off the floor, one foot at a time, so your knees and hips are bent in an "L" shape (at about 90 degrees).  ? Your knees should be over your hips and your lower legs should be parallel to the floor.  3. Keeping your abdominal muscles tense and your knee bent, slowly lower one of your legs so your toe touches the ground.  4. Lift your  leg back up to return to the starting position.  ? Do not hold your breath.  ? Do not let your back arch. Keep your back flat against the ground.  5. Repeat with your other leg.  Repeat __________ times. Complete this exercise __________ times a day.  Posture and body mechanics    Body mechanics refers to the movements and positions of your body while you do your daily activities. Posture is part of body mechanics. Good posture and healthy body mechanics can help to relieve stress in your body's tissues and joints. Good posture means that your spine is in its natural S-curve position (your spine is neutral), your shoulders are pulled back slightly, and your head is not tipped forward. The following are general guidelines for applying improved posture and body mechanics to your everyday activities.  Standing     When standing, keep your spine neutral and your feet about hip-width apart. Keep a slight bend in your knees. Your ears, shoulders, and hips should line up.   When you do a task in which you stand in one place for a long time, place one foot up on a stable object that is 2-4 inches (5-10 cm) high, such as a footstool. This helps keep your spine neutral.  Sitting     When sitting, keep your spine neutral and keep your feet flat on the floor. Use a footrest, if necessary, and keep your thighs parallel to the floor. Avoid rounding your shoulders, and avoid tilting your head forward.   When working at a desk or a computer, keep your desk at a height where your hands are slightly lower than your elbows. Slide your chair under your desk so you are close enough to maintain good posture.   When working at a computer, place your monitor at a height where you are looking straight ahead and you do not have to tilt your head forward or downward to look at the screen.  Resting     When lying down and resting, avoid positions that are most painful for you.   If you have pain with activities such as sitting, bending,  stooping, or squatting (flexion-based activities), lie in a position in which your body does not bend very much. For example, avoid curling up on your side with your arms and knees near your chest (fetal position).   If you have pain with activities such as standing for a long time or reaching with your arms (extension-based activities), lie with your spine in a neutral position and bend your knees slightly. Try the   following positions:  ? Lying on your side with a pillow between your knees.  ? Lying on your back with a pillow under your knees.  Lifting     When lifting objects, keep your feet at least shoulder-width apart and tighten your abdominal muscles.   Bend your knees and hips and keep your spine neutral. It is important to lift using the strength of your legs, not your back. Do not lock your knees straight out.   Always ask for help to lift heavy or awkward objects.  This information is not intended to replace advice given to you by your health care provider. Make sure you discuss any questions you have with your health care provider.  Document Released: 11/30/2005 Document Revised: 08/06/2016 Document Reviewed: 09/11/2015  Elsevier Interactive Patient Education  2018 [employer]

## 2018-11-15 ENCOUNTER — Encounter: Payer: Self-pay | Admitting: Physician Assistant

## 2018-11-24 ENCOUNTER — Encounter: Payer: Self-pay | Admitting: Sports Medicine

## 2018-12-14 NOTE — L&D Delivery Note (Signed)
Dawn [name] is a 38 y.o. female G3P1011 with IUP at [redacted]w[redacted]d admitted for SROM.  She progressed without augmentation to complete and pushed 1 hour to deliver.  Cord clamping delayed by several minutes then clamped by CNM and cut by FOB.    Delivery Note At 9:11 PM a viable female was delivered via Vaginal, Spontaneous (Presentation: ROA).  APGAR: 9, 9; weight pending.   Placenta status: spontaneous, intact.  Cord: 3 vessels.  Anesthesia: local lidocaine  Episiotomy: None Lacerations: 2nd degree Suture Repair: 3.0 vicryl rapide Est. Blood Loss (mL):  347  Mom to postpartum.  Baby to Couplet care / Skin to Skin.  Rolm Bookbinder CNM 03/05/2019, 9:42 PM

## 2018-12-16 ENCOUNTER — Ambulatory Visit (INDEPENDENT_AMBULATORY_CARE_PROVIDER_SITE_OTHER): Payer: Federal, State, Local not specified - PPO

## 2018-12-16 VITALS — BP 117/68 | HR 100 | Wt 167.0 lb

## 2018-12-16 DIAGNOSIS — O09899 Supervision of other high risk pregnancies, unspecified trimester: Secondary | ICD-10-CM

## 2018-12-16 DIAGNOSIS — Z3480 Encounter for supervision of other normal pregnancy, unspecified trimester: Secondary | ICD-10-CM

## 2018-12-16 DIAGNOSIS — Z23 Encounter for immunization: Secondary | ICD-10-CM

## 2018-12-16 DIAGNOSIS — Z3483 Encounter for supervision of other normal pregnancy, third trimester: Secondary | ICD-10-CM

## 2018-12-16 DIAGNOSIS — O09893 Supervision of other high risk pregnancies, third trimester: Secondary | ICD-10-CM

## 2018-12-16 NOTE — Patient Instructions (Signed)

## 2018-12-16 NOTE — Progress Notes (Signed)
   PRENATAL VISIT NOTE  Subjective:  Dawn [name] is a 38 y.o. G3P1011 at [redacted]w[redacted]d being seen today for ongoing prenatal care.  She is currently monitored for the following issues for this low-risk pregnancy and has Nontoxic multinodular goiter; Family history of breast cancer; Depression; Myofascial pain syndrome; Goiter, nontoxic, multinodular; Overactive bladder; Varicose vein of leg; Patellofemoral pain syndrome; Lumbar degenerative disc disease; Hyperlipidemia; Benign paroxysmal positional vertigo; Subconjunctival hematoma, left; Muscle spasm of back; Pregnancy; Elevated PTHrP level; AMA (advanced maternal age) multigravida 35+; and Supervision of other high risk pregnancy, antepartum on their problem list.  Patient reports backache. She has a bulging disc and was seen on 12/2 for a cortisone shot. She states it has helped but still having some pain.  Contractions: Not present. Vag. Bleeding: None.  Movement: Present. Denies leaking of fluid.   The following portions of the patient's history were reviewed and updated as appropriate: allergies, current medications, past family history, past medical history, past social history, past surgical history and problem list. Problem list updated.  Objective:   Vitals:   12/16/18 0812  BP: 117/68  Pulse: 100  Weight: 167 lb (75.8 kg)    Fetal Status: Fetal Heart Rate (bpm): 152 Fundal Height: 28 cm Movement: Present     General:  Alert, oriented and cooperative. Patient is in no acute distress.  Skin: Skin is warm and dry. No rash noted.   Cardiovascular: Normal heart rate noted  Respiratory: Normal respiratory effort, no problems with respiration noted  Abdomen: Soft, gravid, appropriate for gestational age.  Pain/Pressure: Absent     Pelvic: Cervical exam deferred        Extremities: Normal range of motion.  Edema: None  Mental Status: Normal mood and affect. Normal behavior. Normal judgment and thought content.   Assessment and Plan:    Pregnancy: G3P1011 at [redacted]w[redacted]d  1. Supervision of other normal pregnancy, antepartum - No complaints. Routine care - Tdap vaccine greater than or equal to 7yo IM - HIV antibody (with reflex) - CBC - RPR - 2Hr GTT w/ 1 Hr Carpenter 75 g - Hepatitis B Surface AntiGEN  Preterm labor symptoms and general obstetric precautions including but not limited to vaginal bleeding, contractions, leaking of fluid and fetal movement were reviewed in detail with the patient. Please refer to After Visit Summary for other counseling recommendations.  Return in about 4 weeks (around 01/13/2019) for Return OB visit.  Future Appointments  Date Time Provider Department Center  01/13/2019  8:15 AM Rolm Bookbinder, CNM [facility]    Rolm Bookbinder, [place] 12/16/18 8:32 AM

## 2018-12-19 LAB — CBC
HCT: 33.9 % — ABNORMAL LOW (ref 35.0–45.0)
Hemoglobin: 11.2 g/dL — ABNORMAL LOW (ref 11.7–15.5)
MCH: 29.2 pg (ref 27.0–33.0)
MCHC: 33 g/dL (ref 32.0–36.0)
MCV: 88.5 fL (ref 80.0–100.0)
MPV: 10.3 fL (ref 7.5–12.5)
Platelets: 187 10*3/uL (ref 140–400)
RBC: 3.83 10*6/uL (ref 3.80–5.10)
RDW: 13.1 % (ref 11.0–15.0)
WBC: 10.1 10*3/uL (ref 3.8–10.8)

## 2018-12-19 LAB — RPR: RPR Ser Ql: NONREACTIVE

## 2018-12-19 LAB — 2HR GTT W 1 HR, CARPENTER, 75 G
GLUCOSE, FASTING, GEST: 73 mg/dL (ref 65–91)
Glucose, 1 Hr, Gest: 120 mg/dL (ref 65–179)
Glucose, 2 Hr, Gest: 112 mg/dL (ref 65–152)

## 2018-12-19 LAB — HIV ANTIBODY (ROUTINE TESTING W REFLEX): HIV: NONREACTIVE

## 2018-12-19 LAB — HEPATITIS B SURFACE ANTIGEN: Hepatitis B Surface Ag: NONREACTIVE

## 2018-12-30 ENCOUNTER — Other Ambulatory Visit: Payer: Self-pay

## 2018-12-30 DIAGNOSIS — K219 Gastro-esophageal reflux disease without esophagitis: Secondary | ICD-10-CM

## 2018-12-30 MED ORDER — PANTOPRAZOLE SODIUM 40 MG PO TBEC: 40.0000 mg | DELAYED_RELEASE_TABLET | Freq: Two times a day (BID) | ORAL | 0 refills | Status: DC

## 2019-01-13 ENCOUNTER — Ambulatory Visit (INDEPENDENT_AMBULATORY_CARE_PROVIDER_SITE_OTHER): Payer: Federal, State, Local not specified - PPO

## 2019-01-13 VITALS — BP 126/70 | HR 111 | Wt 171.0 lb

## 2019-01-13 DIAGNOSIS — Z3483 Encounter for supervision of other normal pregnancy, third trimester: Secondary | ICD-10-CM

## 2019-01-13 DIAGNOSIS — K219 Gastro-esophageal reflux disease without esophagitis: Secondary | ICD-10-CM

## 2019-01-13 DIAGNOSIS — Z3480 Encounter for supervision of other normal pregnancy, unspecified trimester: Secondary | ICD-10-CM

## 2019-01-13 NOTE — Progress Notes (Signed)
   PRENATAL VISIT NOTE  Subjective:  Dawn [name] is a 38 y.o. G3P1011 at [redacted]w[redacted]d being seen today for ongoing prenatal care.  She is currently monitored for the following issues for this low-risk pregnancy and has Nontoxic multinodular goiter; Family history of breast cancer; Depression; Myofascial pain syndrome; Goiter, nontoxic, multinodular; Overactive bladder; Varicose vein of leg; Patellofemoral pain syndrome; Lumbar degenerative disc disease; Hyperlipidemia; Benign paroxysmal positional vertigo; Subconjunctival hematoma, left; Muscle spasm of back; Pregnancy; Elevated PTHrP level; AMA (advanced maternal age) multigravida 35+; and Supervision of other high risk pregnancy, antepartum on their problem list.  Patient reports no complaints.  Contractions: Not present. Vag. Bleeding: None.  Movement: Present. Denies leaking of fluid.   The following portions of the patient's history were reviewed and updated as appropriate: allergies, current medications, past family history, past medical history, past social history, past surgical history and problem list. Problem list updated.  Objective:   Vitals:   01/13/19 0806  BP: 126/70  Pulse: (!) 111  Weight: 171 lb (77.6 kg)    Fetal Status: Fetal Heart Rate (bpm): 148   Movement: Present     General:  Alert, oriented and cooperative. Patient is in no acute distress.  Skin: Skin is warm and dry. No rash noted.   Cardiovascular: Normal heart rate noted  Respiratory: Normal respiratory effort, no problems with respiration noted  Abdomen: Soft, gravid, appropriate for gestational age.  Pain/Pressure: Absent     Pelvic: Cervical exam deferred        Extremities: Normal range of motion.  Edema: None  Mental Status: Normal mood and affect. Normal behavior. Normal judgment and thought content.   Assessment and Plan:  Pregnancy: G3P1011 at [redacted]w[redacted]d  1. Supervision of other normal pregnancy, antepartum Routine prenatal care.  Anticipatory guidance  provided.  Will check GBS status at next visit.  On Babyscripts schedule, will return in 4 weeks.  2. Gastroesophageal reflux disease, esophagitis presence not specified Symptoms improved with Protonix.  Will continue current treatment.  Preterm labor symptoms and general obstetric precautions including but not limited to vaginal bleeding, contractions, leaking of fluid and fetal movement were reviewed in detail with the patient. Please refer to After Visit Summary for other counseling recommendations.  Return in about 4 weeks (around 02/10/2019) for Return OB visit.  Future Appointments  Date Time Provider Department Center  02/10/2019  8:15 AM Rolm Bookbinder, CNM [facility]    Louis Matte. Earlene Plater, SNP

## 2019-01-20 ENCOUNTER — Ambulatory Visit (INDEPENDENT_AMBULATORY_CARE_PROVIDER_SITE_OTHER): Payer: Federal, State, Local not specified - PPO | Admitting: Certified Nurse Midwife

## 2019-01-20 VITALS — BP 111/70 | HR 95 | Wt 171.0 lb

## 2019-01-20 DIAGNOSIS — R000 Tachycardia, unspecified: Secondary | ICD-10-CM

## 2019-01-20 DIAGNOSIS — Z3480 Encounter for supervision of other normal pregnancy, unspecified trimester: Secondary | ICD-10-CM

## 2019-01-20 DIAGNOSIS — Z3483 Encounter for supervision of other normal pregnancy, third trimester: Secondary | ICD-10-CM

## 2019-01-20 LAB — POCT URINALYSIS DIPSTICK OB
Bilirubin, UA: NEGATIVE
Blood, UA: NEGATIVE
Glucose, UA: NEGATIVE
Ketones, UA: NEGATIVE
Leukocytes, UA: NEGATIVE
Nitrite, UA: NEGATIVE
POC,PROTEIN,UA: NEGATIVE
SPEC GRAV UA: 1.015 (ref 1.010–1.025)
UROBILINOGEN UA: 0.2 U/dL
pH, UA: 5 (ref 5.0–8.0)

## 2019-01-20 NOTE — Progress Notes (Signed)
Subjective:  Dawn [name] is a 38 y.o. G3P1011 at [redacted]w[redacted]d being seen today for work-in appointment. She is currently monitored for the following issues for this low-risk pregnancy and has Nontoxic multinodular goiter; Family history of breast cancer; Depression; Myofascial pain syndrome; Goiter, nontoxic, multinodular; Overactive bladder; Varicose vein of leg; Patellofemoral pain syndrome; Lumbar degenerative disc disease; Hyperlipidemia; Benign paroxysmal positional vertigo; Subconjunctival hematoma, left; Muscle spasm of back; Pregnancy; Elevated PTHrP level; AMA (advanced maternal age) multigravida 35+; and Supervision of other high risk pregnancy, antepartum on their problem list.  Patient reports episode of fast heartrate this am. Lasted about 1 hr. Her watch showed 160 bpm. Felt lightheaded at the time. Tried valsalva and it helped. Denies CP or SOB. Contractions: Not present. Vag. Bleeding: None.  Movement: Present. Denies leaking of fluid.   The following portions of the patient's history were reviewed and updated as appropriate: allergies, current medications, past family history, past medical history, past social history, past surgical history and problem list. Problem list updated.  Objective:   Vitals:   01/20/19 0853  BP: 111/70  Pulse: 95  Weight: 77.6 kg   Orthostatic VS: 100/61, 86 lying 99/62, 93 sitting 97/65, 104 standing  Fetal Status: Fetal Heart Rate (bpm): 153 Fundal Height: 33 cm Movement: Present  Presentation: Vertex  General:  Alert, oriented and cooperative. Patient is in no acute distress.  Skin: Skin is warm and dry. No rash noted.   Cardiovascular: RRR  Respiratory: CTAB  Abdomen: Soft, gravid, appropriate for gestational age. Pain/Pressure: Absent     Pelvic: Vag. Bleeding: None Vag D/C Character: Thin   Cervical exam deferred        Extremities: Normal range of motion.  Edema: None  Mental Status: Normal mood and affect. Normal behavior. Normal  judgment and thought content.   Urinalysis:    Results for orders placed or performed in visit on 01/20/19 (from the past 24 hour(s))  POC Urinalysis Dipstick OB     Status: None   Collection Time: 01/20/19  9:45 AM  Result Value Ref Range   Color, UA Light Yellow    Clarity, UA Clear    Glucose, UA Negative Negative   Bilirubin, UA negative    Ketones, UA negative    Spec Grav, UA 1.015 1.010 - 1.025   Blood, UA neg    pH, UA 5.0 5.0 - 8.0   POC,PROTEIN,UA Negative Negative, Trace, Small (1+), Moderate (2+), Large (3+), 4+   Urobilinogen, UA 0.2 0.2 or 1.0 E.U./dL   Nitrite, UA neg    Leukocytes, UA Negative Negative   Appearance     Odor     Assessment and Plan:  Pregnancy: G3P1011 at [redacted]w[redacted]d  1. Supervision of other normal pregnancy, antepartum - POC Urinalysis Dipstick OB  2. Racing heart beat - normal rate and rhythm now, asymptomatic - Ambulatory referral to Cardiology  Preterm labor symptoms and general obstetric precautions including but not limited to vaginal bleeding, contractions, leaking of fluid and fetal movement were reviewed in detail with the patient. Please refer to After Visit Summary for other counseling recommendations.  Return in about 2 weeks (around 02/03/2019).   Donette Larry, CNM

## 2019-01-20 NOTE — Progress Notes (Signed)
BP Lying 100/61 p-86  Sitting 99/62 p-93  Standing 97/65  p-104

## 2019-01-23 NOTE — Progress Notes (Signed)
Cardiology Office Note:    Date:  01/24/2019   ID:  Dawn CopaHeather L Pontius, DOB [birth year], MRN [id number]  PCP:  Monica Bectonhekkekandam, Thomas J, MD  Cardiologist:  Norman HerrlichBrian Justa Hatchell, MD   Referring MD: Donette LarryBhambri, Melanie, CNM  ASSESSMENT:    1. Palpitations   2. Supervision of other high risk pregnancy, antepartum    PLAN:    In order of problems listed above:  1. She is recently had 2 episodes of rapid heart rate in the range of 150 to 160 bpm.  Difficult to determine whether this is atrial tachyarrhythmia or sinus tachycardia exacerbated by pregnancy and asked her to purchase an adapter for her iPhone to capture episodes we will see her back in the office in 4 weeks.  This time I would not put on rate suppressing medication in the absence of documented arrhythmia 2. Stable managed by her OB GYN  Next appointment 4 weeks   Medication Adjustments/Labs and Tests Ordered: Current medicines are reviewed at length with the patient today.  Concerns regarding medicines are outlined above.  Orders Placed This Encounter  Procedures  . EKG 12-Lead   No orders of the defined types were placed in this encounter.    Chief Complaint  Patient presents with  . Palpitations    rapid HR    History of Present Illness:    Dawn [name] is a 38 y.o. female 3733 week pregnantwith a history of mutinodular goiterwho is being seen today for the evaluation of palpitation-rapid HR at the request of Donette LarryBhambri, Melanie, [place].  She has in the past had a couple of episodes of brief rapid heart rhythm but do not diagnosis of arrhythmia.  No underlying congenital rheumatic heart disease last 2 weeks she has had 2 abrupt episodes of rapid heart rate one lasted about an hour and seemed to be improved after she did a Valsalva maneuver the other one slowed with rest and posturing on the left side supine.  No chest pain shortness of breath she has some edema dependent in today no syncope or TIA and no history of congenital or  rheumatic heart disease Past Medical History:  Diagnosis Date  . AMA (advanced maternal age) multigravida 35+ 08/12/2018  . Benign paroxysmal positional vertigo 05/11/2017  . Depression   . Goiter, nontoxic, multinodular 10/19/2008   Overview:  Nontoxic Multinodular Goiter Nml labs, US  . Herniated cervical disc 2016  . Hip pain   . Hyperlipidemia 03/15/2017  . Lumbar degenerative disc disease 05/25/2016  . Muscle spasm of back 12/13/2017  . Myofascial pain syndrome 08/27/2014   negative cervical spine MRI, a negative brain MRI, nerve conduction and electromyography, and normal extensive blood work for peripheral neuropathy and myopathy.   . Nontoxic multinodular goiter 10/19/2008   Overview:  Nontoxic Multinodular Goiter  . Overactive bladder 09/24/2014  . Patellofemoral pain syndrome 06/04/2015  . Right tennis elbow   . Subconjunctival hematoma, left 09/14/2017  . Supervision of other high risk pregnancy, antepartum 08/12/2018    Nursing Staff Provider Office Location  [facility] Dating  10/LMP Language  English Anatomy US   normal Flu Vaccine  08/26/18 Genetic Screen  NIPS:Negative  AFP:  Normal  First Screen:  Quad:   TDaP vaccine   12/16/18 Hgb A1C or  GTT Early  Third trimester  Rhogam  NA   LAB RESULTS  Feeding Plan  Breast Blood Type AB/RH(D) POSITIVE/-- (04/18 [id number])  Contraception  NFP Antibody NO ANTIBODIES DETECTED (04/  . Thyroid goiter  benign  . Varicose vein of leg 06/04/2015    Past Surgical History:  Procedure Laterality Date  . TONSILLECTOMY  1986    Current Medications: No outpatient medications have been marked as taking for the 01/24/19 encounter (Office Visit) with Baldo DaubMunley, Zyan Coby J, MD.     Allergies:   Latex   Social History   Socioeconomic History  . Marital status: Married    Spouse name: Trinna Postlex  . Number of children: 1  . Years of education: 12+  . Highest education level: Not on file  Occupational History  . Occupation: [employer] of [employer]    Comment:  Supervisor  Social Needs  . Financial resource strain: Not hard at all  . Food insecurity:    Worry: Never true    Inability: Never true  . Transportation needs:    Medical: No    Non-medical: No  Tobacco Use  . Smoking status: Never Smoker  . Smokeless tobacco: Never Used  Substance and Sexual Activity  . Alcohol use: Not Currently    Alcohol/week: 0.0 standard drinks  . Drug use: No  . Sexual activity: Yes    Birth control/protection: None    Comment: Mirena - September 2012  Lifestyle  . Physical activity:    Days per week: Not on file    Minutes per session: Not on file  . Stress: Not on file  Relationships  . Social connections:    Talks on phone: Not on file    Gets together: Not on file    Attends religious service: Not on file    Active member of club or organization: Not on file    Attends meetings of clubs or organizations: Not on file    Relationship status: Not on file  Other Topics Concern  . Not on file  Social History Narrative   Patient lives at home with husband and daughter.    Patient is right handed   Patient has one child   Patient has a college degree.    Caffeine: Coffee 3-4 cups/day      Family History: The patient's family history includes Birth defects in her maternal grandfather and maternal grandmother; Breast cancer in her sister; Breast cancer (age of onset: 2742) in her maternal aunt; Breast cancer (age of onset: 2750) in her maternal aunt; Cancer in her maternal grandfather and maternal grandmother; Hyperlipidemia in her father; Hypertension in her sister.  ROS:   Review of Systems  Constitution: Negative.  HENT: Negative.   Eyes: Negative.   Cardiovascular: Positive for leg swelling and palpitations.  Respiratory: Negative.   Endocrine: Negative.   Hematologic/Lymphatic: Negative.   Skin: Negative.   Musculoskeletal: Negative.   Gastrointestinal: Negative.   Genitourinary: Negative.   Neurological: Negative.     Allergic/Immunologic: Negative.    Please see the history of present illness.     All other systems reviewed and are negative.  EKGs/Labs/Other Studies Reviewed:    The following studies were reviewed today:   EKG:  EKG is  ordered today.  The ekg ordered today demonstrates sinus rhythm and is normal  Recent Labs: 03/23/2018: ALT 7; BUN 12; Creat 0.64; Potassium 4.1; Sodium 137; TSH 1.61 12/16/2018: Hemoglobin 11.2; Platelets 187  Recent Lipid Panel    Component Value Date/Time   CHOL 205 (H) 04/08/2018 0742   TRIG 62 04/08/2018 0742   HDL 71 04/08/2018 0742   CHOLHDL 2.9 04/08/2018 0742   VLDL 17 06/02/2016 0822   LDLCALC 119 (H) 04/08/2018  2355    Physical Exam:    VS:  BP 120/68 (BP Location: Right Arm, Patient Position: Sitting, Cuff Size: Normal)   Pulse 88   Ht 5\' 5"  (1.651 m)   Wt 172 lb 12.8 oz (78.4 kg)   LMP 06/03/2018   SpO2 98%   BMI 28.76 kg/m     Wt Readings from Last 3 Encounters:  01/24/19 172 lb 12.8 oz (78.4 kg)  01/20/19 171 lb (77.6 kg)  01/13/19 171 lb (77.6 kg)     GEN:  Well nourished, well developed in no acute distress HEENT: Normal NECK: No JVD; No carotid bruits LYMPHATICS: No lymphadenopathy CARDIAC: RRR, no murmurs, rubs, gallops RESPIRATORY:  Clear to auscultation without rales, wheezing or rhonchi  ABDOMEN: Soft, non-tender, non-distended MUSCULOSKELETAL:  No edema; No deformity  SKIN: Warm and dry NEUROLOGIC:  Alert and oriented x 3 PSYCHIATRIC:  Normal affect     Signed, Norman Herrlich, MD  01/24/2019 4:52 PM    [facility]

## 2019-01-24 ENCOUNTER — Ambulatory Visit (INDEPENDENT_AMBULATORY_CARE_PROVIDER_SITE_OTHER): Payer: Federal, State, Local not specified - PPO | Admitting: Cardiology

## 2019-01-24 ENCOUNTER — Encounter: Payer: Self-pay | Admitting: Cardiology

## 2019-01-24 VITALS — BP 120/68 | HR 88 | Ht 65.0 in | Wt 172.8 lb

## 2019-01-24 DIAGNOSIS — O09899 Supervision of other high risk pregnancies, unspecified trimester: Secondary | ICD-10-CM

## 2019-01-24 DIAGNOSIS — R002 Palpitations: Secondary | ICD-10-CM

## 2019-01-24 DIAGNOSIS — Z331 Pregnant state, incidental: Secondary | ICD-10-CM | POA: Diagnosis not present

## 2019-01-24 NOTE — Patient Instructions (Addendum)
Medication Instructions:  Your physician recommends that you continue on your current medications as directed. Please refer to the Current Medication list given to you today.  If you need a refill on your cardiac medications before your next appointment, please call your pharmacy.   Lab work: None  If you have labs (blood work) drawn today and your tests are completely normal, you will receive your results only by: Marland Kitchen MyChart Message (if you have MyChart) OR . A paper copy in the mail If you have any lab test that is abnormal or we need to change your treatment, we will call you to review the results.  Testing/Procedures: You had an EKG today.   Follow-Up: At [facility], you and your health needs are our priority.  As part of our continuing mission to provide you with exceptional heart care, we have created designated Provider Care Teams.  These Care Teams include your primary Cardiologist (physician) and Advanced Practice Providers (APPs -  Physician Assistants and Nurse Practitioners) who all work together to provide you with the care you need, when you need it. You will need a follow up appointment in 4 weeks.      KardiaMobile [email or web address]        FDA-cleared, clinical grade mobile EKG monitor: Lourena Simmonds is the most clinically-validated mobile EKG used by the world's leading cardiac care medical professionals With Basic service, know instantly if your heart rhythm is normal or if atrial fibrillation is detected, and email the last single EKG recording to yourself or your doctor Premium service, available for purchase through the Kardia app for $9.99 per month or $99 per year, includes unlimited history and storage of your EKG recordings, a monthly EKG summary report to share with your doctor, along with the ability to track your blood pressure, activity and weight Includes one KardiaMobile phone clip FREE SHIPPING: Standard delivery 1-3 business  days. Orders placed by 11:00am PST will ship that afternoon. Otherwise, will ship next business day. All orders ship via [employer] from [place], [place]    KardiaMobile [email or web address]        FDA-cleared, clinical grade mobile EKG monitor: Lourena Simmonds is the most clinically-validated mobile EKG used by the world's leading cardiac care medical professionals With Basic service, know instantly if your heart rhythm is normal or if atrial fibrillation is detected, and email the last single EKG recording to yourself or your doctor Premium service, available for purchase through the Kardia app for $9.99 per month or $99 per year, includes unlimited history and storage of your EKG recordings, a monthly EKG summary report to share with your doctor, along with the ability to track your blood pressure, activity and weight Includes one KardiaMobile phone clip FREE SHIPPING: Standard delivery 1-3 business days. Orders placed by 11:00am PST will ship that afternoon. Otherwise, will ship next business day. All orders ship via [employer] from [place], [place]

## 2019-01-26 ENCOUNTER — Other Ambulatory Visit: Payer: Self-pay

## 2019-01-26 DIAGNOSIS — K219 Gastro-esophageal reflux disease without esophagitis: Secondary | ICD-10-CM

## 2019-02-10 ENCOUNTER — Ambulatory Visit (INDEPENDENT_AMBULATORY_CARE_PROVIDER_SITE_OTHER): Payer: Federal, State, Local not specified - PPO

## 2019-02-10 VITALS — BP 138/70 | HR 106 | Wt 174.0 lb

## 2019-02-10 DIAGNOSIS — Z3483 Encounter for supervision of other normal pregnancy, third trimester: Secondary | ICD-10-CM | POA: Diagnosis not present

## 2019-02-10 DIAGNOSIS — Z3A38 38 weeks gestation of pregnancy: Secondary | ICD-10-CM

## 2019-02-10 DIAGNOSIS — Z113 Encounter for screening for infections with a predominantly sexual mode of transmission: Secondary | ICD-10-CM | POA: Diagnosis not present

## 2019-02-10 DIAGNOSIS — Z3480 Encounter for supervision of other normal pregnancy, unspecified trimester: Secondary | ICD-10-CM

## 2019-02-10 LAB — OB RESULTS CONSOLE GBS: GBS: NEGATIVE

## 2019-02-10 NOTE — Patient Instructions (Signed)

## 2019-02-10 NOTE — Progress Notes (Signed)
   PRENATAL VISIT NOTE  Subjective:  Dawn [name] is a 38 y.o. G3P1011 at [redacted]w[redacted]d being seen today for ongoing prenatal care.  She is currently monitored for the following issues for this low-risk pregnancy and has Nontoxic multinodular goiter; Family history of breast cancer; Myofascial pain syndrome; Goiter, nontoxic, multinodular; Overactive bladder; Varicose vein of leg; Patellofemoral pain syndrome; Lumbar degenerative disc disease; Hyperlipidemia; Benign paroxysmal positional vertigo; Subconjunctival hematoma, left; Muscle spasm of back; Pregnancy; Elevated PTHrP level; AMA (advanced maternal age) multigravida 35+; and Supervision of other high risk pregnancy, antepartum on their problem list.  Patient reports no complaints.  Contractions: Not present. Vag. Bleeding: None.  Movement: Present. Denies leaking of fluid.   The following portions of the patient's history were reviewed and updated as appropriate: allergies, current medications, past family history, past medical history, past social history, past surgical history and problem list. Problem list updated.  Objective:   Vitals:   02/10/19 0811  BP: 138/70  Pulse: (!) 106  Weight: 174 lb (78.9 kg)    Fetal Status: Fetal Heart Rate (bpm): 143 Fundal Height: 36 cm Movement: Present  Presentation: Vertex  General:  Alert, oriented and cooperative. Patient is in no acute distress.  Skin: Skin is warm and dry. No rash noted.   Cardiovascular: Normal heart rate noted  Respiratory: Normal respiratory effort, no problems with respiration noted  Abdomen: Soft, gravid, appropriate for gestational age.  Pain/Pressure: Absent     Pelvic: Cervical exam performed Dilation: Closed Effacement (%): Thick Station: Ballotable  Extremities: Normal range of motion.  Edema: None  Mental Status: Normal mood and affect. Normal behavior. Normal judgment and thought content.   Assessment and Plan:  Pregnancy: G3P1011 at [redacted]w[redacted]d  1. Supervision of  other normal pregnancy, antepartum GBS swab done today.  Routine prenatal care.  Anticipatory guidance given.  On Babyscripts schedule.  F/U in 2 weeks. - Culture, beta strep (group b only) - Cervicovaginal ancillary only( [place]) - CBC - Vitamin D (25 hydroxy)  Preterm labor symptoms and general obstetric precautions including but not limited to vaginal bleeding, contractions, leaking of fluid and fetal movement were reviewed in detail with the patient. Please refer to After Visit Summary for other counseling recommendations.  Return in about 2 weeks (around 02/24/2019) for Return OB visit.  Future Appointments  Date Time Provider Department Center  02/24/2019  8:15 AM Rolm Bookbinder, CNM [facility]  02/28/2019  3:00 PM Baldo Daub, MD [facility] None    Lorina Rabon, RN

## 2019-02-11 ENCOUNTER — Other Ambulatory Visit: Payer: Self-pay

## 2019-02-11 LAB — CBC
HCT: 32.6 % — ABNORMAL LOW (ref 35.0–45.0)
Hemoglobin: 10.2 g/dL — ABNORMAL LOW (ref 11.7–15.5)
MCH: 26.8 pg — ABNORMAL LOW (ref 27.0–33.0)
MCHC: 31.3 g/dL — ABNORMAL LOW (ref 32.0–36.0)
MCV: 85.8 fL (ref 80.0–100.0)
MPV: 11 fL (ref 7.5–12.5)
Platelets: 179 10*3/uL (ref 140–400)
RBC: 3.8 10*6/uL (ref 3.80–5.10)
RDW: 13.1 % (ref 11.0–15.0)
WBC: 9.5 10*3/uL (ref 3.8–10.8)

## 2019-02-11 LAB — VITAMIN D 25 HYDROXY (VIT D DEFICIENCY, FRACTURES): Vit D, 25-Hydroxy: 17 ng/mL — ABNORMAL LOW (ref 30–100)

## 2019-02-11 MED ORDER — VITAMIN D (ERGOCALCIFEROL) 1.25 MG (50000 UNIT) PO CAPS: 50000.0000 [IU] | ORAL_CAPSULE | ORAL | 0 refills | Status: DC

## 2019-02-11 MED ORDER — FERROUS SULFATE 325 (65 FE) MG PO TABS: 325.0000 mg | ORAL_TABLET | Freq: Two times a day (BID) | ORAL | 3 refills | Status: DC

## 2019-02-13 LAB — CULTURE, BETA STREP (GROUP B ONLY)
MICRO NUMBER:: 256537
SPECIMEN QUALITY: ADEQUATE

## 2019-02-13 LAB — CERVICOVAGINAL ANCILLARY ONLY
Chlamydia: NEGATIVE
NEISSERIA GONORRHEA: NEGATIVE

## 2019-02-24 ENCOUNTER — Ambulatory Visit (INDEPENDENT_AMBULATORY_CARE_PROVIDER_SITE_OTHER): Payer: Federal, State, Local not specified - PPO

## 2019-02-24 ENCOUNTER — Other Ambulatory Visit: Payer: Self-pay

## 2019-02-24 VITALS — BP 121/79 | HR 103 | Wt 173.0 lb

## 2019-02-24 DIAGNOSIS — Z3A38 38 weeks gestation of pregnancy: Secondary | ICD-10-CM

## 2019-02-24 DIAGNOSIS — Z3480 Encounter for supervision of other normal pregnancy, unspecified trimester: Secondary | ICD-10-CM

## 2019-02-24 DIAGNOSIS — Z3483 Encounter for supervision of other normal pregnancy, third trimester: Secondary | ICD-10-CM

## 2019-02-24 NOTE — Progress Notes (Signed)
   PRENATAL VISIT NOTE  Subjective:  Dawn [name] is a 38 y.o. G3P1011 at [redacted]w[redacted]d being seen today for ongoing prenatal care.  She is currently monitored for the following issues for this low-risk pregnancy and has Nontoxic multinodular goiter; Family history of breast cancer; Myofascial pain syndrome; Goiter, nontoxic, multinodular; Overactive bladder; Varicose vein of leg; Patellofemoral pain syndrome; Lumbar degenerative disc disease; Hyperlipidemia; Benign paroxysmal positional vertigo; Subconjunctival hematoma, left; Muscle spasm of back; Pregnancy; Elevated PTHrP level; AMA (advanced maternal age) multigravida 35+; and Supervision of other high risk pregnancy, antepartum on their problem list.  Patient reports swelling under armpits. This started in early pregnancy and seems to be getting worse.  Contractions: Not present. Vag. Bleeding: None.  Movement: Present. Denies leaking of fluid.   The following portions of the patient's history were reviewed and updated as appropriate: allergies, current medications, past family history, past medical history, past social history, past surgical history and problem list.   Objective:   Vitals:   02/24/19 0809  BP: 121/79  Pulse: (!) 103  Weight: 173 lb (78.5 kg)    Fetal Status: Fetal Heart Rate (bpm): 153 Fundal Height: 38 cm Movement: Present     General:  Alert, oriented and cooperative. Patient is in no acute distress.  Skin: Skin is warm and dry. No rash noted.   Cardiovascular: Normal heart rate noted  Respiratory: Normal respiratory effort, no problems with respiration noted  Abdomen: Soft, gravid, appropriate for gestational age.  Pain/Pressure: Absent     Pelvic: Cervical exam performed Dilation: 1 Effacement (%): Thick Station: Ballotable  Extremities: Normal range of motion.  Edema: None  Mental Status: Normal mood and affect. Normal behavior. Normal judgment and thought content.   Assessment and Plan:  Pregnancy: G3P1011 at  [redacted]w[redacted]d 1. Supervision of other normal pregnancy, antepartum -Armpit swelling stable. Appears to possibly be mammary tissue. Discussed warning signs of when to seek care. -Questions regarding COVID-19 answered and visitor policy at this time reviewed.   Term labor symptoms and general obstetric precautions including but not limited to vaginal bleeding, contractions, leaking of fluid and fetal movement were reviewed in detail with the patient. Please refer to After Visit Summary for other counseling recommendations.   Return in about 1 week (around 03/03/2019) for Return OB visit.  Future Appointments  Date Time Provider Department Center  02/28/2019  3:00 PM Baldo Daub, MD [facility] None    Rolm Bookbinder, [place] 02/24/19 8:31 AM

## 2019-02-24 NOTE — Progress Notes (Signed)
PT c/o swelling under armpits

## 2019-02-24 NOTE — Patient Instructions (Signed)

## 2019-02-28 ENCOUNTER — Ambulatory Visit: Payer: Federal, State, Local not specified - PPO | Admitting: Cardiology

## 2019-03-02 ENCOUNTER — Ambulatory Visit (INDEPENDENT_AMBULATORY_CARE_PROVIDER_SITE_OTHER): Payer: Federal, State, Local not specified - PPO | Admitting: Obstetrics & Gynecology

## 2019-03-02 ENCOUNTER — Other Ambulatory Visit: Payer: Self-pay

## 2019-03-02 VITALS — BP 123/77 | HR 92 | Wt 174.0 lb

## 2019-03-02 DIAGNOSIS — Z3A38 38 weeks gestation of pregnancy: Secondary | ICD-10-CM

## 2019-03-02 DIAGNOSIS — O09523 Supervision of elderly multigravida, third trimester: Secondary | ICD-10-CM

## 2019-03-02 DIAGNOSIS — O09899 Supervision of other high risk pregnancies, unspecified trimester: Secondary | ICD-10-CM

## 2019-03-02 NOTE — Progress Notes (Signed)
   PRENATAL VISIT NOTE  Subjective:  Dawn [name] is a 37 y.o. G3P1011 at [redacted]w[redacted]d being seen today for ongoing prenatal care.  She is currently monitored for the following issues for this low-risk pregnancy and has Nontoxic multinodular goiter; Family history of breast cancer; Myofascial pain syndrome; Goiter, nontoxic, multinodular; Overactive bladder; Varicose vein of leg; Patellofemoral pain syndrome; Lumbar degenerative disc disease; Hyperlipidemia; Benign paroxysmal positional vertigo; Subconjunctival hematoma, left; Muscle spasm of back; Pregnancy; Elevated PTHrP level; AMA (advanced maternal age) multigravida 35+; and Supervision of other high risk pregnancy, antepartum on their problem list.  Patient reports no complaints.  Contractions: Not present. Vag. Bleeding: None.  Movement: Present. Denies leaking of fluid.   The following portions of the patient's history were reviewed and updated as appropriate: allergies, current medications, past family history, past medical history, past social history, past surgical history and problem list.   Objective:   Vitals:   03/02/19 1621  BP: 123/77  Pulse: 92  Weight: 174 lb (78.9 kg)    Fetal Status: Fetal Heart Rate (bpm): 154   Movement: Present     General:  Alert, oriented and cooperative. Patient is in no acute distress.  Skin: Skin is warm and dry. No rash noted.   Cardiovascular: Normal heart rate noted  Respiratory: Normal respiratory effort, no problems with respiration noted  Abdomen: Soft, gravid, appropriate for gestational age.  Pain/Pressure: Absent     Pelvic: Cervical exam performed        Extremities: Normal range of motion.  Edema: Trace  Mental Status: Normal mood and affect. Normal behavior. Normal judgment and thought content.   Assessment and Plan:  Pregnancy: G3P1011 at [redacted]w[redacted]d 1. Multigravida of advanced maternal age in third trimester   2. Supervision of other high risk pregnancy, antepartum   Term labor  symptoms and general obstetric precautions including but not limited to vaginal bleeding, contractions, leaking of fluid and fetal movement were reviewed in detail with the patient. Please refer to After Visit Summary for other counseling recommendations.   No follow-ups on file.  Future Appointments  Date Time Provider Department Center  04/10/2019 10:00 AM Dulce Sellar, Iline Oven, MD [facility] None    Allie Bossier, MD

## 2019-03-05 ENCOUNTER — Encounter (HOSPITAL_COMMUNITY): Payer: Self-pay

## 2019-03-05 ENCOUNTER — Inpatient Hospital Stay (HOSPITAL_COMMUNITY)
Admission: AD | Admit: 2019-03-05 | Discharge: 2019-03-05 | Disposition: A | Discharge status: Home / Self Care | Payer: Federal, State, Local not specified - PPO | Source: Home / Self Care | Attending: Obstetrics and Gynecology | Admitting: Obstetrics and Gynecology

## 2019-03-05 ENCOUNTER — Other Ambulatory Visit: Payer: Self-pay

## 2019-03-05 ENCOUNTER — Inpatient Hospital Stay (HOSPITAL_COMMUNITY)
Admission: AD | Admit: 2019-03-05 | Discharge: 2019-03-07 | DRG: 807 | Disposition: A | Discharge status: Home / Self Care | Payer: Federal, State, Local not specified - PPO | Attending: Family Medicine | Admitting: Family Medicine

## 2019-03-05 DIAGNOSIS — Z3A39 39 weeks gestation of pregnancy: Secondary | ICD-10-CM

## 2019-03-05 DIAGNOSIS — Z0371 Encounter for suspected problem with amniotic cavity and membrane ruled out: Secondary | ICD-10-CM

## 2019-03-05 DIAGNOSIS — D649 Anemia, unspecified: Secondary | ICD-10-CM | POA: Diagnosis present

## 2019-03-05 DIAGNOSIS — O9902 Anemia complicating childbirth: Secondary | ICD-10-CM | POA: Diagnosis not present

## 2019-03-05 DIAGNOSIS — Z3689 Encounter for other specified antenatal screening: Secondary | ICD-10-CM

## 2019-03-05 DIAGNOSIS — O26893 Other specified pregnancy related conditions, third trimester: Secondary | ICD-10-CM | POA: Diagnosis not present

## 2019-03-05 HISTORY — DX: Anemia, unspecified: D64.9

## 2019-03-05 LAB — POCT FERN TEST
POCT FERN TEST: POSITIVE
POCT Fern Test: NEGATIVE
POCT Fern Test: NEGATIVE

## 2019-03-05 LAB — TYPE AND SCREEN
ABO/RH(D): AB POS
Antibody Screen: NEGATIVE

## 2019-03-05 LAB — CBC
HCT: 35.9 % — ABNORMAL LOW (ref 36.0–46.0)
Hemoglobin: 11.2 g/dL — ABNORMAL LOW (ref 12.0–15.0)
MCH: 27.6 pg (ref 26.0–34.0)
MCHC: 31.2 g/dL (ref 30.0–36.0)
MCV: 88.4 fL (ref 80.0–100.0)
Platelets: 165 10*3/uL (ref 150–400)
RBC: 4.06 MIL/uL (ref 3.87–5.11)
RDW: 17.1 % — ABNORMAL HIGH (ref 11.5–15.5)
WBC: 10.7 10*3/uL — ABNORMAL HIGH (ref 4.0–10.5)
nRBC: 0 % (ref 0.0–0.2)

## 2019-03-05 LAB — ABO/RH: ABO/RH(D): AB POS

## 2019-03-05 MED ORDER — FLEET ENEMA 7-19 GM/118ML RE ENEM: 1.0000 | ENEMA | RECTAL | Status: DC | PRN

## 2019-03-05 MED ORDER — ACETAMINOPHEN 325 MG PO TABS
650.0000 mg | ORAL_TABLET | ORAL | Status: DC | PRN
  Administered 2019-03-06: 650 mg via ORAL
  Filled 2019-03-05: qty 2

## 2019-03-05 MED ORDER — SIMETHICONE 80 MG PO CHEW
80.0000 mg | CHEWABLE_TABLET | ORAL | Status: DC | PRN
  Administered 2019-03-07: 80 mg via ORAL
  Filled 2019-03-05: qty 1

## 2019-03-05 MED ORDER — ONDANSETRON HCL 4 MG/2ML IJ SOLN: 4.0000 mg | INTRAMUSCULAR | Status: DC | PRN

## 2019-03-05 MED ORDER — SOD CITRATE-CITRIC ACID 500-334 MG/5ML PO SOLN: 30.0000 mL | ORAL | Status: DC | PRN

## 2019-03-05 MED ORDER — OXYTOCIN BOLUS FROM INFUSION
500.0000 mL | Freq: Once | INTRAVENOUS | Status: AC
  Administered 2019-03-05: 500 mL via INTRAVENOUS

## 2019-03-05 MED ORDER — TETANUS-DIPHTH-ACELL PERTUSSIS 5-2.5-18.5 LF-MCG/0.5 IM SUSP: 0.5000 mL | Freq: Once | INTRAMUSCULAR | Status: DC

## 2019-03-05 MED ORDER — WITCH HAZEL-GLYCERIN EX PADS: 1.0000 "application " | MEDICATED_PAD | CUTANEOUS | Status: DC | PRN

## 2019-03-05 MED ORDER — PRENATAL MULTIVITAMIN CH
1.0000 | ORAL_TABLET | Freq: Every day | ORAL | Status: DC
  Administered 2019-03-06 – 2019-03-07 (×2): 1 via ORAL
  Filled 2019-03-05 (×2): qty 1

## 2019-03-05 MED ORDER — DIPHENHYDRAMINE HCL 25 MG PO CAPS: 25.0000 mg | ORAL_CAPSULE | Freq: Four times a day (QID) | ORAL | Status: DC | PRN

## 2019-03-05 MED ORDER — OXYCODONE-ACETAMINOPHEN 5-325 MG PO TABS: 2.0000 | ORAL_TABLET | ORAL | Status: DC | PRN

## 2019-03-05 MED ORDER — COCONUT OIL OIL
1.0000 "application " | TOPICAL_OIL | Status: DC | PRN
  Administered 2019-03-06: 1 via TOPICAL

## 2019-03-05 MED ORDER — IBUPROFEN 600 MG PO TABS
600.0000 mg | ORAL_TABLET | Freq: Four times a day (QID) | ORAL | Status: DC
  Administered 2019-03-05 – 2019-03-07 (×7): 600 mg via ORAL
  Filled 2019-03-05 (×7): qty 1

## 2019-03-05 MED ORDER — OXYCODONE-ACETAMINOPHEN 5-325 MG PO TABS
1.0000 | ORAL_TABLET | ORAL | Status: DC | PRN
  Administered 2019-03-05: 1 via ORAL
  Filled 2019-03-05: qty 1

## 2019-03-05 MED ORDER — ONDANSETRON HCL 4 MG PO TABS: 4.0000 mg | ORAL_TABLET | ORAL | Status: DC | PRN

## 2019-03-05 MED ORDER — LIDOCAINE HCL (PF) 1 % IJ SOLN
30.0000 mL | INTRAMUSCULAR | Status: AC | PRN
  Administered 2019-03-05: 30 mL via SUBCUTANEOUS
  Filled 2019-03-05: qty 30

## 2019-03-05 MED ORDER — LACTATED RINGERS IV SOLN: 500.0000 mL | INTRAVENOUS | Status: DC | PRN

## 2019-03-05 MED ORDER — FENTANYL CITRATE (PF) 100 MCG/2ML IJ SOLN
100.0000 ug | INTRAMUSCULAR | Status: DC | PRN
  Administered 2019-03-05: 100 ug via INTRAVENOUS
  Filled 2019-03-05: qty 2

## 2019-03-05 MED ORDER — ONDANSETRON HCL 4 MG/2ML IJ SOLN
4.0000 mg | Freq: Four times a day (QID) | INTRAMUSCULAR | Status: DC | PRN
  Administered 2019-03-05: 4 mg via INTRAVENOUS
  Filled 2019-03-05: qty 2

## 2019-03-05 MED ORDER — LACTATED RINGERS IV SOLN
INTRAVENOUS | Status: DC
  Administered 2019-03-05: 16:00:00 via INTRAVENOUS

## 2019-03-05 MED ORDER — OXYTOCIN 40 UNITS IN NORMAL SALINE INFUSION - SIMPLE MED
2.5000 [IU]/h | INTRAVENOUS | Status: DC
  Filled 2019-03-05: qty 1000

## 2019-03-05 MED ORDER — ACETAMINOPHEN 325 MG PO TABS: 650.0000 mg | ORAL_TABLET | ORAL | Status: DC | PRN

## 2019-03-05 MED ORDER — SENNOSIDES-DOCUSATE SODIUM 8.6-50 MG PO TABS
2.0000 | ORAL_TABLET | ORAL | Status: DC
  Administered 2019-03-05 – 2019-03-06 (×2): 2 via ORAL
  Filled 2019-03-05 (×2): qty 2

## 2019-03-05 MED ORDER — DIBUCAINE 1 % RE OINT: 1.0000 "application " | TOPICAL_OINTMENT | RECTAL | Status: DC | PRN

## 2019-03-05 MED ORDER — ZOLPIDEM TARTRATE 5 MG PO TABS: 5.0000 mg | ORAL_TABLET | Freq: Every evening | ORAL | Status: DC | PRN

## 2019-03-05 MED ORDER — BENZOCAINE-MENTHOL 20-0.5 % EX AERO
1.0000 "application " | INHALATION_SPRAY | CUTANEOUS | Status: DC | PRN
  Administered 2019-03-05 – 2019-03-07 (×2): 1 via TOPICAL
  Filled 2019-03-05 (×2): qty 56

## 2019-03-05 NOTE — Discharge Summary (Signed)
Postpartum Discharge Summary     Patient Name: Dawn [name] DOB: [birth year] MRN: [id number]  Date of admission: 03/05/2019 Delivering Provider: Rolm Bookbinder   Date of discharge: 03/07/2019  Admitting diagnosis: CTX 3 to Intrauterine pregnancy: [redacted]w[redacted]d     Secondary diagnosis:  Active Problems:   Normal labor  Additional problems: None     Discharge diagnosis: Term Pregnancy Delivered                                                                                                Post partum procedures:None  Augmentation: n/a  Complications: None  Hospital course:  Onset of Labor With Vaginal Delivery     38 y.o. yo G3P1011 at [redacted]w[redacted]d was admitted in Latent Labor on 03/05/2019. Patient had an uncomplicated labor course as follows:  Membrane Rupture Time/Date: 3:00 AM ,03/05/2019   Intrapartum Procedures: Episiotomy: None [1]                                         Lacerations:  2nd degree [3]  Patient had a delivery of a Viable infant. 03/05/2019  Information for the patient's newborn:  Aleahya, Reitmeier [[id number]]       Pateint had an uncomplicated postpartum course.  She is ambulating, tolerating a regular diet, passing flatus, and urinating well. Patient is discharged home in stable condition on 03/07/19.   Magnesium Sulfate recieved: No BMZ received: No  Physical exam  Vitals:   03/06/19 0815 03/06/19 1233 03/06/19 2200 03/07/19 0527  BP: 99/66 110/69 111/70 108/71  Pulse: 82 92 86 79  Resp: 17 16 18    Temp: 98.5 F (36.9 C) 97.6 F (36.4 C) 98.4 F (36.9 C) 97.9 F (36.6 C)  TempSrc: Axillary  Oral Oral  SpO2:   98% 97%  Weight:      Height:       General: alert, cooperative and no distress Lochia: appropriate Uterine Fundus: firm Incision: N/A DVT Evaluation: No evidence of DVT seen on physical exam. Labs: Lab Results  Component Value Date   WBC 16.3 (H) 03/06/2019   HGB 9.9 (L) 03/06/2019   HCT 32.4 (L) 03/06/2019   MCV 89.0  03/06/2019   PLT 169 03/06/2019   CMP Latest Ref Rng & Units 03/31/2018  Glucose 65 - 139 mg/dL -  BUN 7 - 25 mg/dL -  Creatinine 6.21 - 3.08 mg/dL -  Sodium 657 - 846 mmol/L -  Potassium 3.5 - 5.3 mmol/L -  Chloride 98 - 110 mmol/L -  CO2 20 - 32 mmol/L -  Calcium 8.6 - 10.2 mg/dL 9.4  Total Protein 6.1 - 8.1 g/dL -  Total Bilirubin 0.2 - 1.2 mg/dL -  Alkaline Phos 33 - 962 U/L -  AST 10 - 30 U/L -  ALT 6 - 29 U/L -    Discharge instruction: per After Visit Summary and "Baby and Me Booklet".  After visit meds:  Allergies as of 03/07/2019      Reactions  Latex Other (See Comments)   Reaction: slight irritation      Medication List    TAKE these medications   acetaminophen 325 MG tablet Commonly known as:  TYLENOL Take 650 mg by mouth every 6 (six) hours as needed for moderate pain. What changed:  Another medication with the same name was added. Make sure you understand how and when to take each.   acetaminophen 325 MG tablet Commonly known as:  Tylenol Take 2 tablets (650 mg total) by mouth every 4 (four) hours as needed (for pain scale < 4). What changed:  You were already taking a medication with the same name, and this prescription was added. Make sure you understand how and when to take each.   Concept DHA 53.5-38-1 MG Caps Take 1 tablet by mouth daily.   ferrous sulfate 325 (65 FE) MG tablet Take 1 tablet (325 mg total) by mouth 2 (two) times daily with a meal.   ibuprofen 800 MG tablet Commonly known as:  ADVIL,MOTRIN Take 1 tablet (800 mg total) by mouth every 8 (eight) hours as needed.   pantoprazole 40 MG tablet Commonly known as:  Protonix Take 1 tablet (40 mg total) by mouth 2 (two) times daily.   prenatal multivitamin Tabs tablet Take 1 tablet by mouth daily at 12 noon.   simethicone 80 MG chewable tablet Commonly known as:  MYLICON Chew 1 tablet (80 mg total) by mouth as needed for flatulence.   Vitamin D (Ergocalciferol) 1.25 MG (50000 UT)  Caps capsule Commonly known as:  DRISDOL Take 1 capsule (50,000 Units total) by mouth every 7 (seven) days. What changed:  additional instructions   witch hazel-glycerin pad Commonly known as:  TUCKS Apply 1 application topically as needed for hemorrhoids.       Diet: routine diet  Activity: Advance as tolerated. Pelvic rest for 6 weeks.   Outpatient follow up:6 weeks Follow up Appt: Future Appointments  Date Time Provider Department Center  04/10/2019 10:00 AM Baldo Daub, MD [facility] None  04/18/2019  1:15 PM Leftwich-Kirby, Wilmer Floor, CNM [facility]   Follow up Visit: Follow-up Information    Center for [facility] [place]. Schedule an appointment as soon as possible for a visit in 6 week(s).   Specialty:  Obstetrics and Gynecology Why:  Please call clinic to make an appointment for your postpartum exam in about six weeks. Contact information: [address], [address] [place] [address] [phone]          Please schedule this patient for PP visit in: 6 weeks Low risk pregnancy complicated by: n/a Delivery mode:  SVD Anticipated Birth Control:  other/unsure PP Procedures needed: n/a  Schedule Integrated BH visit: no Provider: Any provider   Newborn Data: Live born female  Birth Weight:   APGAR: 9, 9  Newborn Delivery   Birth date/time:  03/05/2019 21:11:00 Delivery type:  Vaginal, Spontaneous     Baby Feeding: Breast Disposition:home with mother   Clayton Bibles, [place] 03/07/19  12:18 PM

## 2019-03-05 NOTE — MAU Note (Signed)
Pt had lots of watery discharge during cervical exam, fern slide collected

## 2019-03-05 NOTE — Discharge Instructions (Signed)

## 2019-03-05 NOTE — MAU Note (Signed)
Pt states she felt a gush of fluid at approx 0300 & has continued to leak since then; pink tinged in color.  Denies UCs.  States +FM.

## 2019-03-05 NOTE — MAU Provider Note (Signed)
S: Ms. Dawn [name] is a 38 y.o. G3P1011 at [redacted]w[redacted]d  who presents to MAU today of leaking of fluid since about 0300 hours today. She endorses continuous leakage of pink-tinged fluid since that time . She reports occasional mild abdominal contractions.  She reports normal fetal movement.    O: BP 116/72 (BP Location: Right Arm)   Pulse 89   Resp 12   Ht 5\' 5"  (1.651 m)   Wt 79.8 kg   LMP 06/03/2018   BMI 29.27 kg/m  GENERAL: Well-developed, well-nourished female in no acute distress.  HEAD: Normocephalic, atraumatic.  CHEST: Normal effort of breathing, regular heart rate ABDOMEN: Soft, nontender, gravid  Cervical exam:  Dilation: 1.5 Effacement (%): Thick Exam by:: Knox Saliva, CNM   Fetal Monitoring: Baseline: 135 Variability: moderate Accelerations: positive 15 x 15 Decelerations: none Contractions: Irregular q 4-7 minutes   A: SIUP at [redacted]w[redacted]d  Negative pooling Negative fern Cervix unchanged from clinic Thursday 03/02/19 Reactive tracing Normotensive  P: Discharge home in stable condition  F/U: [facility] 03/10/2019  Calvert Cantor, CNM 03/05/2019 6:06 AM

## 2019-03-05 NOTE — H&P (Addendum)
OBSTETRIC ADMISSION HISTORY AND PHYSICAL  Dawn [name] is a 38 y.o. female G3P1011 with IUP at [redacted]w[redacted]d by LMP presenting for contractions and leakage of fluid. Reports fetal movement. Denies vaginal bleeding. Has been having fluid leakage since this morning, initially fern negative in the MAU, went home, contractions became stronger and more frequent, fluid then fern positive this afternoon. Denies any medical problems this pregnancy other than anemia. Only meds were a PNV and iron supplement.   She received her prenatal care at [facility].  Support person in labor: husband  Ultrasounds . Anatomy U/S: normal  Prenatal History/Complications: . Anemia, on iron supplement . AMA  Past Medical History: Past Medical History:  Diagnosis Date  . AMA (advanced maternal age) multigravida 35+ 08/12/2018  . Anemia    with pregnancy  . Benign paroxysmal positional vertigo 05/11/2017  . Depression    does not currently take meds  . Goiter, nontoxic, multinodular 10/19/2008   Overview:  Nontoxic Multinodular Goiter Nml labs, [place]  . Herniated cervical disc 2016  . Hip pain   . Hyperlipidemia 03/15/2017  . Lumbar degenerative disc disease 05/25/2016  . Muscle spasm of back 12/13/2017  . Myofascial pain syndrome 08/27/2014   negative cervical spine MRI, a negative brain MRI, nerve conduction and electromyography, and normal extensive blood work for peripheral neuropathy and myopathy.   . Nontoxic multinodular goiter 10/19/2008   Overview:  Nontoxic Multinodular Goiter  . Overactive bladder 09/24/2014  . Patellofemoral pain syndrome 06/04/2015  . Right tennis elbow   . Subconjunctival hematoma, left 09/14/2017  . Supervision of other high risk pregnancy, antepartum 08/12/2018    Nursing Staff Provider Office Location  [facility] Dating  10/LMP Language  English Anatomy US   normal Flu Vaccine  08/26/18 Genetic Screen  NIPS:Negative  AFP:  Normal  First Screen:  Quad:   TDaP vaccine   12/16/18 Hgb A1C or   GTT Early  Third trimester  Rhogam  NA   LAB RESULTS  Feeding Plan  Breast Blood Type AB/RH(D) POSITIVE/-- (04/18 5003)  Contraception  NFP Antibody NO ANTIBODIES DETECTED (04/  . Thyroid goiter    benign  . Varicose vein of leg 06/04/2015   Past Surgical History: Past Surgical History:  Procedure Laterality Date  . TONSILLECTOMY  1986   Obstetrical History: OB History    Gravida  3   Para  1   Term  1   Preterm      AB  1   Living  1     SAB  1   TAB      Ectopic      Multiple      Live Births  1          Social History: Social History   Socioeconomic History  . Marital status: Married    Spouse name: Trinna Post  . Number of children: 1  . Years of education: 12+  . Highest education level: Not on file  Occupational History  . 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 Financial resource strain: Not hard at all  . Food insecurity:    Worry: Never true    Inability: Never true  . Transportation needs:    Medical: No    Non-medical: No  Tobacco Use  . Smoking status: Never Smoker  . Smokeless tobacco: Never Used  Substance and Sexual Activity  . Alcohol use: Not Currently    Alcohol/week: 0.0 standard drinks  .  Drug use: No  . Sexual activity: Yes    Birth control/protection: None    Comment: Mirena - September 2012  Lifestyle  . Physical activity:    Days per week: Not on file    Minutes per session: Not on file  . Stress: Not on file  Relationships  . Social connections:    Talks on phone: Not on file    Gets together: Not on file    Attends religious service: Not on file    Active member of club or organization: Not on file    Attends meetings of clubs or organizations: Not on file    Relationship status: Not on file  Other Topics Concern  . Not on file  Social History Narrative   Patient lives at home with husband and daughter.    Patient is right handed   Patient has one child   Patient has a  college degree.    Caffeine: Coffee 3-4 cups/day    Family History: Family History  Problem Relation Age of Onset  . Hyperlipidemia Father   . Birth defects Maternal Grandmother   . Cancer Maternal Grandmother        Ovarian  . Birth defects Maternal Grandfather   . Cancer Maternal Grandfather   . Breast cancer Maternal Aunt 42  . Breast cancer Maternal Aunt 50  . Hypertension Sister   . Breast cancer Sister    Allergies: Allergies  Allergen Reactions  . Latex Other (See Comments)    Reaction: slight irritation   Medications Prior to Admission  Medication Sig Dispense Refill Last Dose  . acetaminophen (TYLENOL) 325 MG tablet Take 650 mg by mouth every 6 (six) hours as needed for moderate pain.   Past Month at Unknown time  . ferrous sulfate 325 (65 FE) MG tablet Take 1 tablet (325 mg total) by mouth 2 (two) times daily with a meal. 60 tablet 3 03/05/2019 at Unknown time  . pantoprazole (PROTONIX) 40 MG tablet Take 1 tablet (40 mg total) by mouth 2 (two) times daily. 30 tablet 0 03/05/2019 at Unknown time  . Prenat-FeFum-FePo-FA-Omega 3 (CONCEPT DHA) 53.5-38-1 MG CAPS Take 1 tablet by mouth daily. 30 capsule 12 03/05/2019 at Unknown time  . Vitamin D, Ergocalciferol, (DRISDOL) 1.25 MG (50000 UT) CAPS capsule Take 1 capsule (50,000 Units total) by mouth every 7 (seven) days. (Patient taking differently: Take 50,000 Units by mouth every 7 (seven) days. Monday) 10 capsule 0 Past Week at Unknown time   Review of Systems  All systems reviewed and negative except as stated in HPI  Blood pressure 103/61, pulse 97, temperature 98.6 F (37 C), temperature source Oral, resp. rate 20, height 5\' 5"  (1.651 m), weight 79.4 kg, last menstrual period 06/03/2018, SpO2 100 %, unknown if currently breastfeeding. General appearance: alert, cooperative and mild distress during contractions Lungs: no respiratory distress Heart: regular rate  Abdomen: soft, non-tender; gravid b Extremities: trace lower  extremity edema Fetal monitoring: baseline 130bpm, +accels, no decels,  Uterine activity: q2-64min Dilation: 6.5 Effacement (%): 90 Station: 0 Exam by:: Leta Jungling RN  Prenatal labs: ABO, Rh: --/--/AB POS, AB POS Performed at [facility], [address], [place], [place] [address]  [phone]) Antibody: NEG (03/22 1615) Rubella: 19.80 (04/18 0916) RPR: NON-REACTIVE (01/03 0834)  HBsAg: NON-REACTIVE (01/03 0834)  HIV: NON-REACTIVE (01/03 9643)  GBS: Negative (02/28 0000)  Glucola: normal Genetic screening:  Low risk NIPS, normal AFP  Prenatal Transfer Tool  Maternal Diabetes: No  Genetic Screening: Normal Maternal Ultrasounds/Referrals: Normal Fetal Ultrasounds or other Referrals:  None Maternal Substance Abuse:  No Significant Maternal Medications:  None Significant Maternal Lab Results: None  Results for orders placed or performed during the hospital encounter of 03/05/19 (from the past 24 hour(s))  POCT fern test   Collection Time: 03/05/19  4:06 PM  Result Value Ref Range   POCT Fern Test Positive = ruptured amniotic membanes   Type and screen [facility]   Collection Time: 03/05/19  4:15 PM  Result Value Ref Range   ABO/RH(D) AB POS    Antibody Screen NEG    Sample Expiration      03/08/2019 Performed at [facility], [address] [place], [place] [address]   ABO/Rh   Collection Time: 03/05/19  4:15 PM  Result Value Ref Range   ABO/RH(D)      AB POS Performed at [facility], [address] [place], [place] [address]   CBC   Collection Time: 03/05/19  4:18 PM  Result Value Ref Range   WBC 10.7 (H) 4.0 - 10.5 K/uL   RBC 4.06 3.87 - 5.11 MIL/uL   Hemoglobin 11.2 (L) 12.0 - 15.0 g/dL   HCT 44.0 (L) 10.2 - 72.5 %   MCV 88.4 80.0 - 100.0 fL   MCH 27.6 26.0 - 34.0 pg   MCHC 31.2 30.0 - 36.0 g/dL   RDW 36.6 (H) 44.0 - 34.7 %   Platelets 165 150 - 400 K/uL   nRBC 0.0 0.0 - 0.2 %  Results for orders placed or performed  during the hospital encounter of 03/05/19 (from the past 24 hour(s))  POCT fern test   Collection Time: 03/05/19  5:13 AM  Result Value Ref Range   POCT Fern Test Negative = intact amniotic membranes   Fern Test   Collection Time: 03/05/19  5:33 AM  Result Value Ref Range   POCT Fern Test Negative = intact amniotic membranes     Patient Active Problem List   Diagnosis Date Noted  . Normal labor 03/05/2019  . AMA (advanced maternal age) multigravida 35+ 08/12/2018  . Supervision of other high risk pregnancy, antepartum 08/12/2018  . Pregnancy 03/31/2018  . Elevated PTHrP level 03/31/2018  . Muscle spasm of back 12/13/2017  . Subconjunctival hematoma, left 09/14/2017  . Benign paroxysmal positional vertigo 05/11/2017  . Hyperlipidemia 03/15/2017  . Lumbar degenerative disc disease 05/25/2016  . Varicose vein of leg 06/04/2015  . Patellofemoral pain syndrome 06/04/2015  . Overactive bladder 09/24/2014  . Myofascial pain syndrome 08/27/2014  . Family history of breast cancer 09/12/2013  . Nontoxic multinodular goiter 10/19/2008  . Goiter, nontoxic, multinodular 10/19/2008    Assessment/Plan:  Dawn [name] is a 38 y.o. G3P1011 at [redacted]w[redacted]d here for SOL with SROM at approximately 0300 this morning.  Labor: expectant management -- pain control: prn fentanyl q1hr until 8cm  Fetal Wellbeing: EFW 6-7lbs by Leopold's. Cephalic by RN check.  -- GBS (negative) -- continuous fetal monitoring - Cat 1   Postpartum Planning -- breast/unsure -- Rh+ / given Tdap   Burman Nieves, MD Family Medicine Resident   OB FELLOW HISTORY AND PHYSICAL ATTESTATION  I have seen and examined this patient; I agree with above documentation in the resident's note.   Marcy Siren, D.O. OB Fellow  03/06/2019, 3:31 PM

## 2019-03-05 NOTE — MAU Note (Signed)
Dawn [name] is a 38 y.o. at [redacted]w[redacted]d here in MAU reporting: contractions since she left MAU this morning. They were regular and then became irregular and now she states they are every 10 min or less. Having some bloody mucus, denies LOF. Some decreased fetal movement since this AM  Onset of complaint: this morning  Pain score: 7/10  Vitals:   03/05/19 1537 03/05/19 1540  BP:  125/65  Pulse:  (!) 103  Resp:  18  Temp:  98.4 F (36.9 C)  SpO2: 100% 100%      Lab orders placed from triage: none

## 2019-03-06 LAB — CBC
HCT: 32.4 % — ABNORMAL LOW (ref 36.0–46.0)
HEMOGLOBIN: 9.9 g/dL — AB (ref 12.0–15.0)
MCH: 27.2 pg (ref 26.0–34.0)
MCHC: 30.6 g/dL (ref 30.0–36.0)
MCV: 89 fL (ref 80.0–100.0)
Platelets: 169 10*3/uL (ref 150–400)
RBC: 3.64 MIL/uL — AB (ref 3.87–5.11)
RDW: 17 % — ABNORMAL HIGH (ref 11.5–15.5)
WBC: 16.3 10*3/uL — ABNORMAL HIGH (ref 4.0–10.5)
nRBC: 0 % (ref 0.0–0.2)

## 2019-03-06 LAB — RPR: RPR Ser Ql: NONREACTIVE

## 2019-03-06 NOTE — Progress Notes (Signed)
MOB was referred for history of depression.  * Referral screened out by Clinical Social Worker because none of the following criteria appear to apply:  -History of anxiety/depression during this pregnancy, or of post-partum depression following prior delivery. -Diagnosis of anxiety and/or depression within last 3 years. Per [facility] records, MOB diagnosed in 2015. OR * MOB's symptoms currently being treated with medication and/or therapy.  Please contact the Clinical Social Worker if needs arise, by MOB request, or if MOB scores greater than 9/yes to question 10 on Edinburgh Postpartum Depression Screen.  Tony Friscia Irwin, LCSWA  [facility] [phone]  

## 2019-03-06 NOTE — Lactation Note (Signed)
This note was copied from a baby's chart. Lactation Consultation Note  Patient Name: Dawn [name] [id number] Date: 03/06/2019 Reason for consult: Follow-up assessment;Difficult latch;Term  P2 mother whose infant is now 42 hours old.  Mother did not breast feed her first child.    Mother requested LC for reassurance that she had baby latched well.  Baby was latched in the cross cradle position on the right breast when I arrived.  He had a wide gape and flanged lips with rhythmic sucking noted.  Mother was doing intermittent breast compressions.  I demonstrated better positioning but praised her efforts for a good latch.  Observed baby feeding well for 8 minutes prior to my departure.  Mother will call as needed.  Encouraged to continue feeding 8-12 times/24 hours or sooner if he shows feeding cues.  Mother will also continue hand expression.  She is wearing breast shells in between feedings.    Maternal Data Formula Feeding for Exclusion: No Has patient been taught Hand Expression?: Yes Does the patient have breastfeeding experience prior to this delivery?: No  Feeding Feeding Type: Breast Fed  LATCH Score Latch: Grasps breast easily, tongue down, lips flanged, rhythmical sucking.  Audible Swallowing: A few with stimulation  Type of Nipple: Everted at rest and after stimulation(short shafted)  Comfort (Breast/Nipple): Soft / non-tender  Hold (Positioning): Assistance needed to correctly position infant at breast and maintain latch.  LATCH Score: 8  Interventions Interventions: Breast feeding basics reviewed;Assisted with latch;Skin to skin;Breast massage;Hand express;Breast compression;Position options;Support pillows;Adjust position;DEBP  Lactation Tools Discussed/Used     Consult Status Consult Status: Follow-up Date: 03/07/19 Follow-up type: In-patient    Dora Sims 03/06/2019, 6:43 PM

## 2019-03-06 NOTE — Discharge Instructions (Signed)
Vaginal Delivery, Care After °Refer to this sheet in the next few weeks. These instructions provide you with information about caring for yourself after vaginal delivery. Your health care provider may also give you more specific instructions. Your treatment has been planned according to current medical practices, but problems sometimes occur. Call your health care provider if you have any problems or questions. °What can I expect after the procedure? °After vaginal delivery, it is common to have: °· Some bleeding from your vagina. °· Soreness in your abdomen, your vagina, and the area of skin between your vaginal opening and your anus (perineum). °· Pelvic cramps. °· Fatigue. °Follow these instructions at home: °Medicines °· Take over-the-counter and prescription medicines only as told by your health care provider. °· If you were prescribed an antibiotic medicine, take it as told by your health care provider. Do not stop taking the antibiotic until it is finished. °Driving ° °· Do not drive or operate heavy machinery while taking prescription pain medicine. °· Do not drive for 24 hours if you received a sedative. °Lifestyle °· Do not drink alcohol. This is especially important if you are breastfeeding or taking medicine to relieve pain. °· Do not use tobacco products, including cigarettes, chewing tobacco, or e-cigarettes. If you need help quitting, ask your health care provider. °Eating and drinking °· Drink at least 8 eight-ounce glasses of water every day unless you are told not to by your health care provider. If you choose to breastfeed your baby, you may need to drink more water than this. °· Eat high-fiber foods every day. These foods may help prevent or relieve constipation. High-fiber foods include: °? Whole grain cereals and breads. °? Brown rice. °? Beans. °? Fresh fruits and vegetables. °Activity °· Return to your normal activities as told by your health care provider. Ask your health care provider what  activities are safe for you. °· Rest as much as possible. Try to rest or take a nap when your baby is sleeping. °· Do not lift anything that is heavier than your baby or 10 lb (4.5 kg) until your health care provider says that it is safe. °· Talk with your health care provider about when you can engage in sexual activity. This may depend on your: °? Risk of infection. °? Rate of healing. °? Comfort and desire to engage in sexual activity. °Vaginal Care °· If you have an episiotomy or a vaginal tear, check the area every day for signs of infection. Check for: °? More redness, swelling, or pain. °? More fluid or blood. °? Warmth. °? Pus or a bad smell. °· Do not use tampons or douches until your health care provider says this is safe. °· Watch for any blood clots that may pass from your vagina. These may look like clumps of dark red, brown, or black discharge. °General instructions °· Keep your perineum clean and dry as told by your health care provider. °· Wear loose, comfortable clothing. °· Wipe from front to back when you use the toilet. °· Ask your health care provider if you can shower or take a bath. If you had an episiotomy or a perineal tear during labor and delivery, your health care provider may tell you not to take baths for a certain length of time. °· Wear a bra that supports your breasts and fits you well. °· If possible, have someone help you with household activities and help care for your baby for at least a few days after you   leave the hospital. °· Keep all follow-up visits for you and your baby as told by your health care provider. This is important. °Contact a health care provider if: °· You have: °? Vaginal discharge that has a bad smell. °? Difficulty urinating. °? Pain when urinating. °? A sudden increase or decrease in the frequency of your bowel movements. °? More redness, swelling, or pain around your episiotomy or vaginal tear. °? More fluid or blood coming from your episiotomy or vaginal  tear. °? Pus or a bad smell coming from your episiotomy or vaginal tear. °? A fever. °? A rash. °? Little or no interest in activities you used to enjoy. °? Questions about caring for yourself or your baby. °· Your episiotomy or vaginal tear feels warm to the touch. °· Your episiotomy or vaginal tear is separating or does not appear to be healing. °· Your breasts are painful, hard, or turn red. °· You feel unusually sad or worried. °· You feel nauseous or you vomit. °· You pass large blood clots from your vagina. If you pass a blood clot from your vagina, save it to show to your health care provider. Do not flush blood clots down the toilet without having your health care provider look at them. °· You urinate more than usual. °· You are dizzy or light-headed. °· You have not breastfed at all and you have not had a menstrual period for 12 weeks after delivery. °· You have stopped breastfeeding and you have not had a menstrual period for 12 weeks after you stopped breastfeeding. °Get help right away if: °· You have: °? Pain that does not go away or does not get better with medicine. °? Chest pain. °? Difficulty breathing. °? Blurred vision or spots in your vision. °? Thoughts about hurting yourself or your baby. °· You develop pain in your abdomen or in one of your legs. °· You develop a severe headache. °· You faint. °· You bleed from your vagina so much that you fill two sanitary pads in one hour. °This information is not intended to replace advice given to you by your health care provider. Make sure you discuss any questions you have with your health care provider. °Document Released: 11/27/2000 Document Revised: 05/13/2016 Document Reviewed: 12/15/2015 °Elsevier Interactive Patient Education © 2019 [employer] ° °

## 2019-03-06 NOTE — Lactation Note (Signed)
This note was copied from a baby's chart. Lactation Consultation Note  Patient Name: Dawn [name] [id number] Date: 03/06/2019 Reason for consult: Initial assessment;Term;1st time breastfeeding  P2 mother whose infant is now 67 hours old.  Mother has a 40 year old whom she did not breast feed.  She was able to pump and bottle feed for one year.  Her goal with this baby is to exclusively breast feed.  Baby had just finished getting a bath when I arrived.  Offered to assist with latching and mother accepted.  Mother's breasts are soft and non tender and nipples are everted but short shafted.  Attempted to latch baby onto the left breast in the football hold but he was very sleepy.  Gentle stimulation performed but he continued to sleep.  Placed him STS with mother.  Encouraged mother to feed 8-12 times/24 hours of sooner if baby shows feeding cues.  Reviewed cues.  Colostrum container provided for any EBM she obtains with hand expression.  She was shown hand expression after delivery but may need reinforcement at the next feeding.  She is holding baby now and will wait for him to awaken.    Breast shells provided with instructions for use.  Manual pump at bedside.  Encouraged mother to begin wearing the shells and to pre-pump prior to latching.  Mother will call for latch assistance as needed.  Mom made aware of O/P services, breastfeeding support groups, community resources, and our phone # for post-discharge questions.    Maternal Data Formula Feeding for Exclusion: No Has patient been taught Hand Expression?: Yes Does the patient have breastfeeding experience prior to this delivery?: No  Feeding Feeding Type: Breast Fed  LATCH Score Latch: Too sleepy or reluctant, no latch achieved, no sucking elicited.  Audible Swallowing: None  Type of Nipple: Everted at rest and after stimulation(short shafted)  Comfort (Breast/Nipple): Soft / non-tender  Hold (Positioning): Assistance needed to  correctly position infant at breast and maintain latch.  LATCH Score: 5  Interventions Interventions: Breast feeding basics reviewed;Assisted with latch;Skin to skin;Breast massage;Hand express;Pre-pump if needed;Breast compression;Hand pump;Shells;Position options;Support pillows;Adjust position;DEBP  Lactation Tools Discussed/Used     Consult Status Consult Status: Follow-up Date: 03/07/19 Follow-up type: In-patient    Avelina Mcclurkin R Ary Rudnick 03/06/2019, 11:45 AM

## 2019-03-06 NOTE — Progress Notes (Signed)
POSTPARTUM PROGRESS NOTE  Post Partum Day 1  Subjective:  Dawn [name] is a 38 y.o. [id number] s/p SVD at [redacted]w[redacted]d.  She reports she is doing well. No acute events overnight. She denies any problems with ambulating, voiding or po intake. Denies nausea or vomiting.  Pain is well controlled.  Lochia is mild.  Objective: Blood pressure 99/66, pulse 82, temperature 98.5 F (36.9 C), temperature source Axillary, resp. rate 17, height 5\' 5"  (1.651 m), weight 79.4 kg, last menstrual period 06/03/2018, SpO2 100 %, unknown if currently breastfeeding.  Physical Exam:  General: alert, cooperative and no distress Chest: no respiratory distress Heart:regular rate, distal pulses intact Abdomen: soft, nontender,  Uterine Fundus: firm, appropriately tender DVT Evaluation: No calf swelling or tenderness Extremities: no LE edema Skin: warm, dry  Recent Labs    03/05/19 1618 03/06/19 0625  HGB 11.2* 9.9*  HCT 35.9* 32.4*    Assessment/Plan: Dawn [name] is a 38 y.o. [id number] s/p SVD at [redacted]w[redacted]d   PPD#1 - Doing well, VSS  Routine postpartum care Contraception: Plans vasectomy Feeding: breast Dispo: Plan for discharge tomorrow.   LOS: 1 day   Orpah Cobb, D.O. [facility], PGY1 03/06/2019, 11:21 AM

## 2019-03-07 MED ORDER — PRENATAL MULTIVITAMIN CH
1.0000 | ORAL_TABLET | Freq: Every day | ORAL | 3 refills | Status: DC
Start: 2019-03-07 — End: 2019-04-10

## 2019-03-07 MED ORDER — WITCH HAZEL-GLYCERIN EX PADS: 1.0000 "application " | MEDICATED_PAD | CUTANEOUS | 12 refills | Status: DC | PRN

## 2019-03-07 MED ORDER — ACETAMINOPHEN 325 MG PO TABS: 650.0000 mg | ORAL_TABLET | ORAL | 3 refills | Status: DC | PRN

## 2019-03-07 MED ORDER — IBUPROFEN 800 MG PO TABS: 800.0000 mg | ORAL_TABLET | Freq: Three times a day (TID) | ORAL | 0 refills | Status: DC | PRN

## 2019-03-07 MED ORDER — SIMETHICONE 80 MG PO CHEW: 80.0000 mg | CHEWABLE_TABLET | ORAL | 0 refills | Status: DC | PRN

## 2019-03-07 NOTE — Lactation Note (Signed)
This note was copied from a baby's chart. Lactation Consultation Note  Patient Name: Dawn [name] [id number] Date: 03/07/2019 Reason for consult: Follow-up assessment;Difficult latch;Term  P2 mother whose infant is now 35 hours old.  Mother has a 40 year old whom she did not breast feed.  She was able to pump and bottle feed for one year.    Mother's breasts are soft and non tender.  Nipples are everted but short shafted.  Her nipples are slightly reddened and tender.  Mother stated baby had been feeding well since my visit yesterday until about 0300 when he had a large emesis.  He was restless and irritated for a couple of hours.  Mother stated that he has been passing a lot of gas.  Offered to assist with latching and mother agreeable.  She had 5 mls of pumped milk at bedside which I spoon fed to him prior to latch attempt.  Assisted to latch in the cross cradle hold on the left breast per mother's request.  Baby was resltless and agitated and would not latch.  Burped him and attempted a second time with the same results.  Suggested the football hold on the right breast and mother was willing to try.  Baby latched a took a few sucks before becoming agitated.  He was passing gas.  I burped him and tried a second time.  Again, he latched and took a few sucks before becoming agitated again.  I suggest we swaddle baby and mother use the DEBP.  Swaddled baby tightly and burped him again.  He settled quietly and fell asleep.  Placed him in the bassinet and observed mother pumping.  Answered her questions during pumping.  She was able to obtain 5 mls which will be saved for the next feeding.  Family is awaiting the pediatrician's visit later today to determine if baby will be discharged home.  Father present.  Encouraged mother to call if she needs assistance at the next latch.   Maternal Data Formula Feeding for Exclusion: No Has patient been taught Hand Expression?: Yes Does the patient have  breastfeeding experience prior to this delivery?: No  Feeding Feeding Type: Breast Fed  LATCH Score Latch: Too sleepy or reluctant, no latch achieved, no sucking elicited.  Audible Swallowing: None  Type of Nipple: Everted at rest and after stimulation(short shafted)  Comfort (Breast/Nipple): Soft / non-tender  Hold (Positioning): Assistance needed to correctly position infant at breast and maintain latch.  LATCH Score: 5  Interventions Interventions: Breast feeding basics reviewed;Assisted with latch;Skin to skin;Breast massage;Hand express;Breast compression;Hand pump;Position options;Support pillows;Adjust position;DEBP  Lactation Tools Discussed/Used Initiated by:: Already initiated   Consult Status Consult Status: Complete Date: 03/07/19 Follow-up type: Call as needed    Dawn [name] 03/07/2019, 11:17 AM

## 2019-03-07 NOTE — Lactation Note (Signed)
This note was copied from a baby's chart. Lactation Consultation Note  Patient Name: Dawn [name] Date: 03/07/2019 Reason for consult: Follow-up assessment;Mother's request;Difficult latch;1st time breastfeeding  P2 mother whose infant is now 59 hours old.  Baby will remain tonight as a baby patient in hopes of being discharged tomorrow.  Mother had finished breast feeding when I arrived.  She had 5 mls of EBM which she fed back to baby.  Mother will be supplementing further with formula.  Her RN has discussed this with her and mother verbalized understanding.  Reviewed the feeding supplementing guideline sheet with parents.  Mother was interested in learning how to feed using the foley cup.  I demonstrated this tool for the parents and baby fed easily using this method.  I fed him an additional supplement of Similac 20 without difficulty.  Burped well and placed in bassinet.  Mother's plan will be to breast feed, supplement with any EBM she obtains and then add formula to minimum guidelines.  I suggested mother feed more if baby is interested.  RN updated and mother feels more comfortable with feeding and plan for the remainder of the day/evening now.  She will call for any questions/concerns she may have.  RN updated.   Maternal Data Formula Feeding for Exclusion: No Has patient been taught Hand Expression?: Yes Does the patient have breastfeeding experience prior to this delivery?: No  Feeding Feeding Type: Breast Fed  LATCH Score Latch: (Did not observe a LATCH)  Audible Swallowing: None  Type of Nipple: Everted at rest and after stimulation(short shafted)  Comfort (Breast/Nipple): Soft / non-tender  Hold (Positioning): Assistance needed to correctly position infant at breast and maintain latch.  LATCH Score: 5  Interventions Interventions: Breast feeding basics reviewed;Assisted with latch;Skin to skin;Breast massage;Hand express;Breast compression;Hand  pump;Position options;Support pillows;Adjust position;DEBP  Lactation Tools Discussed/Used Initiated by:: Already initiated   Consult Status Consult Status: Follow-up Date: 03/08/19 Follow-up type: In-patient    Dawn [name] 03/07/2019, 2:31 PM

## 2019-03-08 ENCOUNTER — Ambulatory Visit: Payer: Self-pay

## 2019-03-08 NOTE — Lactation Note (Signed)
This note was copied from a baby's chart. Lactation Consultation Note  Patient Name: Boy Shealee Manrriquez [id number] Date: 03/08/2019 Reason for consult: Follow-up assessment;Term;1st time breastfeeding  P2 mother whose infant is now 27 hours old.  Mother has a 40 year old whom she did not breast feed.  She was able to pump and bottle feed for one year.  Mother's breasts are soft and non tender.  Nipples are everted but short shafted. Nipples are slightly reddened and sensitive but no breakdown noted. She has been using EBM, coconut oil and I provided comfort gels today with instructions for use.    Mother continues to breast feed on cue and supplement with her EBM and formula to guidelines.  She is using the foley cup for supplementation and enjoys this tool.  Baby has been adapting well with this method.  His weight loss today is 5% down from birth weight.    Mother had a few questions which I answered to her satisfaction.  She has been very diligent with her feeding and pumping routine and very determined to be successful with breast feeding.  I have appreciated working with this family.  Father has been present and supportive.    Mother has a DEBP for home use and will take the manual pump provided here at the hospital.  She has our phone number for any questions/concerns after discharge.  Family is looking forward to discharge today.    Maternal Data Formula Feeding for Exclusion: No Has patient been taught Hand Expression?: Yes Does the patient have breastfeeding experience prior to this delivery?: No  Feeding Feeding Type: Bottle Fed - Breast Milk  LATCH Score                   Interventions    Lactation Tools Discussed/Used     Consult Status Consult Status: Complete Date: 03/08/19 Follow-up type: Call as needed    Irene Pap Mael Delap 03/08/2019, 9:36 AM

## 2019-03-28 ENCOUNTER — Other Ambulatory Visit: Payer: Self-pay | Admitting: Obstetrics & Gynecology

## 2019-03-28 DIAGNOSIS — Z1231 Encounter for screening mammogram for malignant neoplasm of breast: Secondary | ICD-10-CM

## 2019-04-10 ENCOUNTER — Telehealth: Payer: Self-pay | Admitting: Cardiology

## 2019-04-10 ENCOUNTER — Telehealth (INDEPENDENT_AMBULATORY_CARE_PROVIDER_SITE_OTHER): Payer: Federal, State, Local not specified - PPO | Admitting: Cardiology

## 2019-04-10 ENCOUNTER — Other Ambulatory Visit: Payer: Self-pay

## 2019-04-10 ENCOUNTER — Encounter: Payer: Self-pay | Admitting: Cardiology

## 2019-04-10 VITALS — BP 102/65 | HR 91 | Wt 150.5 lb

## 2019-04-10 DIAGNOSIS — R002 Palpitations: Secondary | ICD-10-CM | POA: Diagnosis not present

## 2019-04-10 DIAGNOSIS — Z7189 Other specified counseling: Secondary | ICD-10-CM

## 2019-04-10 NOTE — Telephone Encounter (Signed)
Virtual Visit Pre-Appointment Phone Call  "(Name), I am calling you today to discuss your upcoming appointment. We are currently trying to limit exposure to the virus that causes COVID-19 by seeing patients at home rather than in the office."  1. "What is the BEST phone number to call the day of the visit?" - include this in appointment notes  2. Do you have or have access to (through a family member/friend) a smartphone with video capability that we can use for your visit?" a. If yes - list this number in appt notes as cell (if different from BEST phone #) and list the appointment type as a VIDEO visit in appointment notes b. If no - list the appointment type as a PHONE visit in appointment notes  3. Confirm consent - "In the setting of the current Covid19 crisis, you are scheduled for a (phone or video) visit with your provider on (date) at (time).  Just as we do with many in-office visits, in order for you to participate in this visit, we must obtain consent.  If you'd like, I can send this to your mychart (if signed up) or email for you to review.  Otherwise, I can obtain your verbal consent now.  All virtual visits are billed to your insurance company just like a normal visit would be.  By agreeing to a virtual visit, we'd like you to understand that the technology does not allow for your provider to perform an examination, and thus may limit your provider's ability to fully assess your condition. If your provider identifies any concerns that need to be evaluated in person, we will make arrangements to do so.  Finally, though the technology is pretty good, we cannot assure that it will always work on either your or our end, and in the setting of a video visit, we may have to convert it to a phone-only visit.  In either situation, we cannot ensure that we have a secure connection.  Are you willing to proceed?" STAFF: Did the patient verbally acknowledge consent to telehealth visit? Document  YES/NO here: yes  4. Advise patient to be prepared - "Two hours prior to your appointment, go ahead and check your blood pressure, pulse, oxygen saturation, and your weight (if you have the equipment to check those) and write them all down. When your visit starts, your provider will ask you for this information. If you have an Apple Watch or Kardia device, please plan to have heart rate information ready on the day of your appointment. Please have a pen and paper handy nearby the day of the visit as well."  5. Give patient instructions for MyChart download to smartphone OR Doximity/Doxy.me as below if video visit (depending on what platform provider is using)  6. Inform patient they will receive a phone call 15 minutes prior to their appointment time (may be from unknown caller ID) so they should be prepared to answer    TELEPHONE CALL NOTE  Dawn [name] has been deemed a candidate for a follow-up tele-health visit to limit community exposure during the Covid-19 pandemic. I spoke with the patient via phone to ensure availability of phone/video source, confirm preferred email & phone number, and discuss instructions and expectations.  I reminded Dawn [name] to be prepared with any vital sign and/or heart rhythm information that could potentially be obtained via home monitoring, at the time of her visit. I reminded Dawn [name] to expect a phone call prior to  her visit.  Alvy Bealaula A Perzee 04/10/2019 9:57 AM   INSTRUCTIONS FOR DOWNLOADING THE MYCHART APP TO SMARTPHONE  - The patient must first make sure to have activated MyChart and know their login information - If Apple, go to [employer] Store and type in MyChart in the search bar and download the app. If Android, ask patient to go to [employer] Play Store and type in [place] in the search bar and download the app. The app is free but as with any other app downloads, their phone may require them to verify saved payment information or Apple/Android  password.  - The patient will need to then log into the app with their MyChart username and password, and select [place] as their healthcare provider to link the account. When it is time for your visit, go to the MyChart app, find appointments, and click Begin Video Visit. Be sure to Select Allow for your device to access the Microphone and Camera for your visit. You will then be connected, and your provider will be with you shortly.  **If they have any issues connecting, or need assistance please contact MyChart service desk [phone])**  **If using a computer, in order to ensure the best quality for their visit they will need to use either of the following Internet Browsers: [employer] Edge, or Google Chrome**  IF USING DOXIMITY or DOXY.ME - The patient will receive a link just prior to their visit by text.     FULL LENGTH CONSENT FOR TELE-HEALTH VISIT   I hereby voluntarily request, consent and authorize [facility] and its employed or contracted physicians, physician assistants, nurse practitioners or other licensed health care professionals (the Practitioner), to provide me with telemedicine health care services (the Services") as deemed necessary by the treating Practitioner. I acknowledge and consent to receive the Services by the Practitioner via telemedicine. I understand that the telemedicine visit will involve communicating with the Practitioner through live audiovisual communication technology and the disclosure of certain medical information by electronic transmission. I acknowledge that I have been given the opportunity to request an in-person assessment or other available alternative prior to the telemedicine visit and am voluntarily participating in the telemedicine visit.  I understand that I have the right to withhold or withdraw my consent to the use of telemedicine in the course of my care at any time, without affecting my right to future care or treatment,  and that the Practitioner or I may terminate the telemedicine visit at any time. I understand that I have the right to inspect all information obtained and/or recorded in the course of the telemedicine visit and may receive copies of available information for a reasonable fee.  I understand that some of the potential risks of receiving the Services via telemedicine include:   Delay or interruption in medical evaluation due to technological equipment failure or disruption;  Information transmitted may not be sufficient (e.g. poor resolution of images) to allow for appropriate medical decision making by the Practitioner; and/or   In rare instances, security protocols could fail, causing a breach of personal health information.  Furthermore, I acknowledge that it is my responsibility to provide information about my medical history, conditions and care that is complete and accurate to the best of my ability. I acknowledge that Practitioner's advice, recommendations, and/or decision may be based on factors not within their control, such as incomplete or inaccurate data provided by me or distortions of diagnostic images or specimens that may result from electronic transmissions. I  understand that the practice of medicine is not an exact science and that Practitioner makes no warranties or guarantees regarding treatment outcomes. I acknowledge that I will receive a copy of this consent concurrently upon execution via email to the email address I last provided but may also request a printed copy by calling the office of [place].    I understand that my insurance will be billed for this visit.   I have read or had this consent read to me.  I understand the contents of this consent, which adequately explains the benefits and risks of the Services being provided via telemedicine.   I have been provided ample opportunity to ask questions regarding this consent and the Services and have had my questions  answered to my satisfaction.  I give my informed consent for the services to be provided through the use of telemedicine in my medical care  By participating in this telemedicine visit I agree to the above.

## 2019-04-10 NOTE — Patient Instructions (Addendum)
Medication Instructions:  Your physician recommends that you continue on your current medications as directed. Please refer to the Current Medication list given to you today.  If you need a refill on your cardiac medications before your next appointment, please call your pharmacy.   Lab work: None  If you have labs (blood work) drawn today and your tests are completely normal, you will receive your results only by: . MyChart Message (if you have MyChart) OR . A paper copy in the mail If you have any lab test that is abnormal or we need to change your treatment, we will call you to review the results.  Testing/Procedures: None  Follow-Up: At [facility], you and your health needs are our priority.  As part of our continuing mission to provide you with exceptional heart care, we have created designated Provider Care Teams.  These Care Teams include your primary Cardiologist (physician) and Advanced Practice Providers (APPs -  Physician Assistants and Nurse Practitioners) who all work together to provide you with the care you need, when you need it. You will need a follow up appointment as needed if symptoms worsen or fail to improve.     1. Avoid all over-the-counter antihistamines except Claritin/Loratadine and Zyrtec/Cetrizine. 2. Avoid all combination including cold sinus allergies flu decongestant and sleep medications 3. You can use Robitussin DM Mucinex and Mucinex DM for cough. 4. can use Tylenol aspirin ibuprofen and naproxen but no combinations such as sleep or sinus.1. Avoid all over-the-counter antihistamines except Claritin/Loratadine and Zyrtec/Cetrizine. 2. Avoid all combination including cold sinus allergies flu decongestant and sleep medications 3. You can use Robitussin DM Mucinex and Mucinex DM for cough. 4. can use Tylenol aspirin ibuprofen and naproxen but no combinations such as sleep or sinus. 

## 2019-04-10 NOTE — Progress Notes (Signed)
Virtual Visit via Video Note   This visit type was conducted due to national recommendations for restrictions regarding the COVID-19 Pandemic (e.g. social distancing) in an effort to limit this patient's exposure and mitigate transmission in our community.  Due to her co-morbid illnesses, this patient is at least at moderate risk for complications without adequate follow up.  This format is felt to be most appropriate for this patient at this time.  All issues noted in this document were discussed and addressed.  A limited physical exam was performed with this format.  Please refer to the patient's chart for her consent to telehealth for [facility].   Evaluation Performed:  Follow-up visit  Date:  04/10/2019   ID:  Dawn [name], DOB 1981/04/12, MRN [id number]  Patient Location: Home Provider Location: Home  PCP:  Monica Becton, MD  Cardiologist:  No primary care provider on file. Dr Dulce Sellar Electrophysiologist:  None   Chief Complaint: Follow-up for palpitation which occurred in the last trimester pregnancy  History of Present Illness:    Dawn [name] is a 38 y.o. female with palpitation last seen 01/24/19 prior to term pregnancy delivery of female 03/05/19.  Her pregnancy delivery was uncomplicated.  Since then she has had no palpitation she lost her pregnancy weight quickly within a few days and no edema shortness of breath chest pain or syncope.  She is due iPhone adapter today and she is in sinus rhythm  The patient does not have symptoms concerning for COVID-19 infection (fever, chills, cough, or new shortness of breath).    Past Medical History:  Diagnosis Date  . AMA (advanced maternal age) multigravida 35+ 08/12/2018  . Anemia    with pregnancy  . Benign paroxysmal positional vertigo 05/11/2017  . Depression    does not currently take meds  . Goiter, nontoxic, multinodular 10/19/2008   Overview:  Nontoxic Multinodular Goiter Nml labs, [place]  . Herniated  cervical disc 2016  . Hip pain   . Hyperlipidemia 03/15/2017  . Lumbar degenerative disc disease 05/25/2016  . Muscle spasm of back 12/13/2017  . Myofascial pain syndrome 08/27/2014   negative cervical spine MRI, a negative brain MRI, nerve conduction and electromyography, and normal extensive blood work for peripheral neuropathy and myopathy.   . Nontoxic multinodular goiter 10/19/2008   Overview:  Nontoxic Multinodular Goiter  . Overactive bladder 09/24/2014  . Patellofemoral pain syndrome 06/04/2015  . Right tennis elbow   . Subconjunctival hematoma, left 09/14/2017  . Supervision of other high risk pregnancy, antepartum 08/12/2018    Nursing Staff Provider Office Location  [facility] Dating  10/LMP Language  English Anatomy US   normal Flu Vaccine  08/26/18 Genetic Screen  NIPS:Negative  AFP:  Normal  First Screen:  Quad:   TDaP vaccine   12/16/18 Hgb A1C or  GTT Early  Third trimester  Rhogam  NA   LAB RESULTS  Feeding Plan  Breast Blood Type AB/RH(D) POSITIVE/-- (04/18 9371)  Contraception  NFP Antibody NO ANTIBODIES DETECTED (04/  . Thyroid goiter    benign  . Varicose vein of leg 06/04/2015   Past Surgical History:  Procedure Laterality Date  . TONSILLECTOMY  1986     No outpatient medications have been marked as taking for the 04/10/19 encounter (Appointment) with Baldo Daub, MD.     Allergies:   Latex   Social History   Tobacco Use  . Smoking status: Never Smoker  . Smokeless tobacco: Never Used  Substance Use Topics  . Alcohol use: Not Currently    Alcohol/week: 0.0 standard drinks  . Drug use: No     Family Hx: The patient's family history includes Birth defects in her maternal grandfather and maternal grandmother; Breast cancer in her sister; Breast cancer (age of onset: 32) in her maternal aunt; Breast cancer (age of onset: 19) in her maternal aunt; Cancer in her maternal grandfather and maternal grandmother; Hyperlipidemia in her father; Hypertension in her sister.  ROS:    Please see the history of present illness.     All other systems reviewed and are negative.   Prior CV studies:   The following studies were reviewed today:    Labs/Other Tests and Data Reviewed:    EKG:  The patient's Kardia Mobile cardiac telemetry strip(s) personally reviewed today demonstrate:  SRTH 87 BPM today  Recent Labs: 03/06/2019: Hemoglobin 9.9; Platelets 169   Recent Lipid Panel Lab Results  Component Value Date/Time   CHOL 205 (H) 04/08/2018 07:42 AM   TRIG 62 04/08/2018 07:42 AM   HDL 71 04/08/2018 07:42 AM   CHOLHDL 2.9 04/08/2018 07:42 AM   LDLCALC 119 (H) 04/08/2018 07:42 AM    Wt Readings from Last 3 Encounters:  03/05/19 175 lb (79.4 kg)  03/05/19 175 lb 14.4 oz (79.8 kg)  03/02/19 174 lb (78.9 kg)     Objective:    Vital Signs:  There were no vitals taken for this visit.   VITAL SIGNS:  reviewed GEN:  no acute distress EYES:  sclerae anicteric, EOMI - Extraocular Movements Intact RESPIRATORY:  normal respiratory effort, symmetric expansion CARDIOVASCULAR:  no peripheral edema SKIN:  no rash, lesions or ulcers. MUSCULOSKELETAL:  no obvious deformities. NEURO:  alert and oriented x 3, no obvious focal deficit PSYCH:  normal affect  ASSESSMENT & PLAN:    1. Palpitation, improved no recurrence I suspect this was related to pregnancy sinus tachycardia with impaired venous return to the heart she is reassured avoid over-the-counter proarrhythmic drugs and has the iPhone adapter to record EKG and to follow-up with me as needed in the future.  In particular I do not think she requires cardiac imaging.   COVID-19 Education: The signs and symptoms of COVID-19 were discussed with the patient and how to seek care for testing (follow up with PCP or arrange E-visit).  The importance of social distancing was discussed today.  Time:   Today, I have spent 25 minutes with the patient with telehealth technology discussing the above problems.      Medication Adjustments/Labs and Tests Ordered: Current medicines are reviewed at length with the patient today.  Concerns regarding medicines are outlined above.   Tests Ordered: No orders of the defined types were placed in this encounter.   Medication Changes: No orders of the defined types were placed in this encounter.   Disposition:  Follow up prn  Signed, Norman Herrlich, MD  04/10/2019 7:59 AM    [facility]

## 2019-04-18 ENCOUNTER — Other Ambulatory Visit: Payer: Self-pay

## 2019-04-18 ENCOUNTER — Encounter: Payer: Self-pay | Admitting: Advanced Practice Midwife

## 2019-04-18 ENCOUNTER — Ambulatory Visit (INDEPENDENT_AMBULATORY_CARE_PROVIDER_SITE_OTHER): Payer: Federal, State, Local not specified - PPO | Admitting: Advanced Practice Midwife

## 2019-04-18 DIAGNOSIS — Z3009 Encounter for other general counseling and advice on contraception: Secondary | ICD-10-CM

## 2019-04-18 DIAGNOSIS — Z1389 Encounter for screening for other disorder: Secondary | ICD-10-CM

## 2019-04-18 NOTE — Progress Notes (Signed)
TELEHEALTH WEBEX POSTPARTUM VISIT ENCOUNTER NOTE  I connected with@ on 04/18/19 at  1:15 PM EDT via WebEx at home and verified that I am speaking with the correct person using two identifiers.   I discussed the limitations, risks, security and privacy concerns of performing an evaluation and management service by telephone and the availability of in person appointments. I also discussed with the patient that there may be a patient responsible charge related to this service. The patient expressed understanding and agreed to proceed.  Appointment Date: 04/18/2019  OBGYN Clinic: [facility]  Chief Complaint:  Chief Complaint  Patient presents with  . Postpartum Care    History of Present Illness: Dawn [name] is a 38 y.o. G18P2012 female who presents for a postpartum visit. She is 6 weeks postpartum following a spontaneous vaginal delivery. I have fully reviewed the prenatal and intrapartum course. The delivery was at 39 gestational weeks 2 days.  Anesthesia: none. Postpartum course has been unremarkable. Baby's course has been unremarkable. Baby is feeding by breast. Bleeding no bleeding. Bowel function is normal. Bladder function is normal. Patient is not sexually active. Contraception method is none. Postpartum depression screening:neg  The following portions of the patient's history were reviewed and updated as appropriate: allergies, current medications, past family history, past medical history, past social history, past surgical history and problem list. Last pap smear done 2017 and was Normal.  Mammogram scheduled in 2 months at age 38.  Review of Systems: Her 12 point review of systems is negative or as noted in the History of Present Illness.  Patient Active Problem List   Diagnosis Date Noted  . Normal labor 03/05/2019  . AMA (advanced maternal age) multigravida 35+ 08/12/2018  . Supervision of other high risk pregnancy, antepartum 08/12/2018  . Pregnancy 03/31/2018   . Elevated PTHrP level 03/31/2018  . Muscle spasm of back 12/13/2017  . Subconjunctival hematoma, left 09/14/2017  . Benign paroxysmal positional vertigo 05/11/2017  . Hyperlipidemia 03/15/2017  . Lumbar degenerative disc disease 05/25/2016  . Varicose vein of leg 06/04/2015  . Patellofemoral pain syndrome 06/04/2015  . Overactive bladder 09/24/2014  . Myofascial pain syndrome 08/27/2014  . Family history of breast cancer 09/12/2013  . Nontoxic multinodular goiter 10/19/2008  . Goiter, nontoxic, multinodular 10/19/2008    Medications Correen L. Corio had no medications administered during this visit. Current Outpatient Medications  Medication Sig Dispense Refill  . ferrous sulfate 325 (65 FE) MG tablet Take 1 tablet (325 mg total) by mouth 2 (two) times daily with a meal. 60 tablet 3  . Prenat-FeFum-FePo-FA-Omega 3 (CONCEPT DHA) 53.5-38-1 MG CAPS Take 1 tablet by mouth daily. 30 capsule 12  . acetaminophen (TYLENOL) 325 MG tablet Take 2 tablets (650 mg total) by mouth every 4 (four) hours as needed (for pain scale < 4). 30 tablet 3  . ibuprofen (ADVIL,MOTRIN) 800 MG tablet Take 1 tablet (800 mg total) by mouth every 8 (eight) hours as needed. 30 tablet 0  . pantoprazole (PROTONIX) 40 MG tablet Take 1 tablet (40 mg total) by mouth 2 (two) times daily. (Patient not taking: Reported on 04/18/2019) 30 tablet 0  . Vitamin D, Ergocalciferol, (DRISDOL) 1.25 MG (50000 UT) CAPS capsule Take 1 capsule (50,000 Units total) by mouth every 7 (seven) days. (Patient not taking: Reported on 04/18/2019) 10 capsule 0  . witch hazel-glycerin (TUCKS) pad Apply 1 application topically as needed for hemorrhoids. 40 each 12   No current facility-administered medications for this visit.  Allergies Latex  Physical Exam:  BP 132/87   Pulse 73   Wt 67.1 kg   Breastfeeding Yes   BMI 24.63 kg/m    General:  Alert, oriented and cooperative. Patient is in no acute distress.  Mental Status: Normal mood  and affect. Normal behavior. Normal judgment and thought content.   Respiratory: Normal respiratory effort noted, no problems with respiration noted  Rest of physical exam deferred due to type of encounter  PP Depression Screening:   Edinburgh Postnatal Depression Scale - 04/18/19 1312      Edinburgh Postnatal Depression Scale:  In the Past 7 Days   I have been able to laugh and see the funny side of things.  0    I have looked forward with enjoyment to things.  0    I have blamed myself unnecessarily when things went wrong.  2    I have been anxious or worried for no good reason.  2    I have felt scared or panicky for no good reason.  3    Things have been getting on top of me.  0    I have been so unhappy that I have had difficulty sleeping.  0    I have felt sad or miserable.  1    I have been so unhappy that I have been crying.  1    The thought of harming myself has occurred to me.  0    Edinburgh Postnatal Depression Scale Total  9       Assessment:Patient is a 38 y.o. [id number] who is 6 weeks postpartum from a normal spontaneous vaginal delivery.  She is doing well.   Plan:  1. Postpartum examination following vaginal delivery --Doing well, bonding with infant, good support system.  2. Encounter for counseling regarding contraception --Wants to use natural family planning right now. Has used pills, Mirena in the past.  Discussed LARCs as most effective. Discussed lactational amenorrhea, pumping, supplementing with formula, and more than 4 hours between feedings reduce effectiveness of this method.  Pt states understanding. Husband is considering getting vasectomy.  Pt to call office if she desires to change methods.  RTC 1 year for well woman.  Pap due in 2 years.  I discussed the assessment and treatment plan with the patient. The patient was provided an opportunity to ask questions and all were answered. The patient agreed with the plan and demonstrated an understanding of  the instructions.   The patient was advised to call back or seek an in-person evaluation/go to the ED for any concerning postpartum symptoms.  I provided 12 minutes of face-to-face time during this encounter.   Sharen CounterLisa Leftwich-Kirby, CNM [facility], [facility]

## 2019-05-29 ENCOUNTER — Other Ambulatory Visit: Payer: Self-pay

## 2019-06-01 ENCOUNTER — Other Ambulatory Visit (INDEPENDENT_AMBULATORY_CARE_PROVIDER_SITE_OTHER): Payer: Federal, State, Local not specified - PPO

## 2019-06-01 ENCOUNTER — Other Ambulatory Visit: Payer: Self-pay

## 2019-06-01 DIAGNOSIS — R7989 Other specified abnormal findings of blood chemistry: Secondary | ICD-10-CM | POA: Diagnosis not present

## 2019-06-01 DIAGNOSIS — E559 Vitamin D deficiency, unspecified: Secondary | ICD-10-CM | POA: Diagnosis not present

## 2019-06-01 NOTE — Progress Notes (Signed)
Pt here for lab only. Order given and pt sent to lab.

## 2019-06-02 LAB — VITAMIN D 25 HYDROXY (VIT D DEFICIENCY, FRACTURES): Vit D, 25-Hydroxy: 30 ng/mL (ref 30–100)

## 2019-06-05 ENCOUNTER — Telehealth: Payer: Self-pay | Admitting: *Deleted

## 2019-06-05 NOTE — Telephone Encounter (Signed)
Refill request from pharmacy for Vitamin D2 50,000IU.   Please send to pharmacy if approved.

## 2019-06-06 ENCOUNTER — Other Ambulatory Visit: Payer: Self-pay

## 2019-06-13 ENCOUNTER — Other Ambulatory Visit: Payer: Self-pay

## 2019-06-13 ENCOUNTER — Ambulatory Visit
Admission: RE | Admit: 2019-06-13 | Discharge: 2019-06-13 | Disposition: A | Discharge status: Home / Self Care | Payer: Federal, State, Local not specified - PPO | Source: Ambulatory Visit | Attending: Obstetrics & Gynecology | Admitting: Obstetrics & Gynecology

## 2019-06-13 DIAGNOSIS — Z1231 Encounter for screening mammogram for malignant neoplasm of breast: Secondary | ICD-10-CM

## 2019-07-25 ENCOUNTER — Encounter: Payer: Self-pay | Admitting: Sports Medicine

## 2019-07-25 DIAGNOSIS — H8113 Benign paroxysmal vertigo, bilateral: Secondary | ICD-10-CM

## 2019-07-26 MED ORDER — SCOPOLAMINE 1 MG/3DAYS TD PT72: 1.0000 | MEDICATED_PATCH | TRANSDERMAL | 3 refills | Status: DC

## 2019-07-26 MED ORDER — PREDNISONE 50 MG PO TABS: 50.0000 mg | ORAL_TABLET | Freq: Every day | ORAL | 0 refills | Status: DC

## 2019-07-26 NOTE — Assessment & Plan Note (Signed)
Benign paroxysmal positional vertigo recurrence. Lets do vestibular rehabilitation again, I am also going to add a scopolamine patch to be worn for a few days, we are also going to add prednisone, try to keep the breast-feeding session at least 4 hours after the dose of prednisone. Let me know how things go in 3 to 4 weeks.

## 2019-07-30 ENCOUNTER — Other Ambulatory Visit: Payer: Self-pay | Admitting: Sports Medicine

## 2019-07-30 DIAGNOSIS — H8113 Benign paroxysmal vertigo, bilateral: Secondary | ICD-10-CM

## 2019-07-31 ENCOUNTER — Encounter: Payer: Federal, State, Local not specified - PPO | Admitting: Physical Therapy

## 2019-07-31 ENCOUNTER — Encounter: Payer: Self-pay | Admitting: Physical Therapy

## 2019-07-31 ENCOUNTER — Ambulatory Visit (INDEPENDENT_AMBULATORY_CARE_PROVIDER_SITE_OTHER): Payer: Federal, State, Local not specified - PPO | Admitting: Physical Therapy

## 2019-07-31 ENCOUNTER — Other Ambulatory Visit: Payer: Self-pay

## 2019-07-31 DIAGNOSIS — R2681 Unsteadiness on feet: Secondary | ICD-10-CM | POA: Diagnosis not present

## 2019-07-31 DIAGNOSIS — R42 Dizziness and giddiness: Secondary | ICD-10-CM | POA: Diagnosis not present

## 2019-07-31 NOTE — Patient Instructions (Signed)
Access Code: [email or web address]  URL: [email or web address]  Date: 07/31/2019  Prepared by: Faustino Congress   Exercises  Seated Eye and Head Movement Coordination - Side to Side - 10 reps - 3 sets - 1x daily - 7x weekly  Walking Gaze Stabilization Head Rotation - 1 reps - 1 sets - 10-15 feet - 1x daily - 7x weekly  Walking Gaze Stabilization Head Nod - 1 reps - 1 sets - 10-15 feet - 1x daily - 7x weekly  Seated Gaze Stabilization with Head Nod - 1 sets - 30 Seconds - 2x daily - 7x weekly  Seated Gaze Stabilization with Head Rotation - 1 sets - 30 seconds - 2x daily - 7x weekly  Patient Education  Gaze Stabilization

## 2019-07-31 NOTE — Therapy (Signed)
[facility] [address] [place], [place], [address] Phone: [phone]   Fax:  [phone]  Physical Therapy Evaluation  Patient Details  Name: Dawn SarnaHeather Louise [name] MRN: [id number] Date of Birth: Jul 28, 1981 Referring Provider (PT): Monica Bectonhekkekandam, Thomas J, MD   Encounter Date: 07/31/2019  PT End of Session - 07/31/19 1049    Visit Number  1    Number of Visits  6    Date for PT Re-Evaluation  09/11/19    PT Start Time  0849    PT Stop Time  0927    PT Time Calculation (min)  38 min    Activity Tolerance  Patient tolerated treatment well    Behavior During Therapy  [facility] for tasks assessed/performed       Past Medical History:  Diagnosis Date  . AMA (advanced maternal age) multigravida 35+ 08/12/2018  . Anemia    with pregnancy  . Benign paroxysmal positional vertigo 05/11/2017  . Depression    does not currently take meds  . Goiter, nontoxic, multinodular 10/19/2008   Overview:  Nontoxic Multinodular Goiter Nml labs, US  . Herniated cervical disc 2016  . Hip pain   . Hyperlipidemia 03/15/2017  . Lumbar degenerative disc disease 05/25/2016  . Muscle spasm of back 12/13/2017  . Myofascial pain syndrome 08/27/2014   negative cervical spine MRI, a negative brain MRI, nerve conduction and electromyography, and normal extensive blood work for peripheral neuropathy and myopathy.   . Nontoxic multinodular goiter 10/19/2008   Overview:  Nontoxic Multinodular Goiter  . Overactive bladder 09/24/2014  . Patellofemoral pain syndrome 06/04/2015  . Right tennis elbow   . Subconjunctival hematoma, left 09/14/2017  . Supervision of other high risk pregnancy, antepartum 08/12/2018    Nursing Staff Provider Office Location  [facility] [place] Dating  10/LMP Language  English Anatomy US   normal Flu Vaccine  08/26/18 Genetic Screen  NIPS:Negative  AFP:  Normal  First Screen:  Quad:   TDaP vaccine   12/16/18 Hgb A1C or  GTT Early  Third trimester  Rhogam   NA   LAB RESULTS  Feeding Plan  Breast Blood Type AB/RH(D) POSITIVE/-- (04/18 34740916)  Contraception  NFP Antibody NO ANTIBODIES DETECTED (04/  . Thyroid goiter    benign  . Varicose vein of leg 06/04/2015    Past Surgical History:  Procedure Laterality Date  . TONSILLECTOMY  1986    There were no vitals filed for this visit.   Subjective Assessment - 07/31/19 0851    Subjective  Pt is a 38 y/o female who presents to OPPT for vertigo which began ~ 1 month ago.  Pt with hx of vertigo about 2 years prior, with improvement in symptoms then; and felt vestibular migraine most likely diagnosis.  Pt states meclizine wasn't helpful.    Patient Stated Goals  improve dizziness    Currently in Pain?  No/denies         [facility] - 07/31/19 0853      Assessment   Medical Diagnosis  vertigo    Referring Provider (PT)  Monica Bectonhekkekandam, Thomas J, MD    Onset Date/Surgical Date  --   1 month ago   Hand Dominance  Right    Next MD Visit  PRN    Prior Therapy  at neuro clinic 2 years ago      Precautions   Precautions  None      Restrictions   Weight Bearing Restrictions  No  Balance Screen   Has the patient fallen in the past 6 months  No    Has the patient had a decrease in activity level because of a fear of falling?   No    Is the patient reluctant to leave their home because of a fear of falling?   No      Home Environment   Living Environment  Private residence    Living Arrangements  Spouse/significant other;Children   73 y/o daughter, and 25 month old   Additional Comments  reports min difficulty with stairs; won't carry son down stairs if she's feeling symptomatic; difficulty with driving especially at night      Prior Function   Level of Independence  Independent    Vocation  Full time employment   working from home   [employer]- compensation/service rep; sitting at computer most of day; returned to work from on 7/17    Leisure  watch TV; no regular  exercise at this time      Cognition   Overall Cognitive Status  Within Functional Limits for tasks assessed           Vestibular Assessment - 07/31/19 0900      Vestibular Assessment   General Observation  no symptoms at rest      Symptom Behavior   Subjective history of current problem  see subjective    Type of Dizziness   Imbalance;"Funny feeling in head";Lightheadedness;Unsteady with head/body turns;Spinning   anxious   Frequency of Dizziness  daily    Duration of Dizziness  constant while in provoking environment    Symptom Nature  Motion provoked;Spontaneous    Aggravating Factors  Driving   focusing far away; scrolling on computer   Relieving Factors  Medication;Rest    Progression of Symptoms  No change since onset    History of similar episodes  2 years prior      Oculomotor Exam   Oculomotor Alignment  Normal    Spontaneous  Absent    Gaze-induced   Absent    Smooth Pursuits  Intact    Saccades  Intact   with symptoms     Oculomotor Exam-Fixation Suppressed    Left Head Impulse  mild catch up saccade noted    Right Head Impulse  negative      Vestibulo-Ocular Reflex   VOR 1 Head Only (x 1 viewing)  WNL - mild increase in symptoms      Positional Testing   Dix-Hallpike  Dix-Hallpike Right;Dix-Hallpike Left    Horizontal Canal Testing  Horizontal Canal Right;Horizontal Canal Left      Dix-Hallpike Right   Dix-Hallpike Right Duration  none    Dix-Hallpike Right Symptoms  No nystagmus      Dix-Hallpike Left   Dix-Hallpike Left Duration  none    Dix-Hallpike Left Symptoms  No nystagmus      Horizontal Canal Right   Horizontal Canal Right Duration  none    Horizontal Canal Right Symptoms  Normal      Horizontal Canal Left   Horizontal Canal Left Duration  1-2 sec; with initial roll    Horizontal Canal Left Symptoms  Normal          Objective measurements completed on examination: See above findings.       Vestibular Treatment/Exercise -  07/31/19 1048      Vestibular Treatment/Exercise   Vestibular Treatment Provided  Gaze    Gaze Exercises  X1 Viewing Horizontal;X1 Viewing Vertical;Eye/Head  Exercise Horizontal      X1 Viewing Horizontal   Foot Position  standing with full field stimulus; walking 30'    Time  --   30 sec   Reps  2    Comments  mild increase in symptoms      X1 Viewing Vertical   Foot Position  standing with full field stimulus; walking 30'    Time  --   30 sec   Reps  2    Comments  feels symptoms worse with vertical movements      Eye/Head Exercise Horizontal   Foot Position  seated    Reps  5    Comments  corrective saccades            PT Education - 07/31/19 1049    Education Details  HEP    Person(s) Educated  Patient    Methods  Explanation;Demonstration;Handout    Comprehension  Verbalized understanding;Returned demonstration          PT Long Term Goals - 07/31/19 1314      PT LONG TERM GOAL #1   Title  independent with HEP    Status  New    Target Date  09/11/19      PT LONG TERM GOAL #2   Title  report 75% improvement in symptoms    Status  New    Target Date  09/11/19      PT LONG TERM GOAL #3   Title  report symptoms </= 2/10 dizziness with functional activities for improved symptoms    Status  New    Target Date  09/11/19      PT LONG TERM GOAL #4   Title  n/a      PT LONG TERM GOAL #5   Title  n/a             Plan - 07/31/19 1050    Clinical Impression Statement  Pt is a 38 y/o female who presents to OPPT for return of vertigo x 1 month.  Pt with prior hx of vertigo ~ 2 years ago.  Pt demonstrates negative positional testing, but has some symptoms indicating decreased vestibular input, and visual-vestibular difficluties.  Pt initiated with HEP to address deficits, with vertical head movements most symtptomatic.  Cannot rule out vestibular migraines at this time, and if no benefit seen with PT may need to consider further assessment of this.     Personal Factors and Comorbidities  Past/Current Experience    Examination-Activity Limitations  Stairs;Locomotion Level    Examination-Participation Restrictions  Other   work   Stability/Clinical Decision Making  Evolving/Moderate complexity    Clinical Decision Making  Moderate    Rehab Potential  Good    PT Frequency  1x / week    PT Duration  6 weeks    PT Treatment/Interventions  ADLs/Self Care Home Management;Canalith Repostioning;Balance training;Therapeutic exercise;Therapeutic activities;Vestibular;Functional mobility training;Stair training;Gait training;Neuromuscular re-education;Patient/family education    PT Next Visit Plan  review HEP, reassess PRN, progress gaze; compliant surface activities    PT Home Exercise Plan  Access Code: [email or web address]    Consulted and Agree with Plan of Care  Patient       Patient will benefit from skilled therapeutic intervention in order to improve the following deficits and impairments:  Decreased balance, Dizziness  Visit Diagnosis: 1. Dizziness and giddiness   2. Unsteadiness on feet        Problem List Patient Active Problem List   Diagnosis Date  Noted  . Elevated PTHrP level 03/31/2018  . Muscle spasm of back 12/13/2017  . Subconjunctival hematoma, left 09/14/2017  . Benign paroxysmal positional vertigo 05/11/2017  . Hyperlipidemia 03/15/2017  . Lumbar degenerative disc disease 05/25/2016  . Varicose vein of leg 06/04/2015  . Patellofemoral pain syndrome 06/04/2015  . Overactive bladder 09/24/2014  . Myofascial pain syndrome 08/27/2014  . Family history of breast cancer 09/12/2013  . Nontoxic multinodular goiter 10/19/2008  . Goiter, nontoxic, multinodular 10/19/2008      Clarita CraneStephanie F Naidelin Gugliotta, PT, DPT 07/31/19 1:18 PM     [facility] [address] [place], [place], [address] Phone: [phone]   Fax:  [phone]  Name: Dawn [name] MRN:  [id number] Date of Birth: [birth year]

## 2019-08-06 ENCOUNTER — Other Ambulatory Visit: Payer: Self-pay

## 2019-08-06 ENCOUNTER — Encounter: Payer: Self-pay | Admitting: Emergency Medicine

## 2019-08-06 ENCOUNTER — Emergency Department
Admission: EM | Admit: 2019-08-06 | Discharge: 2019-08-06 | Disposition: A | Discharge status: Home / Self Care | Payer: Federal, State, Local not specified - PPO | Source: Home / Self Care

## 2019-08-06 DIAGNOSIS — J029 Acute pharyngitis, unspecified: Secondary | ICD-10-CM

## 2019-08-06 LAB — POCT RAPID STREP A (OFFICE): Rapid Strep A Screen: NEGATIVE

## 2019-08-06 NOTE — ED Provider Notes (Signed)
[facility]    CSN: [id number] Arrival date & time: 08/06/19  1215      History   Chief Complaint Chief Complaint  Patient presents with  . Sore Throat    HPI Dawn [name] is a 38 y.o. female.   HPI Dawn [name] is a 38 y.o. female presenting to UC with c/o mild sore throat that is itchy this morning.  Occasional Left ear pain.  She notes her 8yo daughter has started in-person school this last week and has had a cough, congestion, eye drainage and low grade fever. Her 56mTruett PernRaSt Thomas HospitaShelby Baptist Ambula40SurgePakistMariah Mi40950moTruett PernRaHigh Point Endoscopy Center IPakistMariah Mi40922moTruett PernRaTwin Rivers Regional Medical CenteHealthsouth Rehabilitation Nichol40SpPakistMariah Mi40970moTruett PernRaDigestive Disease SpecialisPakistMariah Mi40967moTruett PernRaHammond Henry HospitaPenn MeM40[facility] Univ[facility] MePakistMariah Mi40912moTruett PernRaAscension St Michaels HospitaThayerSouth 4PakistMariah Mi40931moTruett PernRaPhs Indian Hospital At Rapid City Sioux SaSurgicaMitche40TaylPakistMariah Mi40939moTruett PernRaAffinity Surgery Center LLMSp40Baylor Scott And WPakistMariah Mi40932moTruett PernRaSouthcoast Hospitals Group - Charlton Memorial HospitaGPakistMariah Mi40972moTruett PernRaOrthopedic Healthcare Ancillary Services LLC Dba Slocum Ambulatory Surgery PakistMariah Mi40947moTruett PernRaSt Joseph'S HospitaBluefield PakistMariah Mi40963moTruett PernRaEndo Surgi Center PSouth Texas SpinSto40SPakistMariah Mi40930moTruett PernRaOcean Spring Surgical And Endoscopy CenteNovamed H40Via ChrisPakistMariah Mi40961moTruett PernRaSame Day Surgery Center Limited Liability PartnPakistMariah Mi40958moTruett PernRaPhysicians Regional - Pine RidgOrthopGatPakistMariah Mi40970moTruett PernRaPalm Beach Surgical Suites LPakistMariah Mi40965moTruett PernRaCenter For Ambulatory Surgery LLGarfGreenwood 40PlPakistMariah Mi40956moTruett PernRaAthens Gastroenterology Endoscopy CentPakistMariah Mi40986moTruett PernRaMercy St 40Encompass Health RehabPakistMariah Mi40975moTruett PernRaUropartners Surgery Center LLCarl VB40SPakistMariah Mi4092moTruett PernRaBay Pines VPakistMariah Mi40942moTruett PernRaWindsor Mill Surgery Center LLDRocky For40AdventhPakistMariah Mi40937moTruett PernRaOrlando Fl Endoscopy Asc LLC Dba Citrus Ambulatory Surgery CenteEncompass Health RehabiliPakistMariah Mi40938moTruett PernRaOhsu Hospital And ClinicFai40NorPakistMariah Mi40941moTruett PernRaNeosho Memorial Regional Medical Cen40GPakistMariah Mi40979moTruett PernRaMease Dunedin HospitaMerc40AthPakistMariah Mi40950moTruett PernRaCleveland Clinic Rehabilitation Hospital, Edwin ShaSt Dav40PakistMariah Mi40938moTruett PernRaVa Medical Center - Palo Alto DivisioWePakistMariah Mi40941moTruett PernRaProvidence Newberg Medical CenteAdventist Health Walla40MoPakistMariah Mi40981eath LarkEndoscopy Center LLCmonth ago and has some eye drainage.  No other known sick contacts. No known exposure to covid but states the school did mention they had 2 positive cases but did not provide any additional information.  Pt denies fever, chills, cough, congestion, HA, n/v/d. No medication taken PTA.   Past Medical History:  Diagnosis Date  . AMA (advanced maternal age) multigravida 35+ 08/12/2018  . Anemia    with pregnancy  . Benign paroxysmal positional vertigo 05/11/2017  . Depression    does not currently take meds  . Goiter, nontoxic, multinodular 10/19/2008   Overview:  Nontoxic Multinodular Goiter Nml labs, US  . Herniated cervical disc 2016  . Hip pain   . Hyperlipidemia 03/15/2017  . Lumbar degenerative disc disease 05/25/2016  . Muscle spasm of back 12/13/2017  . Myofascial pain syndrome 08/27/2014   negative cervical spine MRI, a negative brain MRI, nerve conduction and electromyography, and normal extensive blood work for peripheral neuropathy and myopathy.   . Nontoxic multinodular goiter 10/19/2008   Overview:  Nontoxic Multinodular Goiter  . Overactive bladder 09/24/2014  . Patellofemoral pain syndrome 06/04/2015  . Right tennis elbow   . Subconjunctival hematoma, left 09/14/2017  . Supervision of other high risk pregnancy, antepartum 08/12/2018    Nursing Staff Provider Office Location  [facility] [place] Dating  10/LMP Language  English Anatomy US   normal Flu Vaccine   08/26/18 Genetic Screen  NIPS:Negative  AFP:  Normal  First Screen:  Quad:   TDaP vaccine   12/16/18 Hgb A1C or  GTT Early  Third trimester  Rhogam  NA   LAB RESULTS  Feeding Plan  Breast Blood Type AB/RH(D) POSITIVE/-- (04/18 0916)  Contraception  NFP Antibody NO ANTIBODIES DETECTED (04/  . Thyroid goiter    benign  . Varicose vein of leg 06/04/2015    Patient Active Problem List   Diagnosis Date Noted  . Elevated PTHrP level 03/31/2018  . Muscle spasm of back 12/13/2017  . Subconjunctival hematoma, left 09/14/2017  . Benign paroxysmal positional vertigo 05/11/2017  . Hyperlipidemia 03/15/2017  . Lumbar degenerative disc disease 05/25/2016  . Varicose vein of leg 06/04/2015  . Patellofemoral pain syndrome 06/04/2015  . Overactive bladder 09/24/2014  . Myofascial pain syndrome 08/27/2014  . Family history of breast cancer 09/12/2013  . Nontoxic multinodular goiter 10/19/2008  . Goiter, nontoxic, multinodular 10/19/2008    Past Surgical History:  Procedure Laterality Date  . TONSILLECTOMY  1986    OB History    Gravida  3   Para  2   Term  2   Preterm      AB  1   Living  2     SAB  1   TAB      Ectopic      Multiple  0   Live Births  2  Home Medications    Prior to Admission medications   Medication Sig Start Date End Date Taking? Authorizing Provider  acetaminophen (TYLENOL) 325 MG tablet Take 2 tablets (650 mg total) by mouth every 4 (four) hours as needed (for pain scale < 4). 03/07/19   Calvert CantorWeinhold, Samantha C, CNM  ferrous sulfate 325 (65 FE) MG tablet Take 1 tablet (325 mg total) by mouth 2 (two) times daily with a meal. 02/11/19   Rolm BookbinderNeill, Caroline M, CNM  ibuprofen (ADVIL,MOTRIN) 800 MG tablet Take 1 tablet (800 mg total) by mouth every 8 (eight) hours as needed. 03/07/19   Calvert CantorWeinhold, Samantha C, CNM  meclizine (ANTIVERT) 25 MG tablet TAKE 1 TABLET BY MOUTH THREE TIMES DAILY AS NEEDED FOR DIZZINESS OR NAUSEA 07/31/19   Monica Bectonhekkekandam, Thomas J, MD   pantoprazole (PROTONIX) 40 MG tablet Take 1 tablet (40 mg total) by mouth 2 (two) times daily. 12/30/18   Rolm BookbinderNeill, Caroline M, CNM  predniSONE (DELTASONE) 50 MG tablet Take 1 tablet (50 mg total) by mouth daily. 07/26/19   Monica Bectonhekkekandam, Thomas J, MD  Prenat-FeFum-FePo-FA-Omega 3 (CONCEPT DHA) 53.5-38-1 MG CAPS Take 1 tablet by mouth daily. 08/12/18   Katrinka BlazingSmith, [place], CNM  Vitamin D, Ergocalciferol, (DRISDOL) 1.25 MG (50000 UT) CAPS capsule Take 1 capsule by mouth once a week 06/06/19   Rolm BookbinderNeill, Caroline M, CNM  witch hazel-glycerin (TUCKS) pad Apply 1 application topically as needed for hemorrhoids. Patient not taking: Reported on 07/31/2019 03/07/19   Calvert CantorWeinhold, Samantha C, CNM    Family History Family History  Problem Relation Age of Onset  . Hyperlipidemia Father   . Birth defects Maternal Grandmother   . Cancer Maternal Grandmother        Ovarian  . Birth defects Maternal Grandfather   . Cancer Maternal Grandfather   . Breast cancer Maternal Aunt 42  . Breast cancer Maternal Aunt 50  . Hypertension Sister   . Breast cancer Sister     Social History Social History   Tobacco Use  . Smoking status: Never Smoker  . Smokeless tobacco: Never Used  Substance Use Topics  . Alcohol use: Not Currently    Alcohol/week: 0.0 standard drinks  . Drug use: No     Allergies   Latex   Review of Systems Review of Systems  Constitutional: Negative for chills and fever.  HENT: Positive for ear pain ( left) and sore throat. Negative for congestion, trouble swallowing and voice change.   Respiratory: Negative for cough and shortness of breath.   Cardiovascular: Negative for chest pain and palpitations.  Gastrointestinal: Negative for abdominal pain, diarrhea, nausea and vomiting.  Musculoskeletal: Negative for arthralgias, back pain and myalgias.  Skin: Negative for rash.     Physical Exam Triage Vital Signs ED Triage Vitals  Enc Vitals Group     BP 08/06/19 1252 128/80     Pulse Rate  08/06/19 1252 85     Resp --      Temp 08/06/19 1252 98.1 F (36.7 C)     Temp Source 08/06/19 1252 Oral     SpO2 08/06/19 1252 99 %     Weight 08/06/19 1253 145 lb (65.8 kg)     Height 08/06/19 1253 5\' 5"  (1.651 m)     Head Circumference --      Peak Flow --      Pain Score 08/06/19 1252 0     Pain Loc --      Pain Edu? --      Excl.  in GC? --    No data found.  Updated Vital Signs BP 128/80 (BP Location: Left Arm)   Pulse 85   Temp 98.1 F (36.7 C) (Oral)   Ht 5\' 5"  (1.651 m)   Wt 145 lb (65.8 kg)   LMP 07/23/2019   SpO2 99%   BMI 24.13 kg/m   Visual Acuity Right Eye Distance:   Left Eye Distance:   Bilateral Distance:    Right Eye Near:   Left Eye Near:    Bilateral Near:     Physical Exam Vitals signs and nursing note reviewed.  Constitutional:      Appearance: She is well-developed.  HENT:     Head: Normocephalic and atraumatic.     Right Ear: Tympanic membrane and ear canal normal.     Left Ear: Tympanic membrane and ear canal normal.     Nose: Nose normal.     Mouth/Throat:     Lips: Pink.     Mouth: Mucous membranes are moist.     Pharynx: Oropharynx is clear. Uvula midline. Posterior oropharyngeal erythema present. No pharyngeal swelling, oropharyngeal exudate or uvula swelling.  Neck:     Musculoskeletal: Normal range of motion and neck supple.  Cardiovascular:     Rate and Rhythm: Normal rate and regular rhythm.  Pulmonary:     Effort: Pulmonary effort is normal. No respiratory distress.     Breath sounds: Normal breath sounds. No stridor. No wheezing, rhonchi or rales.  Musculoskeletal: Normal range of motion.  Lymphadenopathy:     Cervical: No cervical adenopathy.  Skin:    General: Skin is warm and dry.  Neurological:     Mental Status: She is alert and oriented to person, place, and time.  Psychiatric:        Behavior: Behavior normal.      UC Treatments / Results  Labs (all labs ordered are listed, but only abnormal results are  displayed) Labs Reviewed  STREP A DNA PROBE  NOVEL CORONAVIRUS, NAA  POCT RAPID STREP A (OFFICE)    EKG   Radiology No results found.  Procedures Procedures (including critical care time)  Medications Ordered in UC Medications - No data to display  Initial Impression / Assessment and Plan / UC Course  I have reviewed the triage vital signs and the nursing notes.  Pertinent labs & imaging results that were available during my care of the patient were reviewed by me and considered in my medical decision making (see chart for details).     Rapid strep: negative Culture sent  Covid-19 test pending AVS provided.  Final Clinical Impressions(s) / UC Diagnoses   Final diagnoses:  Sore throat     Discharge Instructions     Most results have been coming back within about 2 days.   If your results are negative, you will NOT be receiving a phone call. You may check your MyChart account, please see in this packet how to set on up if you do not already have one. There is also an app for phones you can download.   You WILL be notified for POSITIVE results.   Due to concern for possibly having Covid-19, it is advised that you self-isolate at home until test results come back.  If positive, it is recommended you stay isolated for at least 10 days after symptom onset and 24 after last fever without taking medication (whichever is longer).  If you MUST go out, please wear a mask at all times,  limit contact with others.      ED Prescriptions    None     Controlled Substance Prescriptions [place] Controlled Substance Registry consulted? Not Applicable   Tyrell Antonio 08/06/19 1533

## 2019-08-06 NOTE — ED Triage Notes (Signed)
Patient c/o sore throat, throat is itchy, daughter has a cough and low grade fever.  Patient does have on and off left ear pain.

## 2019-08-06 NOTE — Discharge Instructions (Signed)
Most results have been coming back within about 2 days.   If your results are negative, you will NOT be receiving a phone call. You may check your MyChart account, please see in this packet how to set on up if you do not already have one. There is also an app for phones you can download.   You WILL be notified for POSITIVE results.   Due to concern for possibly having Covid-19, it is advised that you self-isolate at home until test results come back.  If positive, it is recommended you stay isolated for at least 10 days after symptom onset and 24 after last fever without taking medication (whichever is longer).  If you MUST go out, please wear a mask at all times, limit contact with others.

## 2019-08-07 ENCOUNTER — Encounter: Payer: Federal, State, Local not specified - PPO | Admitting: Physical Therapy

## 2019-08-07 LAB — STREP A DNA PROBE: Group A Strep Probe: NOT DETECTED

## 2019-08-08 LAB — NOVEL CORONAVIRUS, NAA: SARS-CoV-2, NAA: NOT DETECTED

## 2019-08-12 ENCOUNTER — Encounter: Payer: Self-pay | Admitting: Sports Medicine

## 2019-08-15 ENCOUNTER — Other Ambulatory Visit: Payer: Self-pay

## 2019-08-15 ENCOUNTER — Ambulatory Visit (INDEPENDENT_AMBULATORY_CARE_PROVIDER_SITE_OTHER): Payer: Federal, State, Local not specified - PPO | Admitting: Physical Therapy

## 2019-08-15 ENCOUNTER — Encounter: Payer: Self-pay | Admitting: Physical Therapy

## 2019-08-15 DIAGNOSIS — R42 Dizziness and giddiness: Secondary | ICD-10-CM | POA: Diagnosis not present

## 2019-08-15 DIAGNOSIS — R2681 Unsteadiness on feet: Secondary | ICD-10-CM | POA: Diagnosis not present

## 2019-08-15 NOTE — Therapy (Signed)
[facility] [place] [address] [place], [place], [address] Phone: [phone]   Fax:  [phone]  Physical Therapy Treatment  Patient Details  Name: Dawn [name] MRN: [id number] Date of Birth: Jan 21, 1981 Referring Provider (PT): Monica Becton, MD   Encounter Date: 08/15/2019  PT End of Session - 08/15/19 0942    Visit Number  2    Number of Visits  6    Date for PT Re-Evaluation  09/11/19    PT Start Time  0846    PT Stop Time  0925    PT Time Calculation (min)  39 min    Activity Tolerance  Patient tolerated treatment well    Behavior During Therapy  [facility] for tasks assessed/performed       Past Medical History:  Diagnosis Date  . AMA (advanced maternal age) multigravida 35+ 08/12/2018  . Anemia    with pregnancy  . Benign paroxysmal positional vertigo 05/11/2017  . Depression    does not currently take meds  . Goiter, nontoxic, multinodular 10/19/2008   Overview:  Nontoxic Multinodular Goiter Nml labs, [place]  . Herniated cervical disc 2016  . Hip pain   . Hyperlipidemia 03/15/2017  . Lumbar degenerative disc disease 05/25/2016  . Muscle spasm of back 12/13/2017  . Myofascial pain syndrome 08/27/2014   negative cervical spine MRI, a negative brain MRI, nerve conduction and electromyography, and normal extensive blood work for peripheral neuropathy and myopathy.   . Nontoxic multinodular goiter 10/19/2008   Overview:  Nontoxic Multinodular Goiter  . Overactive bladder 09/24/2014  . Patellofemoral pain syndrome 06/04/2015  . Right tennis elbow   . Subconjunctival hematoma, left 09/14/2017  . Supervision of other high risk pregnancy, antepartum 08/12/2018    Nursing Staff Provider Office Location  [facility] [place] Dating  10/LMP Language  English Anatomy US   normal Flu Vaccine  08/26/18 Genetic Screen  NIPS:Negative  AFP:  Normal  First Screen:  Quad:   TDaP vaccine   12/16/18 Hgb A1C or  GTT Early  Third trimester  Rhogam  NA    LAB RESULTS  Feeding Plan  Breast Blood Type AB/RH(D) POSITIVE/-- (04/18 7628)  Contraception  NFP Antibody NO ANTIBODIES DETECTED (04/  . Thyroid goiter    benign  . Varicose vein of leg 06/04/2015    Past Surgical History:  Procedure Laterality Date  . TONSILLECTOMY  1986    There were no vitals filed for this visit.  Subjective Assessment - 08/15/19 0850    Subjective  missed a couple weeks due to illness.  feels side to side head turns are the more symptomatic.  unsure of triggers at this time; symptoms intermittent    Patient Stated Goals  improve dizziness    Currently in Pain?  No/denies             Vestibular Assessment - 08/15/19 0856      Symptom Behavior   Type of Dizziness   Imbalance;"Funny feeling in head";Lightheadedness;Unsteady with head/body turns;Spinning    Duration of Dizziness  constant while in provoking environment    Symptom Nature  Motion provoked;Spontaneous                Vestibular Treatment/Exercise - 08/15/19 0934      Vestibular Treatment/Exercise   Vestibular Treatment Provided  Gaze    Gaze Exercises  X1 Viewing Horizontal;X1 Viewing Vertical;Eye/Head Exercise Horizontal      X1 Viewing Horizontal   Foot Position  feet apart on  foam; walking outdoors    Time  --   30 sec; walking 40'   Reps  2    Comments  mild increase in symptoms with full field stimulus      X1 Viewing Vertical   Foot Position  feet apart on foam; walking outdoors    Time  --   30 sec; walking 40'   Reps  2    Comments  mild increase in symptoms with full field stimulus         Balance Exercises - 08/15/19 0936      Balance Exercises: Standing   Standing Eyes Closed  Foam/compliant surface;Narrow base of support (BOS);Head turns    Partial Tandem Stance  Foam/compliant surface;Eyes closed;3 reps;10 secs;Eyes open   EO with head turns            PT Long Term Goals - 07/31/19 1314      PT LONG TERM GOAL #1   Title  independent  with HEP    Status  New    Target Date  09/11/19      PT LONG TERM GOAL #2   Title  report 75% improvement in symptoms    Status  New    Target Date  09/11/19      PT LONG TERM GOAL #3   Title  report symptoms </= 2/10 dizziness with functional activities for improved symptoms    Status  New    Target Date  09/11/19      PT LONG TERM GOAL #4   Title  n/a      PT LONG TERM GOAL #5   Title  n/a            Plan - 08/15/19 0942    Clinical Impression Statement  Pt continues to report variable symptoms, but able to progress exercises today.  Discussed trying low Na+ and possible nasal steroid spray to help with fluid and congestion to see if this improves symptoms.  Pt verbalized understanding.  Overall progressing well towards goals.    Personal Factors and Comorbidities  Past/Current Experience    Examination-Activity Limitations  Stairs;Locomotion Level    Examination-Participation Restrictions  Other   work   Conservation officer, historic buildingstability/Clinical Decision Making  Evolving/Moderate complexity    Rehab Potential  Good    PT Frequency  1x / week    PT Duration  6 weeks    PT Treatment/Interventions  ADLs/Self Care Home Management;Canalith Repostioning;Balance training;Therapeutic exercise;Therapeutic activities;Vestibular;Functional mobility training;Stair training;Gait training;Neuromuscular re-education;Patient/family education    PT Next Visit Plan  review HEP, reassess PRN, progress gaze; compliant surface activities    PT Home Exercise Plan  Access Code: [email or web address]    Consulted and Agree with Plan of Care  Patient       Patient will benefit from skilled therapeutic intervention in order to improve the following deficits and impairments:  Decreased balance, Dizziness  Visit Diagnosis: Dizziness and giddiness  Unsteadiness on feet     Problem List Patient Active Problem List   Diagnosis Date Noted  . Elevated PTHrP level 03/31/2018  . Muscle spasm of back 12/13/2017  .  Subconjunctival hematoma, left 09/14/2017  . Benign paroxysmal positional vertigo 05/11/2017  . Hyperlipidemia 03/15/2017  . Lumbar degenerative disc disease 05/25/2016  . Varicose vein of leg 06/04/2015  . Patellofemoral pain syndrome 06/04/2015  . Overactive bladder 09/24/2014  . Myofascial pain syndrome 08/27/2014  . Family history of breast cancer 09/12/2013  . Nontoxic multinodular goiter 10/19/2008  . Goiter,  nontoxic, multinodular 10/19/2008      Laureen Abrahams, PT, DPT 08/15/19 9:44 AM     [facility] [place], [place], [address] Phone: [phone]   Fax:  [phone]  Name: Raynette Arras MRN: [id number] Date of Birth: 1981/08/17

## 2019-08-15 NOTE — Patient Instructions (Signed)
Access Code: [email or web address]  URL: [email or web address]  Date: 08/15/2019  Prepared by: Faustino Congress   Exercises  Seated Eye and Head Movement Coordination - Side to Side - 10 reps - 3 sets - 1x daily - 7x weekly  Walking Gaze Stabilization Head Rotation - 1 reps - 1 sets - 10-15 feet - 1x daily - 7x weekly  Walking Gaze Stabilization Head Nod - 1 reps - 1 sets - 10-15 feet - 1x daily - 7x weekly  Romberg Stance with Head Nods on Foam Pad - 1 reps - 1 sets - 10 rotations each way - 2x daily - 7x weekly  Standing Gaze Stabilization with Head Rotation - 1 sets - 30 Seconds - 2x daily - 7x weekly  Patient Education  Gaze Stabilization

## 2019-08-17 ENCOUNTER — Other Ambulatory Visit: Payer: Self-pay

## 2019-08-22 ENCOUNTER — Encounter: Payer: Federal, State, Local not specified - PPO | Admitting: Physical Therapy

## 2019-08-28 ENCOUNTER — Ambulatory Visit (INDEPENDENT_AMBULATORY_CARE_PROVIDER_SITE_OTHER): Payer: Federal, State, Local not specified - PPO | Admitting: Physical Therapy

## 2019-08-28 ENCOUNTER — Other Ambulatory Visit: Payer: Self-pay

## 2019-08-28 ENCOUNTER — Encounter: Payer: Self-pay | Admitting: Physical Therapy

## 2019-08-28 ENCOUNTER — Telehealth: Payer: Self-pay | Admitting: Physical Therapy

## 2019-08-28 DIAGNOSIS — R2681 Unsteadiness on feet: Secondary | ICD-10-CM

## 2019-08-28 DIAGNOSIS — M5136 Other intervertebral disc degeneration, lumbar region: Secondary | ICD-10-CM

## 2019-08-28 DIAGNOSIS — R42 Dizziness and giddiness: Secondary | ICD-10-CM

## 2019-08-28 DIAGNOSIS — M51369 Other intervertebral disc degeneration, lumbar region without mention of lumbar back pain or lower extremity pain: Secondary | ICD-10-CM

## 2019-08-28 NOTE — Telephone Encounter (Signed)
Dr. Victorino Sparrow mentioned you recommended a PT referral for LBP for her. I see the communication for this, but it doesn't look like the referral was actually placed (ony one I see is for BPPV).  Can you place a referral for LBP please?  She's doing pretty well from a vestibular standpoint at this time and we'll continue to treat that PRN.   Thanks- Laureen Abrahams, PT, DPT 08/28/19 11:32 AM

## 2019-08-28 NOTE — Telephone Encounter (Signed)
Kelsi- The only referral I see is for BPPV.  We would need a new referral for low back pain.  Thanks! Colletta [place]

## 2019-08-28 NOTE — Therapy (Signed)
[place], [place], [address] Phone: [phone]   Fax:  [phone]  Physical Therapy Treatment  Patient Details  Name: Dawn [name] MRN: [id number] Date of Birth: Feb 08, 1981 Referring Provider (PT): Silverio Decamp, MD   Encounter Date: 08/28/2019  PT End of Session - 08/28/19 0846    Visit Number  3    Number of Visits  6    Date for PT Re-Evaluation  09/11/19    PT Start Time  0802    PT Stop Time  0843    PT Time Calculation (min)  41 min    Activity Tolerance  Patient tolerated treatment well    Behavior During Therapy  [facility] for tasks assessed/performed       Past Medical History:  Diagnosis Date  . AMA (advanced maternal age) multigravida 35+ 08/12/2018  . Anemia    with pregnancy  . Benign paroxysmal positional vertigo 05/11/2017  . Depression    does not currently take meds  . Goiter, nontoxic, multinodular 10/19/2008   Overview:  Nontoxic Multinodular Goiter Nml labs, [place]  . Herniated cervical disc 2016  . Hip pain   . Hyperlipidemia 03/15/2017  . Lumbar degenerative disc disease 05/25/2016  . Muscle spasm of back 12/13/2017  . Myofascial pain syndrome 08/27/2014   negative cervical spine MRI, a negative brain MRI, nerve conduction and electromyography, and normal extensive blood work for peripheral neuropathy and myopathy.   . Nontoxic multinodular goiter 10/19/2008   Overview:  Nontoxic Multinodular Goiter  . Overactive bladder 09/24/2014  . Patellofemoral pain syndrome 06/04/2015  . Right tennis elbow   . Subconjunctival hematoma, left 09/14/2017  . Supervision of other high risk pregnancy, antepartum 08/12/2018    Nursing Staff Provider Office Location  [facility] [place] Dating  10/LMP Language  English Anatomy US   normal Flu Vaccine  08/26/18 Genetic Screen  NIPS:Negative  AFP:  Normal  First Screen:  Quad:   TDaP vaccine   12/16/18 Hgb A1C or  GTT Early  Third trimester  Rhogam   NA   LAB RESULTS  Feeding Plan  Breast Blood Type AB/RH(D) POSITIVE/-- (04/18 9476)  Contraception  NFP Antibody NO ANTIBODIES DETECTED (04/  . Thyroid goiter    benign  . Varicose vein of leg 06/04/2015    Past Surgical History:  Procedure Laterality Date  . TONSILLECTOMY  1986    There were no vitals filed for this visit.  Subjective Assessment - 08/28/19 0804    Subjective  hasn't had a lot of time to do much of the outdoor walking.  spent a few hours driving the boat prior weekend and reports no dizziness.  didn't take meds this morning - overall doing well with driving and feels symptoms are improving.  eye exam was fine.    Patient Stated Goals  improve dizziness    Currently in Pain?  No/denies             Vestibular Assessment - 08/28/19 0806      Vestibular Assessment   General Observation  feels 1-2/10      Symptom Behavior   Type of Dizziness   --   "like a headache is about to start"              [facility] Adult PT Treatment/Exercise - 08/28/19 0845      Manual Therapy   Manual Therapy  Soft tissue mobilization    Soft tissue mobilization  cervical paraspinals  and suboccipitals      Vestibular Treatment/Exercise - 08/28/19 0827      Vestibular Treatment/Exercise   Vestibular Treatment Provided  Gaze      X1 Viewing Horizontal   Foot Position  feet apart - bounce on rebounder    Time  --   30 sec   Reps  1    Comments  mild increase in symptoms      X1 Viewing Vertical   Foot Position  feet apart - bounce on rebounder    Time  --   30 sec   Reps  1    Comments  mild increase in symptoms      Trigger Point Dry Needling - 08/28/19 0845    Consent Given?  Yes    Education Handout Provided  Previously provided    Muscles Treated Head and Neck  Suboccipitals;Semispinalis capitus;Splenius capitus    SubOccipitals Response  Twitch response elicited;Palpable increased muscle length    Splenius capitus Response  Twitch reponse  elicited;Palpable increased muscle length    Semispinalis capitus Response  Palpable increased muscle length;Twitch reponse elicited        Balance Exercises - 08/28/19 0830      Balance Exercises: Standing   Standing Eyes Closed  Foam/compliant surface;Narrow base of support (BOS);Head turns    Other Standing Exercises  bounce on rebounder with EC-narrow BOS; amb with ball toss vertical/horizontal; as well as carrying ball with circles and infinity signs             PT Long Term Goals - 07/31/19 1314      PT LONG TERM GOAL #1   Title  independent with HEP    Status  New    Target Date  09/11/19      PT LONG TERM GOAL #2   Title  report 75% improvement in symptoms    Status  New    Target Date  09/11/19      PT LONG TERM GOAL #3   Title  report symptoms </= 2/10 dizziness with functional activities for improved symptoms    Status  New    Target Date  09/11/19      PT LONG TERM GOAL #4   Title  n/a      PT LONG TERM GOAL #5   Title  n/a            Plan - 08/28/19 0846    Clinical Impression Statement  Pt continues to have intermittent symptoms, but reports functional improvements and increased activity with decreased intensity of symtpoms.  Progressing well with PT.    Personal Factors and Comorbidities  Past/Current Experience    Examination-Activity Limitations  Stairs;Locomotion Level    Examination-Participation Restrictions  Other   work   Conservation officer, historic buildingstability/Clinical Decision Making  Evolving/Moderate complexity    Rehab Potential  Good    PT Frequency  1x / week    PT Duration  6 weeks    PT Treatment/Interventions  ADLs/Self Care Home Management;Canalith Repostioning;Balance training;Therapeutic exercise;Therapeutic activities;Vestibular;Functional mobility training;Stair training;Gait training;Neuromuscular re-education;Patient/family education    PT Next Visit Plan  review HEP, reassess PRN, progress gaze; compliant surface activities; needs back eval     PT Home Exercise Plan  Access Code: [email or web address]    Consulted and Agree with Plan of Care  Patient       Patient will benefit from skilled therapeutic intervention in order to improve the following deficits and impairments:  Decreased balance, Dizziness  Visit Diagnosis: Dizziness  and giddiness  Unsteadiness on feet     Problem List Patient Active Problem List   Diagnosis Date Noted  . Elevated PTHrP level 03/31/2018  . Muscle spasm of back 12/13/2017  . Subconjunctival hematoma, left 09/14/2017  . Benign paroxysmal positional vertigo 05/11/2017  . Hyperlipidemia 03/15/2017  . Lumbar degenerative disc disease 05/25/2016  . Varicose vein of leg 06/04/2015  . Patellofemoral pain syndrome 06/04/2015  . Overactive bladder 09/24/2014  . Myofascial pain syndrome 08/27/2014  . Family history of breast cancer 09/12/2013  . Nontoxic multinodular goiter 10/19/2008  . Goiter, nontoxic, multinodular 10/19/2008      Clarita Crane, PT, DPT 08/28/19 8:48 AM    [facility] [address] [place], [place], [address] Phone: [phone]   Fax:  [phone]  Name: Dawn [name] MRN: [id number] Date of Birth: December 04, 1981

## 2019-08-28 NOTE — Telephone Encounter (Signed)
Signed, thank you

## 2019-08-28 NOTE — Telephone Encounter (Signed)
New referral pended, routing to PCP

## 2019-08-28 NOTE — Telephone Encounter (Signed)
There was a referral placed for this in August 2020. It was sent internally to [place]. Can this be used or do you need a new referral?

## 2019-08-30 ENCOUNTER — Telehealth: Payer: Self-pay | Admitting: *Deleted

## 2019-08-30 NOTE — Telephone Encounter (Signed)
Pt is scheduled for Vit D blood draw on 09/05/19

## 2019-08-30 NOTE — Telephone Encounter (Signed)
-----   Message from Guss Bunde, MD sent at 08/29/2019  8:43 PM EDT ----- Pt needs new Vitamin D level drawn.  Can you call her for a lab drawn and then we can order the correct vitamin D dose.

## 2019-09-05 ENCOUNTER — Other Ambulatory Visit: Payer: Self-pay

## 2019-09-05 ENCOUNTER — Ambulatory Visit (INDEPENDENT_AMBULATORY_CARE_PROVIDER_SITE_OTHER): Payer: Federal, State, Local not specified - PPO | Admitting: Physical Therapy

## 2019-09-05 ENCOUNTER — Encounter: Payer: Self-pay | Admitting: Physical Therapy

## 2019-09-05 ENCOUNTER — Other Ambulatory Visit (INDEPENDENT_AMBULATORY_CARE_PROVIDER_SITE_OTHER): Payer: Federal, State, Local not specified - PPO | Admitting: *Deleted

## 2019-09-05 DIAGNOSIS — E559 Vitamin D deficiency, unspecified: Secondary | ICD-10-CM | POA: Diagnosis not present

## 2019-09-05 DIAGNOSIS — R42 Dizziness and giddiness: Secondary | ICD-10-CM

## 2019-09-05 DIAGNOSIS — M545 Low back pain, unspecified: Secondary | ICD-10-CM

## 2019-09-05 DIAGNOSIS — R2681 Unsteadiness on feet: Secondary | ICD-10-CM | POA: Diagnosis not present

## 2019-09-05 DIAGNOSIS — M546 Pain in thoracic spine: Secondary | ICD-10-CM

## 2019-09-05 NOTE — Progress Notes (Signed)
Vitamin D only blood draw

## 2019-09-05 NOTE — Patient Instructions (Signed)
Access Code: [email or web address]  URL: [email or web address]  Date: 09/05/2019  Prepared by: Faustino Congress   Exercises  Supine Double Knee to Chest - 3 reps - 1 sets - 30 sec hold - 2x daily - 7x weekly  Ardine Eng Pose - 3 reps - 1 sets - 30 sec hold - 2x daily - 7x weekly  Cat Cow - 10 reps - 1 sets - 5 sec hold - 2x daily - 7x weekly  Seated Flexion Stretch with Swiss Ball - 5 reps - 1 sets - 10-15 sec hold - 2x daily - 7x weekly  Seated Thoracic Flexion and Rotation with Swiss Ball - 5 reps - 1 sets - 10-15 sec hold - 2x daily - 7x weekly  Patient Education  Trigger Point Dry Needling

## 2019-09-05 NOTE — Therapy (Addendum)
[facility] [place], [place], [address] Phone: [phone]   Fax:  [phone]  Physical Therapy Re-Evaluation  Patient Details  Name: Dawn [name] MRN: [id number] Date of Birth: 26-May-1981 Referring Provider (PT): Silverio Decamp, MD   Encounter Date: 09/05/2019  PT End of Session - 09/05/19 1236    Visit Number  4    Number of Visits  16    Date for PT Re-Evaluation  10/17/19    PT Start Time  1540    PT Stop Time  1226    PT Time Calculation (min)  41 min    Activity Tolerance  Patient tolerated treatment well    Behavior During Therapy  [facility] for tasks assessed/performed       Past Medical History:  Diagnosis Date  . AMA (advanced maternal age) multigravida 35+ 08/12/2018  . Anemia    with pregnancy  . Benign paroxysmal positional vertigo 05/11/2017  . Depression    does not currently take meds  . Goiter, nontoxic, multinodular 10/19/2008   Overview:  Nontoxic Multinodular Goiter Nml labs, [place]  . Herniated cervical disc 2016  . Hip pain   . Hyperlipidemia 03/15/2017  . Lumbar degenerative disc disease 05/25/2016  . Muscle spasm of back 12/13/2017  . Myofascial pain syndrome 08/27/2014   negative cervical spine MRI, a negative brain MRI, nerve conduction and electromyography, and normal extensive blood work for peripheral neuropathy and myopathy.   . Nontoxic multinodular goiter 10/19/2008   Overview:  Nontoxic Multinodular Goiter  . Overactive bladder 09/24/2014  . Patellofemoral pain syndrome 06/04/2015  . Right tennis elbow   . Subconjunctival hematoma, left 09/14/2017  . Supervision of other high risk pregnancy, antepartum 08/12/2018    Nursing Staff Provider Office Location  [facility] Dating  10/LMP Language  English Anatomy US   normal Flu Vaccine  08/26/18 Genetic Screen  NIPS:Negative  AFP:  Normal  First Screen:  Quad:   TDaP vaccine   12/16/18 Hgb A1C or  GTT Early  Third trimester   Rhogam  NA   LAB RESULTS  Feeding Plan  Breast Blood Type AB/RH(D) POSITIVE/-- (04/18 0867)  Contraception  NFP Antibody NO ANTIBODIES DETECTED (04/  . Thyroid goiter    benign  . Varicose vein of leg 06/04/2015    Past Surgical History:  Procedure Laterality Date  . TONSILLECTOMY  1986    There were no vitals filed for this visit.   Subjective Assessment - 09/05/19 1144    Subjective  Pt returns to PT with new referral for LBP.  Pt reports hx of muscle spasms limiting ability to get out of bed.    Patient Stated Goals  improve dizziness and back pain    Currently in Pain?  Yes    Pain Score  2    up to 7/10, at best 0/10   Pain Location  Back    Pain Orientation  Lower;Mid;Medial    Pain Descriptors / Indicators  Aching    Pain Type  Acute pain;Chronic pain    Pain Onset  1 to 4 weeks ago    Pain Frequency  Intermittent    Aggravating Factors   spasms: bending forward to pick up object    Pain Relieving Factors  muscle relaxers, cortisone injection         [facility] PT Assessment - 09/05/19 1149      Assessment   Medical Diagnosis  vertigo/ LBP    Referring  Provider (PT)  Monica Becton, MD    Onset Date/Surgical Date  08/15/19   approx   Hand Dominance  Right    Next MD Visit  PRN    Prior Therapy  at neuro clinic 2 years ago for vertigo; previously following initial back injury      Precautions   Precautions  None      Restrictions   Weight Bearing Restrictions  No      Balance Screen   Has the patient fallen in the past 6 months  No    Has the patient had a decrease in activity level because of a fear of falling?   No    Is the patient reluctant to leave their home because of a fear of falling?   No      Prior Function   Vocation  Full time employment   working from home   [employer]- compensation/service rep; sitting at computer most of day; returned to work from on 7/17    Leisure  watch TV; no regular exercise at this time       Posture/Postural Control   Posture/Postural Control  Postural limitations    Postural Limitations  Forward head;Rounded Shoulders      ROM / Strength   AROM / PROM / Strength  AROM;Strength      AROM   Overall AROM Comments  lumbar ROM WNL with mild increase in pain with repeated extension      Strength   Strength Assessment Site  Hip;Knee    Right/Left Hip  Right;Left    Right Hip Flexion  5/5    Right Hip Extension  5/5    Right Hip ABduction  4/5    Left Hip Flexion  5/5    Left Hip Extension  5/5    Left Hip ABduction  4/5    Right/Left Knee  Right;Left    Right Knee Flexion  5/5    Right Knee Extension  5/5    Left Knee Flexion  5/5    Left Knee Extension  5/5      Flexibility   Soft Tissue Assessment /Muscle Length  yes    Piriformis  tightness bil      Palpation   Spinal mobility  tightness and discomfort CPA thoracic and lumbar spine     Palpation comment  trigger points noted in thoracic/lumbar paraspinals; glutes and piriformis      Special Tests    Special Tests  Lumbar    Lumbar Tests  Slump Test      Slump test   Findings  Negative                Objective measurements completed on examination: See above findings.      [facility] Adult PT Treatment/Exercise - 09/05/19 1149      Exercises   Exercises  Lumbar      Lumbar Exercises: Stretches   Quadruped Mid Back Stretch  3 reps;20 seconds    Quadruped Mid Back Stretch Limitations  childs pose      Lumbar Exercises: Quadruped   Madcat/Old Horse  5 reps      Manual Therapy   Manual Therapy  Soft tissue mobilization    Manual therapy comments  skilled palpation and monitoring of soft tissue during DN    Soft tissue mobilization  thoracic parspinals into QL bil       Trigger Point Dry Needling - 09/05/19 1229    Consent  Given?  Yes    Education Handout Provided  Previously provided    Muscles Treated Back/Hip  Lumbar multifidi;Erector spinae    Electrical Stimulation Performed with Dry  Needling  Yes    E-stim with Dry Needling Details  thoracic multifidi to tolerance x 5 min    Erector spinae Response  Twitch response elicited;Palpable increased muscle length   thoracic paraspinals   Lumbar multifidi Response  Twitch response elicited;Palpable increased muscle length   thoracic multifidi               PT Long Term Goals - 09/05/19 1237      PT LONG TERM GOAL #1   Title  independent with HEP    Status  New    Target Date  10/17/19      PT LONG TERM GOAL #2   Title  report 75% improvement in symptoms    Baseline  9/22: reports minimal vertigo symptoms    Status  Achieved      PT LONG TERM GOAL #3   Title  report symptoms </= 2/10 dizziness with functional activities for improved symptoms    Status  On-going    Target Date  10/17/19      PT LONG TERM GOAL #4   Title  report back pain < 3/10 with activity for improved function    Status  New    Target Date  10/17/19      PT LONG TERM GOAL #5   Title  report 50% improvement in LBP and spasms for improved function    Status  New    Target Date  10/17/19             Plan - 09/05/19 1239    Clinical Impression Statement  Pt presents today for re-eval with new referral for LBP.  Pt with acute exacerbation of chronic LBP and spasms. She demonstrates hypomobility of thoracic and lumbar spine with increased trigger point in paraspinals and QL.  Positive response to DN and manual therapy today, and issued HEP to address tightness.  Will benefit from PT to address deficits listed.    Personal Factors and Comorbidities  Past/Current Experience    Examination-Activity Limitations  Stairs;Locomotion Level;Carry;Caring for [employer];Sleep    Examination-Participation Restrictions  Other   work   Stability/Clinical Decision Making  Evolving/Moderate complexity    Clinical Decision Making  Low    Rehab Potential  Good    PT Frequency  2x / week    PT Duration  6 weeks    PT Treatment/Interventions   ADLs/Self Care Home Management;Canalith Repostioning;Balance training;Therapeutic exercise;Therapeutic activities;Vestibular;Functional mobility training;Stair training;Gait training;Neuromuscular re-education;Patient/family education;Cryotherapy;Electrical Stimulation;Ultrasound;Moist Heat;Traction;Manual techniques;Dry needling;Taping    PT Next Visit Plan  review HEP, treat vertigo PRN; assess response to DN and manual, modalites/manual/DN PRN    PT Home Exercise Plan  Access Code: [email or web address]    Consulted and Agree with Plan of Care  Patient       Patient will benefit from skilled therapeutic intervention in order to improve the following deficits and impairments:  Decreased balance, Dizziness, Decreased strength, Postural dysfunction, Hypomobility, Impaired flexibility, Pain, Increased muscle spasms, Increased fascial restricitons  Visit Diagnosis: Acute midline low back pain without sciatica - Plan: PT plan of care cert/re-cert  Pain in thoracic spine - Plan: PT plan of care cert/re-cert  Dizziness and giddiness - Plan: PT plan of care cert/re-cert  Unsteadiness on feet - Plan: PT plan of care cert/re-cert     Problem List  Patient Active Problem List   Diagnosis Date Noted  . Elevated PTHrP level 03/31/2018  . Muscle spasm of back 12/13/2017  . Subconjunctival hematoma, left 09/14/2017  . Benign paroxysmal positional vertigo 05/11/2017  . Hyperlipidemia 03/15/2017  . Lumbar degenerative disc disease 05/25/2016  . Varicose vein of leg 06/04/2015  . Patellofemoral pain syndrome 06/04/2015  . Overactive bladder 09/24/2014  . Myofascial pain syndrome 08/27/2014  . Family history of breast cancer 09/12/2013  . Nontoxic multinodular goiter 10/19/2008  . Goiter, nontoxic, multinodular 10/19/2008      Clarita Crane, PT, DPT 09/05/19 12:45 PM     [facility] [address] [place], [place], [address] Phone:  [phone]   Fax:  [phone]  Name: Dawn [name] MRN: [id number] Date of Birth: [birth year]

## 2019-09-06 ENCOUNTER — Telehealth: Payer: Self-pay | Admitting: *Deleted

## 2019-09-06 LAB — VITAMIN D 25 HYDROXY (VIT D DEFICIENCY, FRACTURES): Vit D, 25-Hydroxy: 34 ng/mL (ref 30–100)

## 2019-09-06 NOTE — Telephone Encounter (Signed)
Pt notified of Dr Leggett's recommendation to take 800 units of Vitamin D daily OTC.  Med list was adjusted.  Pt to return in 3 months for Vit D recheck if she desires.

## 2019-09-06 NOTE — Telephone Encounter (Signed)
-----   Message from Guss Bunde, MD sent at 09/06/2019  1:07 PM EDT ----- Now that she is back in the normal range the recommendation is 800 units per day which is OTC.  Can you change her med list and let her know.   We can do a follow up level in 3 months if she is worried it won't be enough. Thanks,  KHL ----- Message ----- From: Asencion Islam, RN Sent: 09/06/2019   9:18 AM EDT To: Guss Bunde, MD  Hello yourself stranger, The last time she took RX was 3 weeks ago and no she does take any OTC Vit D. ----- Message ----- From: Guss Bunde, MD Sent: 09/06/2019   8:25 AM EDT To: Willene Hatchet Clinical Pool  HI,  Can you tell me the last time she took the prescription Vit D and if she is taking any OTC Vitamin D now or recently (including MVIs)  Thanks,

## 2019-09-08 ENCOUNTER — Other Ambulatory Visit: Payer: Self-pay

## 2019-09-08 ENCOUNTER — Ambulatory Visit (INDEPENDENT_AMBULATORY_CARE_PROVIDER_SITE_OTHER): Payer: Federal, State, Local not specified - PPO | Admitting: Physical Therapy

## 2019-09-08 DIAGNOSIS — M545 Low back pain, unspecified: Secondary | ICD-10-CM

## 2019-09-08 DIAGNOSIS — M546 Pain in thoracic spine: Secondary | ICD-10-CM

## 2019-09-08 NOTE — Addendum Note (Signed)
Addended by: Faustino Congress F on: 09/08/2019 12:05 PM   Modules accepted: Orders

## 2019-09-08 NOTE — Patient Instructions (Signed)
Access Code: [email or web address]  URL: [email or web address]  Date: 09/08/2019  Prepared by: Kerin Perna   Exercises  Added: Standing Hip Abduction - 10 reps - 3 sets - 1x daily - 7x weekly  Sidelying Open Book Thoracic Lumbar Rotation and Extension - 10 reps - 1-2 sets - 1x daily - 7x weekly  Supine Lower Trunk Rotation - 2-3 reps - 1 sets - 15 hold - 1x daily - 7x weekly

## 2019-09-08 NOTE — Therapy (Addendum)
[facility] [address] [place], [place], [address] Phone: [phone]   Fax:  [phone]  Physical Therapy Treatment  Patient Details  Name: Dawn [name] MRN: [id number] Date of Birth: Sep 20, 1981 Referring Provider (PT): Monica Becton, MD   Encounter Date: 09/08/2019  PT End of Session - 09/08/19 0933    Visit Number  5    Number of Visits  16    Date for PT Re-Evaluation  10/17/19    PT Start Time  0849    PT Stop Time  0931    PT Time Calculation (min)  42 min    Activity Tolerance  Patient tolerated treatment well;No increased pain    Behavior During Therapy  WFL for tasks assessed/performed       Past Medical History:  Diagnosis Date  . AMA (advanced maternal age) multigravida 35+ 08/12/2018  . Anemia    with pregnancy  . Benign paroxysmal positional vertigo 05/11/2017  . Depression    does not currently take meds  . Goiter, nontoxic, multinodular 10/19/2008   Overview:  Nontoxic Multinodular Goiter Nml labs, [place]  . Herniated cervical disc 2016  . Hip pain   . Hyperlipidemia 03/15/2017  . Lumbar degenerative disc disease 05/25/2016  . Muscle spasm of back 12/13/2017  . Myofascial pain syndrome 08/27/2014   negative cervical spine MRI, a negative brain MRI, nerve conduction and electromyography, and normal extensive blood work for peripheral neuropathy and myopathy.   . Nontoxic multinodular goiter 10/19/2008   Overview:  Nontoxic Multinodular Goiter  . Overactive bladder 09/24/2014  . Patellofemoral pain syndrome 06/04/2015  . Right tennis elbow   . Subconjunctival hematoma, left 09/14/2017  . Supervision of other high risk pregnancy, antepartum 08/12/2018    Nursing Staff Provider Office Location  [facility] Dating  10/LMP Language  English Anatomy US   normal Flu Vaccine  08/26/18 Genetic Screen  NIPS:Negative  AFP:  Normal  First Screen:  Quad:   TDaP vaccine   12/16/18 Hgb A1C or  GTT Early  Third  trimester  Rhogam  NA   LAB RESULTS  Feeding Plan  Breast Blood Type AB/RH(D) POSITIVE/-- (04/18 3568)  Contraception  NFP Antibody NO ANTIBODIES DETECTED (04/  . Thyroid goiter    benign  . Varicose vein of leg 06/04/2015    Past Surgical History:  Procedure Laterality Date  . TONSILLECTOMY  1986    There were no vitals filed for this visit.  Subjective Assessment - 09/08/19 0852    Subjective  Pt reports she did well with DN last session.  She has had an easier time getting out of bed.    Patient Stated Goals  improve dizziness and back pain    Currently in Pain?  Yes    Pain Score  2     Pain Location  Back    Pain Orientation  Mid;Lower    Pain Descriptors / Indicators  Aching    Aggravating Factors   bending forward    Pain Relieving Factors  stretches         [facility] PT Assessment - 09/08/19 0001      Assessment   Medical Diagnosis  vertigo/ LBP    Referring Provider (PT)  Monica Becton, MD    Onset Date/Surgical Date  08/15/19   approx   Hand Dominance  Right    Next MD Visit  PRN    Prior Therapy  at neuro clinic 2 years ago  for vertigo; previously following initial back injury       [facility] Adult PT Treatment/Exercise - 09/08/19 0001      Self-Care   Self-Care  Other Self-Care Comments    Other Self-Care Comments   Pt educated on self massage with ball to paraspinals; pt returned demo with cues.       Lumbar Exercises: Stretches   Passive Hamstring Stretch  Right;Left;2 reps;20 seconds    Lower Trunk Rotation  3 reps;10 seconds   arms in T   Quadruped Mid Back Stretch  3 reps;20 seconds    Quadruped Mid Back Stretch Limitations  childs pose    Piriformis Stretch  Right;Left;2 reps;20 seconds      Lumbar Exercises: Aerobic   Nustep  L4: 5 min       Lumbar Exercises: Sidelying   Hip Abduction  Right;Left;15 reps   cues to slow speed   Hip Abduction Limitations  leading with heel    Other Sidelying Lumbar Exercises  open book - thoracic rotation  with red band x 10 reps each side.       Lumbar Exercises: Quadruped   Madcat/Old Horse  5 reps      Manual Therapy   Soft tissue mobilization  STM and TPR to bilat thoracic parspinals into QL bil             PT Education - 09/08/19 0932    Education Details  self massage;   HEP    Person(s) Educated  Patient    Methods  Explanation;Demonstration;Handout;Verbal cues;Tactile cues    Comprehension  Returned demonstration;Verbalized understanding          PT Long Term Goals - 09/05/19 1237      PT LONG TERM GOAL #1   Title  independent with HEP    Status  New    Target Date  10/17/19      PT LONG TERM GOAL #2   Title  report 75% improvement in symptoms    Baseline  9/22: reports minimal vertigo symptoms    Status  Achieved      PT LONG TERM GOAL #3   Title  report symptoms </= 2/10 dizziness with functional activities for improved symptoms    Status  On-going    Target Date  10/17/19      PT LONG TERM GOAL #4   Title  report back pain < 3/10 with activity for improved function    Status  New    Target Date  10/17/19      PT LONG TERM GOAL #5   Title  report 50% improvement in LBP and spasms for improved function    Status  New    Target Date  10/17/19            Plan - 09/08/19 0933    Clinical Impression Statement  Good response to DN last session.  Palpable tightness in Lt lower thoracic/ upper lumbar paraspinals; slightly improved with STM - may benefit from DN to this area next visit.  Pt tolerated all exercises well, without increase in symptoms.    Personal Factors and Comorbidities  Past/Current Experience    Examination-Activity Limitations  Stairs;Locomotion Level;Carry;Caring for [employer]    Examination-Participation Restrictions  Other   work   Stability/Clinical Decision Making  Evolving/Moderate complexity    Rehab Potential  Good    PT Frequency  1x / week    PT Duration  6 weeks    PT Treatment/Interventions  ADLs/Self  Care Home  Management;Canalith Repostioning;Balance training;Therapeutic exercise;Therapeutic activities;Vestibular;Functional mobility training;Stair training;Gait training;Neuromuscular re-education;Patient/family education;Cryotherapy;Electrical Stimulation;Ultrasound;Moist Heat;Traction;Manual techniques;Dry needling;Taping    PT Next Visit Plan  continue hip and lumbar strengthening. modalites/manual/DN PRN    PT Home Exercise Plan  Access Code: [email or web address]    Consulted and Agree with Plan of Care  Patient       Patient will benefit from skilled therapeutic intervention in order to improve the following deficits and impairments:  Decreased balance, Dizziness, Decreased strength, Postural dysfunction, Hypomobility, Impaired flexibility, Pain, Increased muscle spasms, Increased fascial restricitons  Visit Diagnosis: Acute midline low back pain without sciatica  Pain in thoracic spine     Problem List Patient Active Problem List   Diagnosis Date Noted  . Elevated PTHrP level 03/31/2018  . Muscle spasm of back 12/13/2017  . Subconjunctival hematoma, left 09/14/2017  . Benign paroxysmal positional vertigo 05/11/2017  . Hyperlipidemia 03/15/2017  . Lumbar degenerative disc disease 05/25/2016  . Varicose vein of leg 06/04/2015  . Patellofemoral pain syndrome 06/04/2015  . Overactive bladder 09/24/2014  . Myofascial pain syndrome 08/27/2014  . Family history of breast cancer 09/12/2013  . Nontoxic multinodular goiter 10/19/2008  . Goiter, nontoxic, multinodular 10/19/2008   Kerin Perna, PTA 09/08/19 4:25 PM  [facility] [place] [address] [place], [place], [address] Phone: [phone]   Fax:  [phone]  Name: Ericah Scotto MRN: [id number] Date of Birth: Oct 11, 1981

## 2019-09-12 ENCOUNTER — Encounter: Payer: Federal, State, Local not specified - PPO | Admitting: Physical Therapy

## 2019-09-15 ENCOUNTER — Encounter: Payer: Federal, State, Local not specified - PPO | Admitting: Physical Therapy

## 2019-09-19 ENCOUNTER — Encounter: Payer: Self-pay | Admitting: Sports Medicine

## 2019-09-19 ENCOUNTER — Encounter: Payer: Federal, State, Local not specified - PPO | Admitting: Physical Therapy

## 2019-09-19 ENCOUNTER — Telehealth (INDEPENDENT_AMBULATORY_CARE_PROVIDER_SITE_OTHER): Payer: Federal, State, Local not specified - PPO | Admitting: Sports Medicine

## 2019-09-19 DIAGNOSIS — B349 Viral infection, unspecified: Secondary | ICD-10-CM | POA: Diagnosis not present

## 2019-09-19 DIAGNOSIS — Z20828 Contact with and (suspected) exposure to other viral communicable diseases: Secondary | ICD-10-CM | POA: Diagnosis not present

## 2019-09-19 DIAGNOSIS — J029 Acute pharyngitis, unspecified: Secondary | ICD-10-CM | POA: Diagnosis not present

## 2019-09-19 DIAGNOSIS — R05 Cough: Secondary | ICD-10-CM | POA: Diagnosis not present

## 2019-09-19 MED ORDER — AMOXICILLIN-POT CLAVULANATE 875-125 MG PO TABS: 1.0000 | ORAL_TABLET | Freq: Two times a day (BID) | ORAL | 0 refills | Status: AC

## 2019-09-19 MED ORDER — DEXAMETHASONE 4 MG PO TABS: 4.0000 mg | ORAL_TABLET | Freq: Three times a day (TID) | ORAL | 0 refills | Status: DC

## 2019-09-19 NOTE — Telephone Encounter (Signed)
Patient scheduled.

## 2019-09-19 NOTE — Progress Notes (Signed)
Virtual Visit via WebEx/MyChart   I connected with  Dawn [name]  on 09/19/19 via WebEx/MyChart/Doximity Video and verified that I am speaking with the correct person using two identifiers.   I discussed the limitations, risks, security and privacy concerns of performing an evaluation and management service by WebEx/MyChart/Doximity Video, including the higher likelihood of inaccurate diagnosis and treatment, and the availability of in person appointments.  We also discussed the likely need of an additional face to face encounter for complete and high quality delivery of care.  I also discussed with the patient that there may be a patient responsible charge related to this service. The patient expressed understanding and wishes to proceed.  Provider location is either at home or medical facility. Patient location is at their home, different from provider location. People involved in care of the patient during this telehealth encounter were myself, my nurse/medical assistant, and my front office/scheduling team member.  Subjective:    CC: Feeling sick  HPI: This is a pleasant 38 year old female, she is currently breast-feeding, for the past several days she has had increasing pain and pressure as well as nasal discharge, pressure behind her maxillary sinus with radiation to the teeth, mild cough, mild to moderate fatigue, low-grade fevers.  Minimal myalgias.  Husband and daughter are having similar symptoms.  I reviewed the past medical history, family history, social history, surgical history, and allergies today and no changes were needed.  Please see the problem list section below in epic for further details.  Past Medical History: Past Medical History:  Diagnosis Date  . AMA (advanced maternal age) multigravida 35+ 08/12/2018  . Anemia    with pregnancy  . Benign paroxysmal positional vertigo 05/11/2017  . Depression    does not currently take meds  . Goiter, nontoxic,  multinodular 10/19/2008   Overview:  Nontoxic Multinodular Goiter Nml labs, [place]  . Herniated cervical disc 2016  . Hip pain   . Hyperlipidemia 03/15/2017  . Lumbar degenerative disc disease 05/25/2016  . Muscle spasm of back 12/13/2017  . Myofascial pain syndrome 08/27/2014   negative cervical spine MRI, a negative brain MRI, nerve conduction and electromyography, and normal extensive blood work for peripheral neuropathy and myopathy.   . Nontoxic multinodular goiter 10/19/2008   Overview:  Nontoxic Multinodular Goiter  . Overactive bladder 09/24/2014  . Patellofemoral pain syndrome 06/04/2015  . Right tennis elbow   . Subconjunctival hematoma, left 09/14/2017  . Supervision of other high risk pregnancy, antepartum 08/12/2018    Nursing Staff Provider Office Location  [facility] Dating  10/LMP Language  English Anatomy US   normal Flu Vaccine  08/26/18 Genetic Screen  NIPS:Negative  AFP:  Normal  First Screen:  Quad:   TDaP vaccine   12/16/18 Hgb A1C or  GTT Early  Third trimester  Rhogam  NA   LAB RESULTS  Feeding Plan  Breast Blood Type AB/RH(D) POSITIVE/-- (04/18 4401)  Contraception  NFP Antibody NO ANTIBODIES DETECTED (04/  . Thyroid goiter    benign  . Varicose vein of leg 06/04/2015   Past Surgical History: Past Surgical History:  Procedure Laterality Date  . TONSILLECTOMY  1986   Social History: Social History   Socioeconomic History  . Marital status: Married    Spouse name: Cristie Hem  . Number of children: 1  . Years of education: 12+  . Highest education level: Not on file  Occupational History  . 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 Financial resource strain: Not hard at all  . Food insecurity    Worry: Never true    Inability: Never true  . Transportation needs    Medical: No    Non-medical: No  Tobacco Use  . Smoking status: Never Smoker  . Smokeless tobacco: Never Used  Substance and Sexual Activity  . Alcohol use: Not Currently     Alcohol/week: 0.0 standard drinks  . Drug use: No  . Sexual activity: Yes    Birth control/protection: None    Comment: Mirena - September 2012  Lifestyle  . Physical activity    Days per week: Not on file    Minutes per session: Not on file  . Stress: Not on file  Relationships  . Social Musician on phone: Not on file    Gets together: Not on file    Attends religious service: Not on file    Active member of club or organization: Not on file    Attends meetings of clubs or organizations: Not on file    Relationship status: Not on file  Other Topics Concern  . Not on file  Social History Narrative   Patient lives at home with husband and daughter.    Patient is right handed   Patient has one child   Patient has a college degree.    Caffeine: Coffee 3-4 cups/day    Family History: Family History  Problem Relation Age of Onset  . Hyperlipidemia Father   . Birth defects Maternal Grandmother   . Cancer Maternal Grandmother        Ovarian  . Birth defects Maternal Grandfather   . Cancer Maternal Grandfather   . Breast cancer Maternal Aunt 42  . Breast cancer Maternal Aunt 50  . Hypertension Sister   . Breast cancer Sister    Allergies: Allergies  Allergen Reactions  . Latex Other (See Comments)    Reaction: slight irritation   Medications: See med rec.  Review of Systems: No fevers, chills, night sweats, weight loss, chest pain, or shortness of breath.   Objective:    General: Speaking full sentences, no audible heavy breathing.  Sounds alert and appropriately interactive.  Appears well.  Face symmetric.  Extraocular movements intact.  Pupils equal and round.  No nasal flaring or accessory muscle use visualized.  No other physical exam performed due to the non-physical nature of this visit.  Impression and Recommendations:    Viral syndrome With a secondary maxillary sinusitis. Augmentin, Decadron, she will and get a COVID swab with her husband.  She understands the need to self quarantine until test results are back.   I discussed the above assessment and treatment plan with the patient. The patient was provided an opportunity to ask questions and all were answered. The patient agreed with the plan and demonstrated an understanding of the instructions.   The patient was advised to call back or seek an in-person evaluation if the symptoms worsen or if the condition fails to improve as anticipated.   I provided 25 minutes of non-face-to-face time during this encounter, 15 minutes of additional time was needed to gather information, review chart, records, communicate/coordinate with staff remotely, troubleshooting the multiple errors that we get every time when trying to do video calls through the electronic medical record, WebEx, and Doximity, restart the encounter multiple times due to instability of the software, as well as complete documentation.   ___________________________________________ Ihor Austin. Benjamin Stain, M.D., ABFM.,  CAQSM. Primary Care and Sports Medicine [facility]  Adjunct Professor of [employer]

## 2019-09-19 NOTE — Assessment & Plan Note (Addendum)
With a secondary maxillary sinusitis. Augmentin, Decadron, she will and get a COVID swab with her husband. She understands the need to self quarantine until test results are back.

## 2019-09-22 ENCOUNTER — Other Ambulatory Visit: Payer: Self-pay

## 2019-09-22 ENCOUNTER — Ambulatory Visit: Payer: Federal, State, Local not specified - PPO | Admitting: Physical Therapy

## 2019-09-22 DIAGNOSIS — M545 Low back pain, unspecified: Secondary | ICD-10-CM

## 2019-09-22 DIAGNOSIS — M546 Pain in thoracic spine: Secondary | ICD-10-CM | POA: Diagnosis not present

## 2019-09-22 NOTE — Therapy (Signed)
[place], [place], [address] Phone: [phone]   Fax:  [phone]  Physical Therapy Treatment  Patient Details  Name: Dawn [name] MRN: [id number] Date of Birth: 1981-10-17 Referring Provider (PT): Silverio Decamp, MD   Encounter Date: 09/22/2019  PT End of Session - 09/22/19 1522    Visit Number  6    Number of Visits  16    Date for PT Re-Evaluation  10/17/19    PT Start Time  0092    PT Stop Time  1224    PT Time Calculation (min)  39 min    Activity Tolerance  Patient tolerated treatment well    Behavior During Therapy  [facility] for tasks assessed/performed       Past Medical History:  Diagnosis Date  . AMA (advanced maternal age) multigravida 35+ 08/12/2018  . Anemia    with pregnancy  . Benign paroxysmal positional vertigo 05/11/2017  . Depression    does not currently take meds  . Goiter, nontoxic, multinodular 10/19/2008   Overview:  Nontoxic Multinodular Goiter Nml labs, [place]  . Herniated cervical disc 2016  . Hip pain   . Hyperlipidemia 03/15/2017  . Lumbar degenerative disc disease 05/25/2016  . Muscle spasm of back 12/13/2017  . Myofascial pain syndrome 08/27/2014   negative cervical spine MRI, a negative brain MRI, nerve conduction and electromyography, and normal extensive blood work for peripheral neuropathy and myopathy.   . Nontoxic multinodular goiter 10/19/2008   Overview:  Nontoxic Multinodular Goiter  . Overactive bladder 09/24/2014  . Patellofemoral pain syndrome 06/04/2015  . Right tennis elbow   . Subconjunctival hematoma, left 09/14/2017  . Supervision of other high risk pregnancy, antepartum 08/12/2018    Nursing Staff Provider Office Location  [facility] Dating  10/LMP Language  English Anatomy US   normal Flu Vaccine  08/26/18 Genetic Screen  NIPS:Negative  AFP:  Normal  First Screen:  Quad:   TDaP vaccine   12/16/18 Hgb A1C or  GTT Early  Third trimester  Rhogam   NA   LAB RESULTS  Feeding Plan  Breast Blood Type AB/RH(D) POSITIVE/-- (04/18 3300)  Contraception  NFP Antibody NO ANTIBODIES DETECTED (04/  . Thyroid goiter    benign  . Varicose vein of leg 06/04/2015    Past Surgical History:  Procedure Laterality Date  . TONSILLECTOMY  1986    There were no vitals filed for this visit.  Subjective Assessment - 09/22/19 1154    Subjective  Pt reports she noticed at beginning of week she had some increased pain (1-2/10) when her husband slept in bed.  Overall her lower back has been feeling good.    Patient Stated Goals  back pain    Currently in Pain?  No/denies    Pain Score  0-No pain         [facility] PT Assessment - 09/22/19 0001      Assessment   Medical Diagnosis  vertigo/ LBP    Referring Provider (PT)  Silverio Decamp, MD    Onset Date/Surgical Date  08/15/19   approx   Hand Dominance  Right    Next MD Visit  PRN    Prior Therapy  at neuro clinic 2 years ago for vertigo; previously following initial back injury      [facility] Adult PT Treatment/Exercise - 09/22/19 0001      Lumbar Exercises: Stretches   Piriformis Stretch  Right;Left;2 reps;20 seconds  Lumbar Exercises: Aerobic   Nustep  L5: 6 min       Lumbar Exercises: Standing   Shoulder Extension  Both;10 reps;Theraband    Theraband Level (Shoulder Extension)  Level 2 (Red)      Lumbar Exercises: Supine   Other Supine Lumbar Exercises  pilates 100 - beginner x 40 pulses, intermediate x 40 pulses.   90/90 heel taps x 5 then bicycle x 10       Lumbar Exercises: Prone   Opposite Arm/Leg Raise  Right arm/Left leg;Left arm/Right leg;5 reps      Lumbar Exercises: Quadruped   Madcat/Old Horse  5 reps    Other Quadruped Lumbar Exercises  childs pose x 2 reps       Manual Therapy   Soft tissue mobilization  STM and TPR to bilat thoracic parspinals into QL bil                  PT Long Term Goals - 09/22/19 1200      PT LONG TERM GOAL #1   Title   independent with HEP    Status  On-going      PT LONG TERM GOAL #2   Title  report 75% improvement in symptoms    Baseline  9/22: reports minimal vertigo symptoms    Status  Achieved      PT LONG TERM GOAL #3   Title  report symptoms </= 2/10 dizziness with functional activities for improved symptoms    Status  Achieved      PT LONG TERM GOAL #4   Title  report back pain < 3/10 with activity for improved function    Status  Achieved      PT LONG TERM GOAL #5   Title  report 50% improvement in LBP and spasms for improved function    Status  Achieved            Plan - 09/22/19 1203    Clinical Impression Statement  Pt tolerated all exercises with minor tightening of LB with standing core exercise.  Pt has met most of her goals.  Pt verbalized desire to d/c after one last visit.    Personal Factors and Comorbidities  Past/Current Experience    Examination-Activity Limitations  Stairs;Locomotion Level;Carry;Caring for [employer]    Examination-Participation Restrictions  Other   work   Stability/Clinical Decision Making  Evolving/Moderate complexity    Rehab Potential  Good    PT Frequency  1x / week    PT Duration  6 weeks    PT Treatment/Interventions  ADLs/Self Care Home Management;Canalith Repostioning;Balance training;Therapeutic exercise;Therapeutic activities;Vestibular;Functional mobility training;Stair training;Gait training;Neuromuscular re-education;Patient/family education;Cryotherapy;Electrical Stimulation;Ultrasound;Moist Heat;Traction;Manual techniques;Dry needling;Taping    PT Next Visit Plan  assess readiness to d/c.    PT Home Exercise Plan  Access Code: [email or web address]    Consulted and Agree with Plan of Care  Patient       Patient will benefit from skilled therapeutic intervention in order to improve the following deficits and impairments:  Decreased balance, Dizziness, Decreased strength, Postural dysfunction, Hypomobility, Impaired flexibility, Pain, Increased  muscle spasms, Increased fascial restricitons  Visit Diagnosis: Acute midline low back pain without sciatica  Pain in thoracic spine     Problem List Patient Active Problem List   Diagnosis Date Noted  . Viral syndrome 09/19/2019  . Elevated PTHrP level 03/31/2018  . Muscle spasm of back 12/13/2017  . Subconjunctival hematoma, left 09/14/2017  . Benign paroxysmal positional vertigo 05/11/2017  . Hyperlipidemia  03/15/2017  . Lumbar degenerative disc disease 05/25/2016  . Varicose vein of leg 06/04/2015  . Patellofemoral pain syndrome 06/04/2015  . Overactive bladder 09/24/2014  . Myofascial pain syndrome 08/27/2014  . Family history of breast cancer 09/12/2013  . Nontoxic multinodular goiter 10/19/2008  . Goiter, nontoxic, multinodular 10/19/2008   Kerin Perna, PTA 09/22/19 3:48 PM  [facility] [place] [place] [place], [place], [address] Phone: [phone]   Fax:  [phone]  Name: Dawn [name] MRN: [id number] Date of Birth: [birth year]

## 2019-09-29 ENCOUNTER — Encounter: Payer: Self-pay | Admitting: Physical Therapy

## 2019-09-29 ENCOUNTER — Ambulatory Visit (INDEPENDENT_AMBULATORY_CARE_PROVIDER_SITE_OTHER): Payer: Federal, State, Local not specified - PPO | Admitting: Physical Therapy

## 2019-09-29 ENCOUNTER — Other Ambulatory Visit: Payer: Self-pay

## 2019-09-29 DIAGNOSIS — M546 Pain in thoracic spine: Secondary | ICD-10-CM

## 2019-09-29 DIAGNOSIS — M545 Low back pain, unspecified: Secondary | ICD-10-CM

## 2019-09-29 DIAGNOSIS — R2681 Unsteadiness on feet: Secondary | ICD-10-CM

## 2019-09-29 DIAGNOSIS — M5136 Other intervertebral disc degeneration, lumbar region: Secondary | ICD-10-CM

## 2019-09-29 DIAGNOSIS — R42 Dizziness and giddiness: Secondary | ICD-10-CM

## 2019-09-29 NOTE — Therapy (Signed)
[place], [place], [address] Phone: [phone]   Fax:  [phone]  Physical Therapy Treatment  Patient Details  Name: Dawn [name] MRN: [id number] Date of Birth: [birth year] Referring Provider (PT): Silverio Decamp, MD   Encounter Date: 09/29/2019  PT End of Session - 09/29/19 1153    Visit Number  7    Number of Visits  16    Date for PT Re-Evaluation  10/17/19    PT Start Time  1100    PT Stop Time  1144    PT Time Calculation (min)  44 min    Activity Tolerance  Patient tolerated treatment well    Behavior During Therapy  [facility] for tasks assessed/performed       Past Medical History:  Diagnosis Date  . AMA (advanced maternal age) multigravida 35+ 08/12/2018  . Anemia    with pregnancy  . Benign paroxysmal positional vertigo 05/11/2017  . Depression    does not currently take meds  . Goiter, nontoxic, multinodular 10/19/2008   Overview:  Nontoxic Multinodular Goiter Nml labs, [place]  . Herniated cervical disc 2016  . Hip pain   . Hyperlipidemia 03/15/2017  . Lumbar degenerative disc disease 05/25/2016  . Muscle spasm of back 12/13/2017  . Myofascial pain syndrome 08/27/2014   negative cervical spine MRI, a negative brain MRI, nerve conduction and electromyography, and normal extensive blood work for peripheral neuropathy and myopathy.   . Nontoxic multinodular goiter 10/19/2008   Overview:  Nontoxic Multinodular Goiter  . Overactive bladder 09/24/2014  . Patellofemoral pain syndrome 06/04/2015  . Right tennis elbow   . Subconjunctival hematoma, left 09/14/2017  . Supervision of other high risk pregnancy, antepartum 08/12/2018    Nursing Staff Provider Office Location  [facility] Dating  10/LMP Language  English Anatomy US   normal Flu Vaccine  08/26/18 Genetic Screen  NIPS:Negative  AFP:  Normal  First Screen:  Quad:   TDaP vaccine   12/16/18 Hgb A1C or  GTT Early  Third trimester  Rhogam   NA   LAB RESULTS  Feeding Plan  Breast Blood Type AB/RH(D) POSITIVE/-- (04/18 4270)  Contraception  NFP Antibody NO ANTIBODIES DETECTED (04/  . Thyroid goiter    benign  . Varicose vein of leg 06/04/2015    Past Surgical History:  Procedure Laterality Date  . TONSILLECTOMY  1986    There were no vitals filed for this visit.  Subjective Assessment - 09/29/19 1103    Subjective  doing well; had a massage Wednesday and low back feels tighter (deeper muscles); having some mild vertigo symptoms but thinks it's coming from headache    Patient Stated Goals  back pain    Currently in Pain?  --   did not rate; c/o soreness and tightness low back                      [facility] Adult PT Treatment/Exercise - 09/29/19 0001      Lumbar Exercises: Stretches   Other Lumbar Stretch Exercise  childs pose 2x30 sec      Lumbar Exercises: Aerobic   Nustep  L5: 6 min       Manual Therapy   Soft tissue mobilization  STM to bil suboccipitals and cervical paraspinals, lumbar paraspinals and into QL       Trigger Point Dry Needling - 09/29/19 1152    Consent Given?  Yes    Education Handout  Provided  Previously provided    Muscles Treated Head and Neck  Suboccipitals;Oblique capitus    Muscles Treated Back/Hip  Lumbar multifidi    Electrical Stimulation Performed with Dry Needling  Yes    E-stim with Dry Needling Details  lumbar multifidi to tolerance x 5 min    Oblique Capitus Response  Twitch response elicited;Palpable increased muscle length    SubOccipitals Response  Twitch response elicited;Palpable increased muscle length    Lumbar multifidi Response  Twitch response elicited;Palpable increased muscle length                PT Long Term Goals - 09/29/19 1153      PT LONG TERM GOAL #1   Title  independent with HEP    Status  On-going    Target Date  10/17/19      PT LONG TERM GOAL #2   Title  report 75% improvement in symptoms    Baseline  9/22: reports minimal  vertigo symptoms    Status  Achieved      PT LONG TERM GOAL #3   Title  report symptoms </= 2/10 dizziness with functional activities for improved symptoms    Status  Achieved      PT LONG TERM GOAL #4   Title  report back pain < 3/10 with activity for improved function    Status  Achieved      PT LONG TERM GOAL #5   Title  report 50% improvement in LBP and spasms for improved function    Status  Achieved            Plan - 09/29/19 1153    Clinical Impression Statement  Pt presented today for final visit for DN to low back as well as suboccipitals due to some mild cervicogenic dizziness symptoms, as well as lumbar spine tightness.  Pt with decreased symptoms following session, and all goals still met.  Did not formally assess HEP, and will assess if pt returns.  Plan to hold PT and pt to call if needed within 30 days.  If not, will d/c PT.    Personal Factors and Comorbidities  Past/Current Experience    Examination-Activity Limitations  Stairs;Locomotion Level;Carry;Caring for [employer]    Examination-Participation Restrictions  Other   work   Stability/Clinical Decision Making  Evolving/Moderate complexity    Rehab Potential  Good    PT Frequency  1x / week    PT Duration  6 weeks    PT Treatment/Interventions  ADLs/Self Care Home Management;Canalith Repostioning;Balance training;Therapeutic exercise;Therapeutic activities;Vestibular;Functional mobility training;Stair training;Gait training;Neuromuscular re-education;Patient/family education;Cryotherapy;Electrical Stimulation;Ultrasound;Moist Heat;Traction;Manual techniques;Dry needling;Taping    PT Next Visit Plan  hold x 30 days; d/c if pt doesn't return    PT Home Exercise Plan  Access Code: [email or web address]    Consulted and Agree with Plan of Care  Patient       Patient will benefit from skilled therapeutic intervention in order to improve the following deficits and impairments:  Decreased balance, Dizziness, Decreased  strength, Postural dysfunction, Hypomobility, Impaired flexibility, Pain, Increased muscle spasms, Increased fascial restricitons  Visit Diagnosis: Acute midline low back pain without sciatica  Pain in thoracic spine  Dizziness and giddiness  Unsteadiness on feet  Lumbar degenerative disc disease     Problem List Patient Active Problem List   Diagnosis Date Noted  . Viral syndrome 09/19/2019  . Elevated PTHrP level 03/31/2018  . Muscle spasm of back 12/13/2017  . Subconjunctival hematoma, left 09/14/2017  . Benign paroxysmal  positional vertigo 05/11/2017  . Hyperlipidemia 03/15/2017  . Lumbar degenerative disc disease 05/25/2016  . Varicose vein of leg 06/04/2015  . Patellofemoral pain syndrome 06/04/2015  . Overactive bladder 09/24/2014  . Myofascial pain syndrome 08/27/2014  . Family history of breast cancer 09/12/2013  . Nontoxic multinodular goiter 10/19/2008  . Goiter, nontoxic, multinodular 10/19/2008      Laureen Abrahams, PT, DPT 09/29/19 11:56 AM    [place] [facility] [place], [place], [address] Phone: [phone]   Fax:  [phone]  Name: Aleni Andrus MRN: [id number] Date of Birth: 03-Nov-1981

## 2019-10-16 ENCOUNTER — Other Ambulatory Visit: Payer: Self-pay

## 2019-10-16 ENCOUNTER — Other Ambulatory Visit: Payer: Self-pay | Admitting: Sports Medicine

## 2019-10-16 DIAGNOSIS — H8113 Benign paroxysmal vertigo, bilateral: Secondary | ICD-10-CM

## 2019-10-17 ENCOUNTER — Encounter: Payer: Self-pay | Admitting: Sports Medicine

## 2019-10-17 DIAGNOSIS — R42 Dizziness and giddiness: Secondary | ICD-10-CM

## 2019-10-17 DIAGNOSIS — H814 Vertigo of central origin: Secondary | ICD-10-CM

## 2019-10-17 NOTE — Assessment & Plan Note (Signed)
Suspect benign positional vertigo, at this point she has had persistent symptoms in spite of vestibular rehabilitation, scopolamine patch, prednisone. Symptoms have been present now for months in spite of conservative treatment. At this point I would like consultations with neurology and otolaryngology, I am also going to get an MRI of her brain with thin slices through the internal acoustic meatus.

## 2019-10-24 ENCOUNTER — Other Ambulatory Visit: Payer: Self-pay

## 2019-10-24 ENCOUNTER — Ambulatory Visit (INDEPENDENT_AMBULATORY_CARE_PROVIDER_SITE_OTHER): Payer: Federal, State, Local not specified - PPO

## 2019-10-24 DIAGNOSIS — R42 Dizziness and giddiness: Secondary | ICD-10-CM | POA: Diagnosis not present

## 2019-10-24 DIAGNOSIS — H814 Vertigo of central origin: Secondary | ICD-10-CM

## 2019-10-24 MED ORDER — GADOBUTROL 1 MMOL/ML IV SOLN
6.5000 mL | Freq: Once | INTRAVENOUS | Status: AC | PRN
  Administered 2019-10-24: 6.5 mL via INTRAVENOUS

## 2019-10-25 ENCOUNTER — Other Ambulatory Visit: Payer: Self-pay

## 2019-10-25 ENCOUNTER — Ambulatory Visit (INDEPENDENT_AMBULATORY_CARE_PROVIDER_SITE_OTHER): Payer: Federal, State, Local not specified - PPO | Admitting: Physical Therapy

## 2019-10-25 DIAGNOSIS — M545 Low back pain, unspecified: Secondary | ICD-10-CM

## 2019-10-25 DIAGNOSIS — R2681 Unsteadiness on feet: Secondary | ICD-10-CM

## 2019-10-25 DIAGNOSIS — M546 Pain in thoracic spine: Secondary | ICD-10-CM | POA: Diagnosis not present

## 2019-10-25 DIAGNOSIS — R42 Dizziness and giddiness: Secondary | ICD-10-CM

## 2019-10-25 NOTE — Patient Instructions (Signed)
Access Code: [email or web address]  URL: [email or web address]  Date: 10/25/2019  Prepared by: Almyra Free Melissia Lahman   Exercises Seated Eye and Head Movement Coordination - Side to Side - 10 reps - 3 sets - 1x daily - 7x weekly Walking Gaze Stabilization Head Rotation - 1 reps - 1 sets - 10-15 feet - 1x daily - 7x weekly Walking Gaze Stabilization Head Nod - 1 reps - 1 sets - 10-15 feet                            - 1x daily - 7x weekly Romberg Stance with Head Nods on Foam Pad - 1 reps - 1 sets - 10 rotations each way - 2x daily - 7x weekly Standing Gaze Stabilization with Head Rotation - 1 sets - 30 Seconds - 2x daily - 7x weekly Standing Hip Abduction - 10 reps - 3 sets - 1x daily - 7x weekly Sidelying Open Book Thoracic Lumbar Rotation and Extension - 10 reps - 1-2 sets - 1x daily - 7x weekly Supine Lower Trunk Rotation - 2-3 reps - 1 sets - 15 hold - 1x daily                            - 7x weekly Seated Cervical Retraction - 10 reps - 1 sets - 3-5 sec hold - 3x daily - 7x weekly Thoracic Extension Mobilization on Foam Roll - 5 reps - 2 sets - 30 sec hold - 2x daily - 7x weekly

## 2019-10-25 NOTE — Therapy (Signed)
[place], [place], [address] Phone: [phone]   Fax:  [phone]  Physical Therapy Treatment  Patient Details  Name: Dawn [name] MRN: [id number] Date of Birth: January 25, 1981 Referring Provider (PT): Silverio Decamp, MD   Encounter Date: 10/25/2019  PT End of Session - 10/25/19 1155    Visit Number  8    Number of Visits  16    Date for PT Re-Evaluation  12/06/19    PT Start Time  4270    PT Stop Time  1234    PT Time Calculation (min)  39 min    Activity Tolerance  Patient tolerated treatment well    Behavior During Therapy  [facility] for tasks assessed/performed       Past Medical History:  Diagnosis Date  . AMA (advanced maternal age) multigravida 35+ 08/12/2018  . Anemia    with pregnancy  . Benign paroxysmal positional vertigo 05/11/2017  . Depression    does not currently take meds  . Goiter, nontoxic, multinodular 10/19/2008   Overview:  Nontoxic Multinodular Goiter Nml labs, [place]  . Herniated cervical disc 2016  . Hip pain   . Hyperlipidemia 03/15/2017  . Lumbar degenerative disc disease 05/25/2016  . Muscle spasm of back 12/13/2017  . Myofascial pain syndrome 08/27/2014   negative cervical spine MRI, a negative brain MRI, nerve conduction and electromyography, and normal extensive blood work for peripheral neuropathy and myopathy.   . Nontoxic multinodular goiter 10/19/2008   Overview:  Nontoxic Multinodular Goiter  . Overactive bladder 09/24/2014  . Patellofemoral pain syndrome 06/04/2015  . Right tennis elbow   . Subconjunctival hematoma, left 09/14/2017  . Supervision of other high risk pregnancy, antepartum 08/12/2018    Nursing Staff Provider Office Location  [facility] Dating  10/LMP Language  English Anatomy US   normal Flu Vaccine  08/26/18 Genetic Screen  NIPS:Negative  AFP:  Normal  First Screen:  Quad:   TDaP vaccine   12/16/18 Hgb A1C or  GTT Early  Third trimester  Rhogam   NA   LAB RESULTS  Feeding Plan  Breast Blood Type AB/RH(D) POSITIVE/-- (04/18 6237)  Contraception  NFP Antibody NO ANTIBODIES DETECTED (04/  . Thyroid goiter    benign  . Varicose vein of leg 06/04/2015    Past Surgical History:  Procedure Laterality Date  . TONSILLECTOMY  1986    There were no vitals filed for this visit.  Subjective Assessment - 10/25/19 1155    Subjective  Patient has been waking with low back feeling like it's going to spasm since 10/16/19. She also is having increased dizziness since then and she is experiencing this happening even when she is sitting down.Today she denies dizziness. She took migraine meds today. She has also cut back on her decaf coffee.    Pertinent History  vertigo, BPPV, herniated cervical disc 2016, lumbar DDD    Patient Stated Goals  get rid of pain    Currently in Pain?  No/denies         [facility] PT Assessment - 10/25/19 0001      Posture/Postural Control   Postural Limitations  Rounded Shoulders;Forward head;Increased thoracic kyphosis    Posture Comments  left pecs tighter than right      AROM   Overall AROM Comments  lumbar WFL except left SB tight on right and pain in lower thoracic with lumbar ext      Strength   Right Hip  Flexion  5/5    Right Hip Extension  5/5    Right Hip ABduction  5/5    Left Hip Flexion  5/5    Left Hip Extension  5/5    Left Hip ABduction  5/5    Right Knee Flexion  4+/5    Right Knee Extension  5/5    Left Knee Flexion  5/5    Left Knee Extension  5/5      Flexibility   Soft Tissue Assessment /Muscle Length  yes    Piriformis  tight right side    Quadratus Lumborum  tight right paraspinals      Palpation   Spinal mobility  tight bil with PA mobs in lumbar left >right and more painful on left    Palpation comment  right gluteus med                   [facility] Adult PT Treatment/Exercise - 10/25/19 0001      Exercises   Exercises  Neck      Neck Exercises: Standing   Neck  Retraction  5 reps;5 secs      Neck Exercises: Supine   Other Supine Exercise  thoracic ext over bolster 2x30 sec with hands supporting neck      Lumbar Exercises: Stretches   Piriformis Stretch  Right;1 rep;20 seconds    Piriformis Stretch Limitations  also performed modification with left lumbar rotation    Other Lumbar Stretch Exercise  childs pose x 20 sec    Other Lumbar Stretch Exercise  standing hip flexor stretch, supine C stretch and lateral glide stretch all attempted but no significant stretch      Manual Therapy   Manual Therapy  Joint mobilization;Soft tissue mobilization    Manual therapy comments  skilled palpation and monitoring of soft tissue during DN     Joint Mobilization  Gd II/III to lumbar and thoracic, UPA to lumbar    Soft tissue mobilization  to right gluteals       Trigger Point Dry Needling - 10/25/19 0001    Consent Given?  Yes    Education Handout Provided  Previously provided    Muscles Treated Head and Neck  Upper trapezius;Suboccipitals;Cervical multifidi    Muscles Treated Back/Hip  Gluteus medius    Dry Needling Comments  right Glut    Upper Trapezius Response  Twitch reponse elicited;Palpable increased muscle length    SubOccipitals Response  Twitch response elicited;Palpable increased muscle length    Cervical multifidi Response  Palpable increased muscle length    Gluteus Medius Response  Twitch response elicited;Palpable increased muscle length           PT Education - 10/25/19 1310    Education Details  progressed HEP    Person(s) Educated  Patient    Methods  Explanation;Demonstration;Handout    Comprehension  Verbalized understanding;Returned demonstration          PT Long Term Goals - 10/25/19 1158      PT LONG TERM GOAL #1   Title  independent with HEP    Baseline  patient reports non compliance except stretches    Time  6    Period  Weeks    Status  On-going    Target Date  12/06/19      PT LONG TERM GOAL #2    Title  report 75% improvement in symptoms    Baseline  patient has regressed starting 10/16/19    Status  On-going      PT LONG TERM GOAL #3   Title  report symptoms </= 2/10 dizziness with functional activities for improved symptoms    Status  On-going      PT LONG TERM GOAL #4   Title  report back pain < 3/10 with activity for improved function    Baseline  4/10 in the past week in the morning    Status  On-going      PT LONG TERM GOAL #5   Title  report 50% improvement in LBP and spasms for improved function    Baseline  no spasm reported but feel like they're about to    Status  On-going            Plan - 10/25/19 1311    Clinical Impression Statement  Patient returns to PT after 3 week absence. She has had increased back pain and dizziness x 1 week. She denies pain today but has pain with PA mobs and visible tightness in right lumbar paraspinals. She continues to have TPs in subocciptals and UTs and responded well to DM here as well as in right gluteus medius. She has instability in her core and postural deficits both of which will benefit from strengthening. Vertigo to be treated prn.    Stability/Clinical Decision Making  Evolving/Moderate complexity    Clinical Decision Making  Low    Rehab Potential  Good    PT Frequency  2x / week    PT Duration  6 weeks    PT Treatment/Interventions  ADLs/Self Care Home Management;Canalith Repostioning;Balance training;Therapeutic exercise;Therapeutic activities;Vestibular;Functional mobility training;Stair training;Gait training;Neuromuscular re-education;Patient/family education;Cryotherapy;Electrical Stimulation;Ultrasound;Moist Heat;Traction;Manual techniques;Dry needling;Taping    PT Next Visit Plan  cervical and lumbar stabilization, chest opening and thoracic extension, DN and vertigo Rx prn.    PT Home Exercise Plan  Access Code: [email or web address]       Patient will benefit from skilled therapeutic intervention in order to improve the  following deficits and impairments:  Decreased balance, Dizziness, Decreased strength, Postural dysfunction, Hypomobility, Impaired flexibility, Pain, Increased muscle spasms, Increased fascial restricitons  Visit Diagnosis: Acute midline low back pain without sciatica - Plan: PT plan of care cert/re-cert  Dizziness and giddiness - Plan: PT plan of care cert/re-cert  Pain in thoracic spine - Plan: PT plan of care cert/re-cert  Unsteadiness on feet - Plan: PT plan of care cert/re-cert     Problem List Patient Active Problem List   Diagnosis Date Noted  . Viral syndrome 09/19/2019  . Elevated PTHrP level 03/31/2018  . Muscle spasm of back 12/13/2017  . Subconjunctival hematoma, left 09/14/2017  . Vertigo 05/11/2017  . Hyperlipidemia 03/15/2017  . Lumbar degenerative disc disease 05/25/2016  . Varicose vein of leg 06/04/2015  . Patellofemoral pain syndrome 06/04/2015  . Overactive bladder 09/24/2014  . Myofascial pain syndrome 08/27/2014  . Family history of breast cancer 09/12/2013  . Nontoxic multinodular goiter 10/19/2008  . Goiter, nontoxic, multinodular 10/19/2008    Solon Palm PT 10/25/2019, 1:26 PM  [facility] [address] [place], [place], [address] Phone: [phone]   Fax:  [phone]  Name: Dawn [name] MRN: [id number] Date of Birth: 12/30/1980

## 2019-10-31 ENCOUNTER — Other Ambulatory Visit: Payer: Self-pay

## 2019-10-31 ENCOUNTER — Encounter: Payer: Self-pay | Admitting: Physical Therapy

## 2019-10-31 ENCOUNTER — Ambulatory Visit (INDEPENDENT_AMBULATORY_CARE_PROVIDER_SITE_OTHER): Payer: Federal, State, Local not specified - PPO | Admitting: Physical Therapy

## 2019-10-31 DIAGNOSIS — M545 Low back pain, unspecified: Secondary | ICD-10-CM

## 2019-10-31 NOTE — Therapy (Signed)
[place], [place], [address] Phone: [phone]   Fax:  [phone]  Physical Therapy Treatment  Patient Details  Name: Dawn [name] MRN: [id number] Date of Birth: 1981-11-11 Referring Provider (PT): Silverio Decamp, MD   Encounter Date: 10/31/2019  PT End of Session - 10/31/19 1151    Visit Number  9    Number of Visits  16    Date for PT Re-Evaluation  12/06/19    PT Start Time  3299    PT Stop Time  1238    PT Time Calculation (min)  47 min    Activity Tolerance  Patient tolerated treatment well    Behavior During Therapy  [facility] for tasks assessed/performed       Past Medical History:  Diagnosis Date  . AMA (advanced maternal age) multigravida 35+ 08/12/2018  . Anemia    with pregnancy  . Benign paroxysmal positional vertigo 05/11/2017  . Depression    does not currently take meds  . Goiter, nontoxic, multinodular 10/19/2008   Overview:  Nontoxic Multinodular Goiter Nml labs, [place]  . Herniated cervical disc 2016  . Hip pain   . Hyperlipidemia 03/15/2017  . Lumbar degenerative disc disease 05/25/2016  . Muscle spasm of back 12/13/2017  . Myofascial pain syndrome 08/27/2014   negative cervical spine MRI, a negative brain MRI, nerve conduction and electromyography, and normal extensive blood work for peripheral neuropathy and myopathy.   . Nontoxic multinodular goiter 10/19/2008   Overview:  Nontoxic Multinodular Goiter  . Overactive bladder 09/24/2014  . Patellofemoral pain syndrome 06/04/2015  . Right tennis elbow   . Subconjunctival hematoma, left 09/14/2017  . Supervision of other high risk pregnancy, antepartum 08/12/2018    Nursing Staff Provider Office Location  [facility] Dating  10/LMP Language  English Anatomy US   normal Flu Vaccine  08/26/18 Genetic Screen  NIPS:Negative  AFP:  Normal  First Screen:  Quad:   TDaP vaccine   12/16/18 Hgb A1C or  GTT Early  Third trimester  Rhogam   NA   LAB RESULTS  Feeding Plan  Breast Blood Type AB/RH(D) POSITIVE/-- (04/18 2426)  Contraception  NFP Antibody NO ANTIBODIES DETECTED (04/  . Thyroid goiter    benign  . Varicose vein of leg 06/04/2015    Past Surgical History:  Procedure Laterality Date  . TONSILLECTOMY  1986    There were no vitals filed for this visit.  Subjective Assessment - 10/31/19 1152    Subjective  Patient reports increased pain in morning but now light pain today after doing stretches.    Patient Stated Goals  get rid of pain    Currently in Pain?  Yes    Pain Score  1     Pain Location  Back    Pain Orientation  Mid;Lower    Pain Descriptors / Indicators  Aching                       [facility] Adult PT Treatment/Exercise - 10/31/19 0001      Neck Exercises: Theraband   Shoulder Extension  10 reps;Green    Rows  10 reps;Green      Neck Exercises: Standing   Other Standing Exercises         Lumbar Exercises: Standing   Forward Lunge  20 reps    Forward Lunge Limitations  partial lunge with rotation x 10 ea side    Other  Standing Lumbar Exercises  dead lift with 5# wt x 5 each side with cues for proper form      Lumbar Exercises: Supine   Bridge  5 reps    Bridge Limitations  feet on ball 10 sec hold    Bridge with March  10 reps    Other Supine Lumbar Exercises  bridge with alternating knee ext x 10      Lumbar Exercises: Quadruped   Straight Leg Raise  20 reps    Straight Leg Raises Limitations  then toe tap extension x 10 bil; weakness in right lumbar    Plank  2 x 10 sec      Manual Therapy   Manual Therapy  Soft tissue mobilization    Manual therapy comments  skilled palpation and monitoring of soft tissue during DN     Soft tissue mobilization  to right lumbar and QL      Neck Exercises: Stretches   Chest Stretch  2 reps;30 seconds       Trigger Point Dry Needling - 10/31/19 0001    Consent Given?  Yes    Education Handout Provided  Previously provided     Muscles Treated Back/Hip  Quadratus lumborum;Lumbar multifidi    Lumbar multifidi Response  Twitch response elicited;Palpable increased muscle length    Quadratus Lumborum Response  Twitch response elicited;Palpable increased muscle length   right          PT Education - 10/31/19 1243    Education Details  progressed HEP    Person(s) Educated  Patient    Methods  Explanation;Demonstration;Handout    Comprehension  Verbalized understanding;Returned demonstration          PT Long Term Goals - 10/25/19 1158      PT LONG TERM GOAL #1   Title  independent with HEP    Baseline  patient reports non compliance except stretches    Time  6    Period  Weeks    Status  On-going    Target Date  12/06/19      PT LONG TERM GOAL #2   Title  report 75% improvement in symptoms    Baseline  patient has regressed starting 10/16/19    Status  On-going      PT LONG TERM GOAL #3   Title  report symptoms </= 2/10 dizziness with functional activities for improved symptoms    Status  On-going      PT LONG TERM GOAL #4   Title  report back pain < 3/10 with activity for improved function    Baseline  4/10 in the past week in the morning    Status  On-going      PT LONG TERM GOAL #5   Title  report 50% improvement in LBP and spasms for improved function    Baseline  no spasm reported but feel like they're about to    Status  On-going            Plan - 10/31/19 1239    Clinical Impression Statement  Patient demos weakness in right lumbar stabilizers as well as abdominal muscles with TE. She fatigues easily, but able to correct most deviations with VCs. Good response to DN in lumbar spine today. May need more STW here next visit.    PT Treatment/Interventions  ADLs/Self Care Home Management;Canalith Repostioning;Balance training;Therapeutic exercise;Therapeutic activities;Vestibular;Functional mobility training;Stair training;Gait training;Neuromuscular re-education;Patient/family  education;Cryotherapy;Electrical Stimulation;Ultrasound;Moist Heat;Traction;Manual techniques;Dry needling;Taping    PT Next Visit Plan  cervical and lumbar stabilization, chest opening and thoracic extension, DN and vertigo Rx prn.    PT Home Exercise Plan  Access Code: [email or web address]; [email or web address] (issued 10/31/19)       Patient will benefit from skilled therapeutic intervention in order to improve the following deficits and impairments:  Decreased balance, Dizziness, Decreased strength, Postural dysfunction, Hypomobility, Impaired flexibility, Pain, Increased muscle spasms, Increased fascial restricitons  Visit Diagnosis: Acute midline low back pain without sciatica     Problem List Patient Active Problem List   Diagnosis Date Noted  . Viral syndrome 09/19/2019  . Elevated PTHrP level 03/31/2018  . Muscle spasm of back 12/13/2017  . Subconjunctival hematoma, left 09/14/2017  . Vertigo 05/11/2017  . Hyperlipidemia 03/15/2017  . Lumbar degenerative disc disease 05/25/2016  . Varicose vein of leg 06/04/2015  . Patellofemoral pain syndrome 06/04/2015  . Overactive bladder 09/24/2014  . Myofascial pain syndrome 08/27/2014  . Family history of breast cancer 09/12/2013  . Nontoxic multinodular goiter 10/19/2008  . Goiter, nontoxic, multinodular 10/19/2008    Solon Palm PT 10/31/2019, 12:49 PM  [facility] [address] [place], [place], [address] Phone: [phone]   Fax:  [phone]  Name: Dawn [name] MRN: [id number] Date of Birth: 1981/05/18

## 2019-10-31 NOTE — Patient Instructions (Signed)
Access Code: [email or web address]  URL: [email or web address]  Date: 10/31/2019  Prepared by: Madelyn Flavors   Exercises Marching Bridge - 5 reps - 3 sets - 1x daily - 7x weekly Alternating Single Leg Bridge - 5 reps - 3 sets - 1x daily - 7x weekly Standard Plank - 5 reps - 1 sets - max hold - 2x daily - 7x weekly Quadruped Leg Lifts - 10 reps - 3 sets - 1x daily - 7x weekly

## 2019-11-02 ENCOUNTER — Ambulatory Visit: Payer: Federal, State, Local not specified - PPO | Admitting: Obstetrics & Gynecology

## 2019-11-03 ENCOUNTER — Other Ambulatory Visit: Payer: Self-pay

## 2019-11-03 ENCOUNTER — Ambulatory Visit (INDEPENDENT_AMBULATORY_CARE_PROVIDER_SITE_OTHER): Payer: Federal, State, Local not specified - PPO | Admitting: Rehabilitative and Restorative Service Providers"

## 2019-11-03 ENCOUNTER — Encounter: Payer: Self-pay | Admitting: Rehabilitative and Restorative Service Providers"

## 2019-11-03 DIAGNOSIS — R42 Dizziness and giddiness: Secondary | ICD-10-CM | POA: Diagnosis not present

## 2019-11-03 DIAGNOSIS — R2681 Unsteadiness on feet: Secondary | ICD-10-CM

## 2019-11-03 NOTE — Patient Instructions (Signed)
Access Code: [email or web address]  URL: [email or web address]  Date: 11/03/2019  Prepared by: Rudell Cobb   Exercises Seated Eye and Head Movement Coordination - Side to Side - 10 reps - 1 sets - 2x daily - 7x weekly Romberg Stance with Head Nods on Foam Pad - 1 reps - 1 sets - 10 rotations each way - 2x daily - 7x weekly Standing Gaze Stabilization with Head Rotation - 2-3 sets - 45 Seconds - 3x daily - 7x weekly Standing Hip Abduction - 10 reps - 3 sets - 1x daily - 7x weekly Sidelying Open Book Thoracic Lumbar Rotation and Extension - 10 reps - 1-2 sets - 1x daily - 7x weekly Supine Lower Trunk Rotation - 2-3 reps - 1 sets - 15 hold - 1x daily                            - 7x weekly Marching Bridge - 5 reps - 3 sets - 1x daily - 7x weekly Seated Cervical Retraction - 10 reps - 1 sets - 3-5 sec hold - 3x daily - 7x weekly Alternating Single Leg Bridge - 5 reps - 3 sets - 1x daily - 7x weekly Thoracic Extension Mobilization on Foam Roll - 5 reps - 2 sets - 30 sec hold - 2x daily - 7x weekly Standard Plank - 5 reps - 1 sets - 1x daily - 7x weekly Quadruped Leg Lifts - 10 reps - 3 sets - 1x daily - 7x weekly

## 2019-11-03 NOTE — Therapy (Signed)
[place]  [place], [place], [address] Phone: [phone]   Fax:  [phone]  Physical Therapy Treatment  Patient Details  Name: Dawn [name] MRN: [id number] Date of Birth: 05-Mar-1981 Referring Provider (PT): Silverio Decamp, MD   Encounter Date: 11/03/2019  PT End of Session - 11/03/19 1154    Visit Number  10    Number of Visits  16    Date for PT Re-Evaluation  12/06/19    PT Start Time  1100    PT Stop Time  1145    PT Time Calculation (min)  45 min    Activity Tolerance  Patient tolerated treatment well    Behavior During Therapy  [facility] for tasks assessed/performed       Past Medical History:  Diagnosis Date  . AMA (advanced maternal age) multigravida 35+ 08/12/2018  . Anemia    with pregnancy  . Benign paroxysmal positional vertigo 05/11/2017  . Depression    does not currently take meds  . Goiter, nontoxic, multinodular 10/19/2008   Overview:  Nontoxic Multinodular Goiter Nml labs, [place]  . Herniated cervical disc 2016  . Hip pain   . Hyperlipidemia 03/15/2017  . Lumbar degenerative disc disease 05/25/2016  . Muscle spasm of back 12/13/2017  . Myofascial pain syndrome 08/27/2014   negative cervical spine MRI, a negative brain MRI, nerve conduction and electromyography, and normal extensive blood work for peripheral neuropathy and myopathy.   . Nontoxic multinodular goiter 10/19/2008   Overview:  Nontoxic Multinodular Goiter  . Overactive bladder 09/24/2014  . Patellofemoral pain syndrome 06/04/2015  . Right tennis elbow   . Subconjunctival hematoma, left 09/14/2017  . Supervision of other high risk pregnancy, antepartum 08/12/2018    Nursing Staff Provider Office Location  [facility] Dating  10/LMP Language  English Anatomy US   normal Flu Vaccine  08/26/18 Genetic Screen  NIPS:Negative  AFP:  Normal  First Screen:  Quad:   TDaP vaccine   12/16/18 Hgb A1C or  GTT Early  Third trimester  Rhogam   NA   LAB RESULTS  Feeding Plan  Breast Blood Type AB/RH(D) POSITIVE/-- (04/18 2703)  Contraception  NFP Antibody NO ANTIBODIES DETECTED (04/  . Thyroid goiter    benign  . Varicose vein of leg 06/04/2015    Past Surgical History:  Procedure Laterality Date  . TONSILLECTOMY  1986    There were no vitals filed for this visit.  Subjective Assessment - 11/03/19 1102    Subjective  The patient reports that driving seems to trigger vertigo.  She describes a sensation that she just got off of the roller coaster.  The sensation is present when sitting down.  She missed work last Friday due to vertigo and has a headache today with symptoms.  She does get light sensitivity and is unable to drive at night.  She feels that during episodes she has to turn up the radio to hear the same level of volume, she denies tinnitus, she does get blurry vision, she denies double vision, she denies auditory fullness, she does get some L ear pain.  She felt tightness in her jaw and neck with prior vertigo episodes.  The patient reports episodes last for a week at a time and she has slept mostof the day to reduce symptoms.    Pertinent History  vertigo, BPPV, herniated cervical disc 2016, lumbar DDD    Patient Stated Goals  get rid of pain  Currently in Pain?  No/denies    Pain Score  --   meds reduced her headache        [facility] PT Assessment - 11/03/19 1125      High Level Balance   High Level Balance Comments  Standing on foam eyes open + eyes closed x 30 seconds with mild sway when eyes closed.         Vestibular Assessment - 11/03/19 1112      Vestibular Assessment   General Observation  walks into clinic without imbalance observed, dizziness 3/10 baseline      Symptom Behavior   Subjective history of current problem  intermittent episodes of vertigo    Type of Dizziness   Imbalance;"World moves"   denies true spinning   Frequency of Dizziness  comes and goes; "kicked back up yesterday"     Duration of Dizziness  constant when aggravated with exacerbating movements    Symptom Nature  Constant;Motion provoked    Aggravating Factors  Driving;Looking up to the ceiling;Turning head quickly    Relieving Factors  Medication;Rest    Progression of Symptoms  Worse    History of similar episodes  h/o Intermittent symptoms x 2 years (03/2017)      Oculomotor Exam   Oculomotor Alignment  Normal    Ocular ROM  WFLs    Spontaneous  Absent    Gaze-induced   Absent    Smooth Pursuits  Intact    Saccades  Intact      Vestibulo-Ocular Reflex   VOR 2 Head and Object (x 2 viewing)  more provoking, increasing symtpoms to 4/10    VOR Cancellation  Normal   mild sense of sway, no corrective saccades noted   Comment  Head impulse= + for refixation saccade to the right side.      Positional Testing   Dix-Hallpike  Dix-Hallpike Right;Dix-Hallpike Left    Sidelying Test  Sidelying Right;Sidelying Left    Horizontal Canal Testing  Horizontal Canal Right;Horizontal Canal Left      Dix-Hallpike Right   Dix-Hallpike Right Duration  mild sensation of dizziness (not spinning, more of a fullness)    Dix-Hallpike Right Symptoms  No nystagmus      Dix-Hallpike Left   Dix-Hallpike Left Duration  no    Dix-Hallpike Left Symptoms  No nystagmus      Sidelying Right   Sidelying Right Duration  no    Sidelying Right Symptoms  No nystagmus      Sidelying Left   Sidelying Left Duration  no    Sidelying Left Symptoms  No nystagmus      Horizontal Canal Right   Horizontal Canal Right Duration  no    Horizontal Canal Right Symptoms  Normal               [facility] Adult PT Treatment/Exercise - 11/03/19 1152      Self-Care   Self-Care  Other Self-Care Comments    Other Self-Care Comments   The patient inquired about HEP performance when symptoms worse.  PT recommended she perform if symptoms less than 4/10 and try walking when symptoms greater to continue to get her system to work towards  adaptation, compensation      Vestibular Treatment/Exercise - 11/03/19 1151      Vestibular Treatment/Exercise   Vestibular Treatment Provided  Habituation;Gaze    Habituation Exercises  Standing Horizontal Head Turns;Standing Vertical Head Turns    Gaze Exercises  X1 Viewing Horizontal;Eye/Head Exercise Horizontal;X2 Viewing Horizontal  Standing Horizontal Head Turns   Number of Reps   5    Symptom Description   on foam with feet apart; mild increase in symptoms      Standing Vertical Head Turns   Number of Reps   5    Symptom Description   on foam with feet apart; mild increase in symptoms      X1 Viewing Horizontal   Foot Position  standing feet apart    Comments  x 45 seconds, cues to avoid lateral head tilt versus rotation.  Also recomended increased frequency during the day      X2 Viewing Horizontal   Foot Position  standing feet apart     Comments  increases symptoms      Eye/Head Exercise Horizontal   Foot Position  seated    Comments  eye/head coordination working on compensatory saccades                 PT Long Term Goals - 10/25/19 1158      PT LONG TERM GOAL #1   Title  independent with HEP    Baseline  patient reports non compliance except stretches    Time  6    Period  Weeks    Status  On-going    Target Date  12/06/19      PT LONG TERM GOAL #2   Title  report 75% improvement in symptoms    Baseline  patient has regressed starting 10/16/19    Status  On-going      PT LONG TERM GOAL #3   Title  report symptoms </= 2/10 dizziness with functional activities for improved symptoms    Status  On-going      PT LONG TERM GOAL #4   Title  report back pain < 3/10 with activity for improved function    Baseline  4/10 in the past week in the morning    Status  On-going      PT LONG TERM GOAL #5   Title  report 50% improvement in LBP and spasms for improved function    Baseline  no spasm reported but feel like they're about to    Status   On-going            Plan - 11/03/19 1140    Clinical Impression Statement  The patient expresses frustration today at return of symptoms.  PT reassessed due to recent worsening of symtpoms, progressed exercises to patient tolerance and reoved walking with gaze until she can tolerate greater duration of static gaze adaptation exercises.  Plan to continue working to LTGs.    PT Treatment/Interventions  ADLs/Self Care Home Management;Canalith Repostioning;Balance training;Therapeutic exercise;Therapeutic activities;Vestibular;Functional mobility training;Stair training;Gait training;Neuromuscular re-education;Patient/family education;Cryotherapy;Electrical Stimulation;Ultrasound;Moist Heat;Traction;Manual techniques;Dry needling;Taping    PT Next Visit Plan  For vestibular:  continue to progress duration of gaze adaptation, add functional reaching/bending for habituation to tolerance, add x 2 viewing and VOR cancellation for visual stimulation.    PT Home Exercise Plan  Access Code: [email or web address]  *combined both medbridge packets to one set*  on 11/03/19    Consulted and Agree with Plan of Care  Patient       Patient will benefit from skilled therapeutic intervention in order to improve the following deficits and impairments:  Decreased balance, Dizziness, Decreased strength, Postural dysfunction, Hypomobility, Impaired flexibility, Pain, Increased muscle spasms, Increased fascial restricitons  Visit Diagnosis: Dizziness and giddiness  Unsteadiness on feet     Problem List Patient Active Problem  List   Diagnosis Date Noted  . Viral syndrome 09/19/2019  . Elevated PTHrP level 03/31/2018  . Muscle spasm of back 12/13/2017  . Subconjunctival hematoma, left 09/14/2017  . Vertigo 05/11/2017  . Hyperlipidemia 03/15/2017  . Lumbar degenerative disc disease 05/25/2016  . Varicose vein of leg 06/04/2015  . Patellofemoral pain syndrome 06/04/2015  . Overactive bladder 09/24/2014  . Myofascial  pain syndrome 08/27/2014  . Family history of breast cancer 09/12/2013  . Nontoxic multinodular goiter 10/19/2008  . Goiter, nontoxic, multinodular 10/19/2008    Alin Chavira , PT 11/03/2019, 11:56 AM  [facility] [address] [place], [place], [address] Phone: [phone]   Fax:  [phone]  Name: Dwain SarnaHeather Louise [name] MRN: [id number] Date of Birth: 1981-03-19

## 2019-11-07 ENCOUNTER — Encounter: Payer: Federal, State, Local not specified - PPO | Admitting: Physical Therapy

## 2019-11-15 ENCOUNTER — Encounter: Payer: Self-pay | Admitting: Physical Therapy

## 2019-11-15 ENCOUNTER — Other Ambulatory Visit: Payer: Self-pay

## 2019-11-15 ENCOUNTER — Ambulatory Visit (INDEPENDENT_AMBULATORY_CARE_PROVIDER_SITE_OTHER): Payer: Federal, State, Local not specified - PPO | Admitting: Physical Therapy

## 2019-11-15 DIAGNOSIS — M545 Low back pain, unspecified: Secondary | ICD-10-CM

## 2019-11-15 DIAGNOSIS — R42 Dizziness and giddiness: Secondary | ICD-10-CM

## 2019-11-15 NOTE — Patient Instructions (Signed)
Access Code: [email or web address]  URL: [email or web address]  Date: 11/15/2019  Prepared by: Almyra Free Jairo Bellew   Exercises Seated Eye and Head Movement Coordination - Side to Side - 10 reps - 1 sets - 2x daily - 7x weekly Romberg Stance with Head Nods on Foam Pad - 1 reps - 1 sets - 10 rotations each way - 2x daily - 7x weekly Standing Gaze Stabilization with Head Rotation - 2-3 sets - 45 Seconds - 3x daily - 7x weekly Standing Hip Abduction - 10 reps - 3 sets - 1x daily - 7x weekly Sidelying Open Book Thoracic Lumbar Rotation and Extension - 10 reps - 1-2 sets - 1x daily - 7x weekly Supine Lower Trunk Rotation - 2-3 reps - 1 sets - 15 hold - 1x daily                            - 7x weekly Marching Bridge - 5 reps - 3 sets - 1x daily - 7x weekly Seated Cervical Retraction - 10 reps - 1 sets - 3-5 sec hold - 3x daily - 7x weekly Alternating Single Leg Bridge - 5 reps - 3 sets - 1x daily - 7x weekly Thoracic Extension Mobilization on Foam Roll - 5 reps - 2 sets - 30 sec hold - 2x daily - 7x weekly Standard Plank - 5 reps - 1 sets - 1x daily - 7x weekly Prone Alternating Arm and Leg Lifts - 10 reps - 3 sets - 5 sec hold                            - 1x daily - 7x weekly

## 2019-11-15 NOTE — Therapy (Signed)
[facility] [address] [place], [place], [address] Phone: [phone]   Fax:  [phone]  Physical Therapy Treatment  Patient Details  Name: Dawn [name] MRN: [id number] Date of Birth: 05-16-1981 Referring Provider (PT): Monica Becton, MD   Encounter Date: 11/15/2019  PT End of Session - 11/15/19 1150    Visit Number  11    Number of Visits  16    Date for PT Re-Evaluation  12/06/19    PT Start Time  1150    PT Stop Time  1234    PT Time Calculation (min)  44 min    Activity Tolerance  Patient tolerated treatment well    Behavior During Therapy  [facility] for tasks assessed/performed       Past Medical History:  Diagnosis Date  . AMA (advanced maternal age) multigravida 35+ 08/12/2018  . Anemia    with pregnancy  . Benign paroxysmal positional vertigo 05/11/2017  . Depression    does not currently take meds  . Goiter, nontoxic, multinodular 10/19/2008   Overview:  Nontoxic Multinodular Goiter Nml labs, [place]  . Herniated cervical disc 2016  . Hip pain   . Hyperlipidemia 03/15/2017  . Lumbar degenerative disc disease 05/25/2016  . Muscle spasm of back 12/13/2017  . Myofascial pain syndrome 08/27/2014   negative cervical spine MRI, a negative brain MRI, nerve conduction and electromyography, and normal extensive blood work for peripheral neuropathy and myopathy.   . Nontoxic multinodular goiter 10/19/2008   Overview:  Nontoxic Multinodular Goiter  . Overactive bladder 09/24/2014  . Patellofemoral pain syndrome 06/04/2015  . Right tennis elbow   . Subconjunctival hematoma, left 09/14/2017  . Supervision of other high risk pregnancy, antepartum 08/12/2018    Nursing Staff Provider Office Location  [facility] Dating  10/LMP Language  English Anatomy US   normal Flu Vaccine  08/26/18 Genetic Screen  NIPS:Negative  AFP:  Normal  First Screen:  Quad:   TDaP vaccine   12/16/18 Hgb A1C or  GTT Early  Third trimester  Rhogam   NA   LAB RESULTS  Feeding Plan  Breast Blood Type AB/RH(D) POSITIVE/-- (04/18 9702)  Contraception  NFP Antibody NO ANTIBODIES DETECTED (04/  . Thyroid goiter    benign  . Varicose vein of leg 06/04/2015    Past Surgical History:  Procedure Laterality Date  . TONSILLECTOMY  1986    There were no vitals filed for this visit.  Subjective Assessment - 11/15/19 1151    Subjective  Patient reports that she is mainly feeling stiffness in the morning, but her hips are feeling tight with stretches. She no longer is having spasms.    Pertinent History  vertigo, BPPV, herniated cervical disc 2016, lumbar DDD    Currently in Pain?  Yes    Pain Score  2     Pain Location  Back    Pain Orientation  Mid;Lower    Pain Descriptors / Indicators  Aching                       [facility] Adult PT Treatment/Exercise - 11/15/19 0001      Lumbar Exercises: Stretches   Single Knee to Chest Stretch  Left;Right;1 rep;20 seconds    Double Knee to Chest Stretch  1 rep;10 seconds    Quadruped Mid Back Stretch  1 rep;20 seconds    Quadruped Mid Back Stretch Limitations  childs pose  Lumbar Exercises: Standing   Other Standing Lumbar Exercises  anti rotation with red band (server position and small rotation x 10 ea bil)      Lumbar Exercises: Supine   Pelvic Tilt  5 reps    Pelvic Tilt Limitations  for use for morning stiffness      Lumbar Exercises: Prone   Straight Leg Raise  10 reps   bil   Opposite Arm/Leg Raise  Right arm/Left leg;Left arm/Right leg;10 reps   bil     Lumbar Exercises: Quadruped   Straight Leg Raise  10 reps   bil   Straight Leg Raises Limitations  then toe tap extension x 10 bil; weakness in right lumbar; pt unable to maintain level pelvis without cueing so HEP modified      Manual Therapy   Manual Therapy  Soft tissue mobilization;Joint mobilization    Manual therapy comments  skilled palpation and monitoring of soft tissue during DN     Joint Mobilization   PA gd II/III to lumbar    Soft tissue mobilization  to bil cervical and lumbar paraspinals       Trigger Point Dry Needling - 11/15/19 0001    Consent Given?  Yes    Education Handout Provided  Previously provided    Muscles Treated Head and Neck  Suboccipitals;Cervical multifidi    Muscles Treated Back/Hip  Erector spinae;Lumbar multifidi    SubOccipitals Response  Palpable increased muscle length    Cervical multifidi Response  Twitch reponse elicited;Palpable increased muscle length    Erector spinae Response  Twitch response elicited;Palpable increased muscle length    Lumbar multifidi Response  Twitch response elicited;Palpable increased muscle length           PT Education - 11/15/19 1305    Education Details  HEP Modified to take out quadriped and replace with prone lumbar stab    Person(s) Educated  Patient    Methods  Explanation;Demonstration;Handout    Comprehension  Verbalized understanding;Returned demonstration          PT Long Term Goals - 11/15/19 1153      PT LONG TERM GOAL #1   Title  independent with HEP    Baseline  pt reports she is trying to be more compliant    Status  On-going      PT LONG TERM GOAL #2   Title  report 75% improvement in symptoms    Status  On-going      PT LONG TERM GOAL #4   Title  report back pain < 3/10 with activity for improved function    Baseline  stays around a 2-3/10    Status  Achieved      PT LONG TERM GOAL #5   Title  report 50% improvement in LBP and spasms for improved function    Status  Achieved            Plan - 11/15/19 1307    Clinical Impression Statement  Patient is progressing with goals meeting 2 LTGs today. She is still having significant stiffness and some pain in low back. She reports increased compliance with HEP but still not fully compliant. She is still weak in lumbar stabilizers. Very good response today with DN in lumbar and cervical spine.    PT Treatment/Interventions  ADLs/Self  Care Home Management;Canalith Repostioning;Balance training;Therapeutic exercise;Therapeutic activities;Vestibular;Functional mobility training;Stair training;Gait training;Neuromuscular re-education;Patient/family education;Cryotherapy;Electrical Stimulation;Ultrasound;Moist Heat;Traction;Manual techniques;Dry needling;Taping    PT Next Visit Plan  For vestibular:  continue to  progress duration of gaze adaptation, add functional reaching/bending for habituation to tolerance, add x 2 viewing and VOR cancellation for visual stimulation.; For LB continue lumbar stabilization; DN prn.    PT Home Exercise Plan  Access Code: [email or web address]  *combined both medbridge packets to one set*  on 11/03/19    Consulted and Agree with Plan of Care  Patient       Patient will benefit from skilled therapeutic intervention in order to improve the following deficits and impairments:  Decreased balance, Dizziness, Decreased strength, Postural dysfunction, Hypomobility, Impaired flexibility, Pain, Increased muscle spasms, Increased fascial restricitons  Visit Diagnosis: Acute midline low back pain without sciatica  Dizziness and giddiness     Problem List Patient Active Problem List   Diagnosis Date Noted  . Viral syndrome 09/19/2019  . Elevated PTHrP level 03/31/2018  . Muscle spasm of back 12/13/2017  . Subconjunctival hematoma, left 09/14/2017  . Vertigo 05/11/2017  . Hyperlipidemia 03/15/2017  . Lumbar degenerative disc disease 05/25/2016  . Varicose vein of leg 06/04/2015  . Patellofemoral pain syndrome 06/04/2015  . Overactive bladder 09/24/2014  . Myofascial pain syndrome 08/27/2014  . Family history of breast cancer 09/12/2013  . Nontoxic multinodular goiter 10/19/2008  . Goiter, nontoxic, multinodular 10/19/2008    Solon Palm PT 11/15/2019, 1:10 PM  [facility] [address] [place], [place], [address] Phone: [phone]   Fax:   [phone]  Name: Dawn [name] MRN: [id number] Date of Birth: September 24, 1981

## 2019-11-16 DIAGNOSIS — Z7289 Other problems related to lifestyle: Secondary | ICD-10-CM | POA: Diagnosis not present

## 2019-11-16 DIAGNOSIS — G43809 Other migraine, not intractable, without status migrainosus: Secondary | ICD-10-CM | POA: Diagnosis not present

## 2019-11-17 ENCOUNTER — Other Ambulatory Visit: Payer: Self-pay

## 2019-11-17 ENCOUNTER — Ambulatory Visit (INDEPENDENT_AMBULATORY_CARE_PROVIDER_SITE_OTHER): Payer: Federal, State, Local not specified - PPO | Admitting: Rehabilitative and Restorative Service Providers"

## 2019-11-17 ENCOUNTER — Encounter: Payer: Self-pay | Admitting: Rehabilitative and Restorative Service Providers"

## 2019-11-17 DIAGNOSIS — R42 Dizziness and giddiness: Secondary | ICD-10-CM

## 2019-11-17 DIAGNOSIS — R2681 Unsteadiness on feet: Secondary | ICD-10-CM | POA: Diagnosis not present

## 2019-11-17 NOTE — Patient Instructions (Signed)
Access Code: [email or web address]  URL: [email or web address]  Date: 11/17/2019  Prepared by: Rudell Cobb   Exercises Romberg Stance with Head Nods on Foam Pad - 1 reps - 1 sets - 10 rotations each way - 2x daily - 7x weekly Romberg Stance Eyes Closed on Foam Pad - 3 reps - 3 sets - 30 seconds hold - 2x daily - 7x weekly Standing Gaze Stabilization with Head Rotation - 2-3 sets - 45 Seconds - 3x daily - 7x weekly Standing Gaze Stabilization with Head Rotation and Horizontal Arm Movement - 10 reps - 2 sets - 2x daily - 7x weekly Diona Foley Toss with Eye Tracking While Walking - 10 reps - 3 sets - 1x daily - 7x weekly Standing Hip Abduction - 10 reps - 3 sets - 1x daily - 7x weekly Sidelying Open Book Thoracic Lumbar Rotation and Extension - 10 reps - 1-2 sets - 1x daily - 7x weekly Supine Lower Trunk Rotation - 2-3 reps - 1 sets - 15 hold - 1x daily                            - 7x weekly Marching Bridge - 5 reps - 3 sets - 1x daily - 7x weekly Seated Cervical Retraction - 10 reps - 1 sets - 3-5 sec hold - 3x daily - 7x weekly Alternating Single Leg Bridge - 5 reps - 3 sets - 1x daily - 7x weekly Thoracic Extension Mobilization on Foam Roll - 5 reps - 2 sets - 30 sec hold - 2x daily - 7x weekly Standard Plank - 5 reps - 1 sets - 1x daily - 7x weekly Prone Alternating Arm and Leg Lifts - 10 reps - 3 sets - 5 sec hold                            - 1x daily - 7x weekly

## 2019-11-17 NOTE — Therapy (Signed)
[facility] [place] [address] [place], [place], [address] Phone: [phone]   Fax:  [phone]  Physical Therapy Treatment  Patient Details  Name: Dawn [name] MRN: [id number] Date of Birth: 11-21-1981 Referring Provider (PT): Monica Becton, MD   Encounter Date: 11/17/2019  PT End of Session - 11/17/19 1640    Visit Number  12    Number of Visits  16    Date for PT Re-Evaluation  12/06/19    PT Start Time  1105    PT Stop Time  1140    PT Time Calculation (min)  35 min    Activity Tolerance  Patient tolerated treatment well    Behavior During Therapy  [facility] for tasks assessed/performed       Past Medical History:  Diagnosis Date  . AMA (advanced maternal age) multigravida 35+ 08/12/2018  . Anemia    with pregnancy  . Benign paroxysmal positional vertigo 05/11/2017  . Depression    does not currently take meds  . Goiter, nontoxic, multinodular 10/19/2008   Overview:  Nontoxic Multinodular Goiter Nml labs, [place]  . Herniated cervical disc 2016  . Hip pain   . Hyperlipidemia 03/15/2017  . Lumbar degenerative disc disease 05/25/2016  . Muscle spasm of back 12/13/2017  . Myofascial pain syndrome 08/27/2014   negative cervical spine MRI, a negative brain MRI, nerve conduction and electromyography, and normal extensive blood work for peripheral neuropathy and myopathy.   . Nontoxic multinodular goiter 10/19/2008   Overview:  Nontoxic Multinodular Goiter  . Overactive bladder 09/24/2014  . Patellofemoral pain syndrome 06/04/2015  . Right tennis elbow   . Subconjunctival hematoma, left 09/14/2017  . Supervision of other high risk pregnancy, antepartum 08/12/2018    Nursing Staff Provider Office Location  [facility] Dating  10/LMP Language  English Anatomy US   normal Flu Vaccine  08/26/18 Genetic Screen  NIPS:Negative  AFP:  Normal  First Screen:  Quad:   TDaP vaccine   12/16/18 Hgb A1C or  GTT Early  Third trimester  Rhogam   NA   LAB RESULTS  Feeding Plan  Breast Blood Type AB/RH(D) POSITIVE/-- (04/18 1962)  Contraception  NFP Antibody NO ANTIBODIES DETECTED (04/  . Thyroid goiter    benign  . Varicose vein of leg 06/04/2015    Past Surgical History:  Procedure Laterality Date  . TONSILLECTOMY  1986    There were no vitals filed for this visit.  Subjective Assessment - 11/17/19 1110    Subjective  The patient is trying to move more naturally in day to day function.  She had a mild dizziness this morning and had the start of a headache.    Pertinent History  vertigo, BPPV, herniated cervical disc 2016, lumbar DDD    Patient Stated Goals  get rid of pain    Currently in Pain?  No/denies    Pain Score  --   "it has been really good today"                      [facility] Adult PT Treatment/Exercise - 11/17/19 1642      Neuro Re-ed    Neuro Re-ed Details   on compliant surfaces with eyes closed x 30 seconds x 3 reps; ball toss with dynamic gait, standing moving head horizontal plane while passing ball L<>R with mild symptom provocation.      Vestibular Treatment/Exercise - 11/17/19 1118      Vestibular  Treatment/Exercise   Vestibular Treatment Provided  Habituation;Gaze    Habituation Exercises  Standing Horizontal Head Turns;Standing Vertical Head Turns    Gaze Exercises  X1 Viewing Horizontal;X1 Viewing Vertical;Eye/Head Exercise Horizontal      Standing Horizontal Head Turns   Number of Reps   10    Symptom Description   on compliant surfaces      Standing Vertical Head Turns   Number of Reps   10    Symptom Description   on compliant surfaces      X1 Viewing Horizontal   Foot Position  standing feet apart    Comments  x 45 seconds with cues for larger amplitude      X1 Viewing Vertical   Foot Position  feet apart standing    Comments  30 seconds with mild increase in symptoms      X2 Viewing Horizontal   Foot Position  stnading feet apart     Comments  increases symptoms--  challenging to coordinate      Eye/Head Exercise Horizontal   Foot Position  seated            PT Education - 11/17/19 1639    Education Details  progressed balance and vestibular HEP    Person(s) Educated  Patient    Methods  Explanation;Demonstration;Handout    Comprehension  Verbalized understanding;Returned demonstration          PT Long Term Goals - 11/17/19 1641      PT LONG TERM GOAL #1   Title  independent with HEP    Baseline  pt reports she is trying to be more compliant    Status  On-going      PT LONG TERM GOAL #2   Title  report 75% improvement in symptoms    Baseline  (vestibular symptoms)    Status  On-going      PT LONG TERM GOAL #4   Title  report back pain < 3/10 with activity for improved function    Baseline  stays around a 2-3/10    Status  Achieved      PT LONG TERM GOAL #5   Title  report 50% improvement in LBP and spasms for improved function    Status  Achieved            Plan - 11/17/19 1643    Clinical Impression Statement  The patient is progressing with vestibular exercises and x 2 VOR added, progressed balance HEP and continuing with habituation.  Plan to continue working to [place] progressing HEP to tolerance.    PT Treatment/Interventions  ADLs/Self Care Home Management;Canalith Repostioning;Balance training;Therapeutic exercise;Therapeutic activities;Vestibular;Functional mobility training;Stair training;Gait training;Neuromuscular re-education;Patient/family education;Cryotherapy;Electrical Stimulation;Ultrasound;Moist Heat;Traction;Manual techniques;Dry needling;Taping    PT Next Visit Plan  For vestibular:  continue to progress duration of gaze adaptation, add functional reaching/bending for habituation to tolerance, add x 2 viewing and VOR cancellation for visual stimulation.; For LB continue lumbar stabilization; DN prn.    PT Home Exercise Plan  Access Code: [email or web address]  *combined both medbridge packets to one set*  on 11/03/19     Consulted and Agree with Plan of Care  Patient       Patient will benefit from skilled therapeutic intervention in order to improve the following deficits and impairments:  Decreased balance, Dizziness, Decreased strength, Postural dysfunction, Hypomobility, Impaired flexibility, Pain, Increased muscle spasms, Increased fascial restricitons  Visit Diagnosis: Dizziness and giddiness  Unsteadiness on feet     Problem List Patient Active  Problem List   Diagnosis Date Noted  . Viral syndrome 09/19/2019  . Elevated PTHrP level 03/31/2018  . Muscle spasm of back 12/13/2017  . Subconjunctival hematoma, left 09/14/2017  . Vertigo 05/11/2017  . Hyperlipidemia 03/15/2017  . Lumbar degenerative disc disease 05/25/2016  . Varicose vein of leg 06/04/2015  . Patellofemoral pain syndrome 06/04/2015  . Overactive bladder 09/24/2014  . Myofascial pain syndrome 08/27/2014  . Family history of breast cancer 09/12/2013  . Nontoxic multinodular goiter 10/19/2008  . Goiter, nontoxic, multinodular 10/19/2008    Vinisha Faxon , PT 11/17/2019, 4:44 PM  [facility] [address] [place], [place], [address] Phone: [phone]   Fax:  [phone]  Name: Dawn [name] MRN: [id number] Date of Birth: 10-Oct-1981

## 2019-11-20 ENCOUNTER — Encounter: Payer: Self-pay | Admitting: Obstetrics & Gynecology

## 2019-11-20 ENCOUNTER — Other Ambulatory Visit: Payer: Self-pay

## 2019-11-20 ENCOUNTER — Ambulatory Visit: Payer: Federal, State, Local not specified - PPO | Admitting: Obstetrics & Gynecology

## 2019-11-20 VITALS — BP 134/79 | HR 96 | Temp 98.2°F | Ht 65.0 in | Wt 152.0 lb

## 2019-11-20 DIAGNOSIS — R200 Anesthesia of skin: Secondary | ICD-10-CM

## 2019-11-20 DIAGNOSIS — Z23 Encounter for immunization: Secondary | ICD-10-CM | POA: Diagnosis not present

## 2019-11-20 NOTE — Progress Notes (Signed)
   Subjective:    Patient ID: Dawn [name], female    DOB: 1981-05-18, 38 y.o.   MRN: [id number]  HPI 38 yo P2 here with a 3 month h/o a numbness of the skin between the umbilicus and and the xyphoid process. She has some occasional constipation, but no other problems.   Review of Systems     Objective:   Physical Exam Breathing, conversing, and ambulating normally Well nourished, well hydrated White female, no apparent distress Abd- benign Entirely normal exam, no hernia       Assessment & Plan:  Skin numbness- she will be seeing a neurologist next month about her migraines and I have suggested that she discuss this with the neurologist also.

## 2019-11-22 ENCOUNTER — Encounter: Payer: Self-pay | Admitting: Rehabilitative and Restorative Service Providers"

## 2019-11-22 ENCOUNTER — Encounter: Payer: Self-pay | Admitting: Physical Therapy

## 2019-11-22 ENCOUNTER — Ambulatory Visit (INDEPENDENT_AMBULATORY_CARE_PROVIDER_SITE_OTHER): Payer: Federal, State, Local not specified - PPO | Admitting: Rehabilitative and Restorative Service Providers"

## 2019-11-22 ENCOUNTER — Other Ambulatory Visit: Payer: Self-pay

## 2019-11-22 DIAGNOSIS — R42 Dizziness and giddiness: Secondary | ICD-10-CM | POA: Diagnosis not present

## 2019-11-22 DIAGNOSIS — R2681 Unsteadiness on feet: Secondary | ICD-10-CM | POA: Diagnosis not present

## 2019-11-22 NOTE — Therapy (Signed)
[place], [place], [address] Phone: [phone]   Fax:  [phone]  Physical Therapy Treatment  Patient Details  Name: Dawn [name] MRN: [id number] Date of Birth: 1981/06/25 Referring Provider (PT): Silverio Decamp, MD   Encounter Date: 11/22/2019  PT End of Session - 11/22/19 1020    Visit Number  13    Number of Visits  16    Date for PT Re-Evaluation  12/06/19    PT Start Time  1016    PT Stop Time  1045    PT Time Calculation (min)  29 min    Activity Tolerance  Patient tolerated treatment well    Behavior During Therapy  [facility] for tasks assessed/performed       Past Medical History:  Diagnosis Date  . AMA (advanced maternal age) multigravida 35+ 08/12/2018  . Anemia    with pregnancy  . Benign paroxysmal positional vertigo 05/11/2017  . Depression    does not currently take meds  . Goiter, nontoxic, multinodular 10/19/2008   Overview:  Nontoxic Multinodular Goiter Nml labs, [place]  . Herniated cervical disc 2016  . Hip pain   . Hyperlipidemia 03/15/2017  . Lumbar degenerative disc disease 05/25/2016  . Muscle spasm of back 12/13/2017  . Myofascial pain syndrome 08/27/2014   negative cervical spine MRI, a negative brain MRI, nerve conduction and electromyography, and normal extensive blood work for peripheral neuropathy and myopathy.   . Nontoxic multinodular goiter 10/19/2008   Overview:  Nontoxic Multinodular Goiter  . Overactive bladder 09/24/2014  . Patellofemoral pain syndrome 06/04/2015  . Right tennis elbow   . Subconjunctival hematoma, left 09/14/2017  . Supervision of other high risk pregnancy, antepartum 08/12/2018    Nursing Staff Provider Office Location  [facility] Dating  10/LMP Language  English Anatomy US   normal Flu Vaccine  08/26/18 Genetic Screen  NIPS:Negative  AFP:  Normal  First Screen:  Quad:   TDaP vaccine   12/16/18 Hgb A1C or  GTT Early  Third trimester  Rhogam   NA   LAB RESULTS  Feeding Plan  Breast Blood Type AB/RH(D) POSITIVE/-- (04/18 9532)  Contraception  NFP Antibody NO ANTIBODIES DETECTED (04/  . Thyroid goiter    benign  . Varicose vein of leg 06/04/2015    Past Surgical History:  Procedure Laterality Date  . TONSILLECTOMY  1986    There were no vitals filed for this visit.  Subjective Assessment - 11/22/19 1018    Subjective  The patient reports she may be getting a mild HA today.  She feels improvement with dizziness.  HEP activities that continue to provoke symptoms are ball toss (brings on sway), and x 2 viewing.    Pertinent History  vertigo, BPPV, herniated cervical disc 2016, lumbar DDD    Patient Stated Goals  get rid of pain    Currently in Pain?  No/denies                       [facility] Adult PT Treatment/Exercise - 11/22/19 1236      Self-Care   Self-Care  Other Self-Care Comments    Other Self-Care Comments   Educated patient on self mgmt of dizziness and vertigo due to h/o symptoms.  Discussed continuing current HEP x 30 days, then reducing frequency to 2x/week x 30 days.  As her vestibular system adapts, she should be able to maintain functional movement without increasing symptoms.  Neuro Re-ed    Neuro Re-ed Details   Ball toss with walking with visual stimulation discussed recommending continuation of this for HEP.  Also performed eyes closed on compliant surfaces to increase use of vestibular inputs for balance.        Vestibular Treatment/Exercise - 11/22/19 0001      Vestibular Treatment/Exercise   Vestibular Treatment Provided  Habituation;Gaze    Habituation Exercises  Standing Horizontal Head Turns;Standing Vertical Head Turns    Gaze Exercises  X1 Viewing Horizontal;X2 Viewing Horizontal      Standing Horizontal Head Turns   Number of Reps   10    Symptom Description   with eyes closed on compliant surfaces      Standing Vertical Head Turns   Number of Reps   10    Symptom  Description   with eyes closed on compliant surfaces      X1 Viewing Horizontal   Foot Position  standing feet apart    Comments  x 60 seconds with increasing speed with 2/10 dizziness at end of 1 minute.      X2 Viewing Horizontal   Foot Position  standing feet apart     Comments  mild increase in symptoms, showing improved coordination                 PT Long Term Goals - 11/22/19 1021      PT LONG TERM GOAL #1   Title  independent with HEP    Baseline  pt reports she is trying to be more compliant    Status  On-going      PT LONG TERM GOAL #2   Title  report 75% improvement in symptoms    Baseline  atleast 50% improvement in dizziness symptoms; she can drive without dizziness for the last week.  She has less HA since reducing decaf beverage intake.    Status  Partially Met      PT LONG TERM GOAL #3   Title  report symptoms </= 2/10 dizziness with functional activities for improved symptoms    Baseline  0/10 dizziness with daily activities at home and work.    Status  Achieved      PT LONG TERM GOAL #4   Title  report back pain < 3/10 with activity for improved function    Baseline  stays around a 2-3/10    Status  Achieved      PT LONG TERM GOAL #5   Title  report 50% improvement in LBP and spasms for improved function    Status  Achieved            Plan - 11/22/19 1238    Clinical Impression Statement  The patient met LTG experiencing 0/10 dizziness during functional and work activities.  She feels overall at least 50% improvement with vestibular symptoms.  She has not driven for long distances and is unsure of her tolerance to this task (which was previously provocative of vertigo).  PT recommended we hold therapy x 30 days with her continuing to progress HEP for vestibular exercises.  PT educated her on vestibular adaptation goals, when to begin exercises if she experiences symptoms, etc.  Patient has 2 more visits scheduled for low back.  Vestibular  therapy on hold.    PT Treatment/Interventions  ADLs/Self Care Home Management;Canalith Repostioning;Balance training;Therapeutic exercise;Therapeutic activities;Vestibular;Functional mobility training;Stair training;Gait training;Neuromuscular re-education;Patient/family education;Cryotherapy;Electrical Stimulation;Ultrasound;Moist Heat;Traction;Manual techniques;Dry needling;Taping    PT Next Visit Plan  For vestibular:  *patient  on hold 30 days (leave chart open and C.Kathlen Mody will d/c on 12/22/2019 if patinet does not have need for further visits).; For LB continue lumbar stabilization; DN prn.    PT Home Exercise Plan  Access Code: [email or web address]  *combined both medbridge packets to one set*  on 11/03/19    Consulted and Agree with Plan of Care  Patient       Patient will benefit from skilled therapeutic intervention in order to improve the following deficits and impairments:  Decreased balance, Dizziness, Decreased strength, Postural dysfunction, Hypomobility, Impaired flexibility, Pain, Increased muscle spasms, Increased fascial restricitons  Visit Diagnosis: Dizziness and giddiness  Unsteadiness on feet     Problem List Patient Active Problem List   Diagnosis Date Noted  . Viral syndrome 09/19/2019  . Elevated PTHrP level 03/31/2018  . Muscle spasm of back 12/13/2017  . Subconjunctival hematoma, left 09/14/2017  . Vertigo 05/11/2017  . Hyperlipidemia 03/15/2017  . Lumbar degenerative disc disease 05/25/2016  . Varicose vein of leg 06/04/2015  . Patellofemoral pain syndrome 06/04/2015  . Overactive bladder 09/24/2014  . Myofascial pain syndrome 08/27/2014  . Family history of breast cancer 09/12/2013  . Nontoxic multinodular goiter 10/19/2008  . Goiter, nontoxic, multinodular 10/19/2008    Richie Vadala, PT 11/22/2019, 12:42 PM  [facility] [place], [place], [address] Phone: [phone]   Fax:   [phone]  Name: Dawn [name] MRN: [id number] Date of Birth: 02-22-1981

## 2019-11-22 NOTE — Patient Instructions (Signed)
Access Code: [email or web address]  URL: [email or web address]  Date: 11/22/2019  Prepared by: Rudell Cobb   Exercises Wide Stance with Eyes Closed and Head Nods on Foam Pad - 10 reps - 2 sets - 2x daily - 7x weekly Wide Stance with Eyes Closed and Head Rotation on Foam Pad - 10 reps - 2 sets - 2x daily - 7x weekly Standing Gaze Stabilization with Head Rotation - 1 sets - 60 reps - 60 seconds hold - 3x daily - 7x weekly Standing Gaze Stabilization with Head Rotation and Horizontal Arm Movement - 10 reps - 2 sets - 2x daily - 7x weekly Diona Foley Toss with Eye Tracking While Walking - 10 reps - 2 sets - 1x daily - 7x weekly Standing Hip Abduction - 10 reps - 3 sets - 1x daily - 7x weekly Sidelying Open Book Thoracic Lumbar Rotation and Extension - 10 reps - 1-2 sets - 1x daily - 7x weekly Supine Lower Trunk Rotation - 2-3 reps - 1 sets - 15 hold - 1x daily                            - 7x weekly Marching Bridge - 5 reps - 3 sets - 1x daily - 7x weekly Seated Cervical Retraction - 10 reps - 1 sets - 3-5 sec hold - 3x daily - 7x weekly Alternating Single Leg Bridge - 5 reps - 3 sets - 1x daily - 7x weekly Wide Stance with Eyes Closed and Head Rotation on Foam Pad - 10 reps - 3 sets - 1x daily - 7x weekly Thoracic Extension Mobilization on Foam Roll - 5 reps - 2 sets - 30 sec hold - 2x daily - 7x weekly Standard Plank - 5 reps - 1 sets - 1x daily - 7x weekly Prone Alternating Arm and Leg Lifts - 10 reps - 3 sets - 5 sec hold                            - 1x daily - 7x weekly

## 2019-11-23 ENCOUNTER — Encounter: Payer: Self-pay | Admitting: Physical Therapy

## 2019-11-23 ENCOUNTER — Ambulatory Visit (INDEPENDENT_AMBULATORY_CARE_PROVIDER_SITE_OTHER): Payer: Federal, State, Local not specified - PPO | Admitting: Physical Therapy

## 2019-11-23 DIAGNOSIS — M545 Low back pain, unspecified: Secondary | ICD-10-CM

## 2019-11-23 NOTE — Therapy (Signed)
[place], [place], [address] Phone: [phone]   Fax:  [phone]  Physical Therapy Treatment  Patient Details  Name: Dawn [name] MRN: [id number] Date of Birth: September 07, 1981 Referring Provider (PT): Silverio Decamp, MD   Encounter Date: 11/23/2019  PT End of Session - 11/23/19 1150    Visit Number  14    Number of Visits  16    Date for PT Re-Evaluation  12/06/19    PT Start Time  1443    PT Stop Time  1233    PT Time Calculation (min)  44 min    Activity Tolerance  Patient tolerated treatment well    Behavior During Therapy  [facility] for tasks assessed/performed       Past Medical History:  Diagnosis Date  . AMA (advanced maternal age) multigravida 35+ 08/12/2018  . Anemia    with pregnancy  . Benign paroxysmal positional vertigo 05/11/2017  . Depression    does not currently take meds  . Goiter, nontoxic, multinodular 10/19/2008   Overview:  Nontoxic Multinodular Goiter Nml labs, [place]  . Herniated cervical disc 2016  . Hip pain   . Hyperlipidemia 03/15/2017  . Lumbar degenerative disc disease 05/25/2016  . Muscle spasm of back 12/13/2017  . Myofascial pain syndrome 08/27/2014   negative cervical spine MRI, a negative brain MRI, nerve conduction and electromyography, and normal extensive blood work for peripheral neuropathy and myopathy.   . Nontoxic multinodular goiter 10/19/2008   Overview:  Nontoxic Multinodular Goiter  . Overactive bladder 09/24/2014  . Patellofemoral pain syndrome 06/04/2015  . Right tennis elbow   . Subconjunctival hematoma, left 09/14/2017  . Supervision of other high risk pregnancy, antepartum 08/12/2018    Nursing Staff Provider Office Location  [facility] Dating  10/LMP Language  English Anatomy US   normal Flu Vaccine  08/26/18 Genetic Screen  NIPS:Negative  AFP:  Normal  First Screen:  Quad:   TDaP vaccine   12/16/18 Hgb A1C or  GTT Early  Third trimester  Rhogam   NA   LAB RESULTS  Feeding Plan  Breast Blood Type AB/RH(D) POSITIVE/-- (04/18 1540)  Contraception  NFP Antibody NO ANTIBODIES DETECTED (04/  . Thyroid goiter    benign  . Varicose vein of leg 06/04/2015    Past Surgical History:  Procedure Laterality Date  . TONSILLECTOMY  1986    There were no vitals filed for this visit.  Subjective Assessment - 11/23/19 1150    Subjective  Patient just reporting stiffness since last visit.    Pertinent History  vertigo, BPPV, herniated cervical disc 2016, lumbar DDD    Patient Stated Goals  get rid of pain    Currently in Pain?  No/denies                       [facility] Adult PT Treatment/Exercise - 11/23/19 0001      Exercises   Exercises  Lumbar;Neck      Neck Exercises: Standing   Lift / Chop  10 reps    Left / Chop Limitations  bil with 5#     Other Standing Exercises  --      Lumbar Exercises: Standing   Functional Squats  10 reps    Functional Squats Limitations  to mat    Lifting  From floor;5 reps    Lifting Weights (lbs)  20    Lifting Limitations  to waist high  shelf    Forward Lunge  10 reps    Forward Lunge Limitations  with 5#; weak hip ER left, right weaker overall    Other Standing Lumbar Exercises  antirotation server position x 10 bil red band; step outs x 10 ea       Lumbar Exercises: Supine   AB Set Limitations  in 90/90 position x 5 sec    Bridge  5 reps    Bridge Limitations  --    Bridge with March  10 reps    Bridge with [employer] Limitations  bil     Single Leg Bridge  5 reps    Bridge with [employer] Limitations  bil poor form so stopped SL bridge    Other Supine Lumbar Exercises  table top with alt leg press 2x5      Lumbar Exercises: Quadruped   [place] Horse  5 reps    Plank  2 x 10 sec    Other Quadruped Lumbar Exercises  childs pose x 2 reps     Other Quadruped Lumbar Exercises  side plank x 2 reps bil. pain into neck with right side plank             PT Education -  11/23/19 1246    Education Details  progressed HEP for low back    Person(s) Educated  Patient    Methods  Explanation;Demonstration;Handout    Comprehension  Verbalized understanding;Returned demonstration          PT Long Term Goals - 11/23/19 1151      PT LONG TERM GOAL #1   Title  independent with HEP    Status  Partially Met      PT LONG TERM GOAL #4   Title  report back pain < 3/10 with activity for improved function    Baseline  no pain reported with activity at this time    Status  Achieved      PT LONG TERM GOAL #5   Title  report 50% improvement in LBP and spasms for improved function    Status  Achieved            Plan - 11/23/19 1247    Clinical Impression Statement  Patient has met her LTGs regarding her low back. She still has stiffness and experienced some pain today bil with single leg dead modified dead lift. She did well with standing functional exercises and lifting demonstrating good form except some left hip weakness with forward lunge. She needs some VCs to slow down with stabilization exercises.    PT Frequency  2x / week    PT Duration  6 weeks    PT Treatment/Interventions  ADLs/Self Care Home Management;Canalith Repostioning;Balance training;Therapeutic exercise;Therapeutic activities;Vestibular;Functional mobility training;Stair training;Gait training;Neuromuscular re-education;Patient/family education;Cryotherapy;Electrical Stimulation;Ultrasound;Moist Heat;Traction;Manual techniques;Dry needling;Taping    PT Next Visit Plan  Review LB HEP and prepare for D/C.    PT Home Exercise Plan  Access Code: [email or web address]  *combined both medbridge packets to one set*  on 11/03/19    Consulted and Agree with Plan of Care  Patient       Patient will benefit from skilled therapeutic intervention in order to improve the following deficits and impairments:  Decreased balance, Dizziness, Decreased strength, Postural dysfunction, Hypomobility, Impaired flexibility,  Pain, Increased muscle spasms, Increased fascial restricitons  Visit Diagnosis: Acute midline low back pain without sciatica     Problem List Patient Active Problem List   Diagnosis Date Noted  . Viral  syndrome 09/19/2019  . Elevated PTHrP level 03/31/2018  . Muscle spasm of back 12/13/2017  . Subconjunctival hematoma, left 09/14/2017  . Vertigo 05/11/2017  . Hyperlipidemia 03/15/2017  . Lumbar degenerative disc disease 05/25/2016  . Varicose vein of leg 06/04/2015  . Patellofemoral pain syndrome 06/04/2015  . Overactive bladder 09/24/2014  . Myofascial pain syndrome 08/27/2014  . Family history of breast cancer 09/12/2013  . Nontoxic multinodular goiter 10/19/2008  . Goiter, nontoxic, multinodular 10/19/2008    Madelyn Flavors PT  11/23/2019, 1:27 PM  [facility] [place], [place], [address] Phone: [phone]   Fax:  [phone]  Name: Rosalynd Mcwright MRN: [id number] Date of Birth: 1981/10/24

## 2019-11-23 NOTE — Patient Instructions (Signed)
Access Code: [email or web address]  URL: [email or web address]  Date: 11/23/2019  Prepared by: Madelyn Flavors   Exercises Wide Stance with Eyes Closed and Head Nods on Foam Pad - 10 reps - 2 sets - 2x daily - 7x weekly Wide Stance with Eyes Closed and Head Rotation on Foam Pad - 10 reps - 2 sets - 2x daily - 7x weekly Standing Gaze Stabilization with Head Rotation - 1 sets - 60 reps - 60 seconds hold - 3x daily - 7x weekly Standing Gaze Stabilization with Head Rotation and Horizontal Arm Movement - 10 reps - 2 sets - 2x daily - 7x weekly Wide Stance with Eyes Closed and Head Rotation on Foam Pad - 10 reps - 3 sets - 1x daily - 7x weekly Diona Foley Toss with Eye Tracking While Walking - 10 reps - 2 sets - 1x daily - 7x weekly Seated Cervical Retraction - 10 reps - 1 sets - 3-5 sec hold - 3x daily - 7x weekly Cat-Camel to Child's Pose - 10 reps - 3 sets - 1x daily - 7x weekly Standard Plank - 5 reps - 1 sets - 1x daily - 7x weekly Prone Alternating Arm and Leg Lifts - 10 reps - 3 sets - 5 sec hold                            - 1x daily - 7x weekly Supine 90/90 Abdominal Bracing - 10 reps - 3 sets - 1x daily - 7x weekly Standing Hip Abduction - 10 reps - 3 sets - 1x daily - 3x weekly Sidelying Open Book Thoracic Lumbar Rotation and Extension - 10 reps - 1-2 sets - 1x daily - 3x weekly Supine Lower Trunk Rotation - 2-3 reps - 1 sets - 15 hold - 1x daily                            - 3x weekly Marching Bridge - 5 reps - 3 sets - 1x daily - 3x weekly Thoracic Extension Mobilization on Foam Roll - 5 reps - 2 sets - 30 sec hold - 2x daily - 3x weekly Standing Diagonal Chops with Medicine Ball - 10 reps - 3 sets - 1x daily - 3x weekly Standard Lunge - 10 reps - 3 sets - 1x daily - 3x weekly Squat with Chair Touch - 10 reps - 3 sets - 1x daily - 3x weekly Mini Squat - 10 reps - 3 sets - 1x daily - 3x weekly

## 2019-11-29 ENCOUNTER — Ambulatory Visit (INDEPENDENT_AMBULATORY_CARE_PROVIDER_SITE_OTHER): Payer: Federal, State, Local not specified - PPO | Admitting: Physical Therapy

## 2019-11-29 ENCOUNTER — Other Ambulatory Visit: Payer: Self-pay

## 2019-11-29 DIAGNOSIS — M545 Low back pain, unspecified: Secondary | ICD-10-CM

## 2019-11-29 NOTE — Therapy (Signed)
[facility] [place] [address] [place], [place], [address] Phone: [phone]   Fax:  [phone]  Physical Therapy Treatment  Patient Details  Name: Dawn [name] MRN: [id number] Date of Birth: [birth year] Referring Provider (PT): Monica Becton, MD   Encounter Date: 11/29/2019  PT End of Session - 11/29/19 1105    Visit Number  15    Number of Visits  16    Date for PT Re-Evaluation  12/06/19    PT Start Time  1103    PT Stop Time  1156   heat x 10 min at end of treatment   PT Time Calculation (min)  53 min    Activity Tolerance  Patient tolerated treatment well    Behavior During Therapy  [facility] for tasks assessed/performed       Past Medical History:  Diagnosis Date  . AMA (advanced maternal age) multigravida 35+ 08/12/2018  . Anemia    with pregnancy  . Benign paroxysmal positional vertigo 05/11/2017  . Depression    does not currently take meds  . Goiter, nontoxic, multinodular 10/19/2008   Overview:  Nontoxic Multinodular Goiter Nml labs, [place]  . Herniated cervical disc 2016  . Hip pain   . Hyperlipidemia 03/15/2017  . Lumbar degenerative disc disease 05/25/2016  . Muscle spasm of back 12/13/2017  . Myofascial pain syndrome 08/27/2014   negative cervical spine MRI, a negative brain MRI, nerve conduction and electromyography, and normal extensive blood work for peripheral neuropathy and myopathy.   . Nontoxic multinodular goiter 10/19/2008   Overview:  Nontoxic Multinodular Goiter  . Overactive bladder 09/24/2014  . Patellofemoral pain syndrome 06/04/2015  . Right tennis elbow   . Subconjunctival hematoma, left 09/14/2017  . Supervision of other high risk pregnancy, antepartum 08/12/2018    Nursing Staff Provider Office Location  [facility] [place] Dating  10/LMP Language  English Anatomy US   normal Flu Vaccine  08/26/18 Genetic Screen  NIPS:Negative  AFP:  Normal  First Screen:  Quad:   TDaP vaccine   12/16/18 Hgb A1C or   GTT Early  Third trimester  Rhogam  NA   LAB RESULTS  Feeding Plan  Breast Blood Type AB/RH(D) POSITIVE/-- (04/18 3094)  Contraception  NFP Antibody NO ANTIBODIES DETECTED (04/  . Thyroid goiter    benign  . Varicose vein of leg 06/04/2015    Past Surgical History:  Procedure Laterality Date  . TONSILLECTOMY  1986    There were no vitals filed for this visit.  Subjective Assessment - 11/29/19 1105    Subjective  I hurt (in legs) until yesterday after last visit. Better today. Wasn't painful in back just legs. Back is stiff today. I had a vertigo episode yesterday.    Pertinent History  vertigo, BPPV, herniated cervical disc 2016, lumbar DDD    Patient Stated Goals  get rid of pain    Currently in Pain?  No/denies                       [facility] Adult PT Treatment/Exercise - 11/29/19 0001      Neck Exercises: Standing   Lift / Chop  10 reps    Left / Chop Limitations  bil with 5#       Lumbar Exercises: Stretches   Quad Stretch  Right;Left;30 seconds    Other Lumbar Stretch Exercise  standing and sitting hip flexor stretch Rt 2x30 sec      Lumbar Exercises:  Standing   Functional Squats  10 reps    Functional Squats Limitations  5# to chair    Forward Lunge  10 reps    Wall Slides  10 reps;3 seconds    Other Standing Lumbar Exercises  good mornings 2x10      Lumbar Exercises: Quadruped   Plank  --   attempted side plank on wall but not effective   Other Quadruped Lumbar Exercises  fire hydrant x 10 bil      Modalities   Modalities  Moist Heat      Moist Heat Therapy   Number Minutes Moist Heat  10 Minutes    Moist Heat Location  Lumbar Spine;Cervical      Manual Therapy   Manual Therapy  Soft tissue mobilization;Joint mobilization    Manual therapy comments  skilled palpation and monitoring of soft tissue during DN     Joint Mobilization  PA mobs bil lumbar gd III    Soft tissue mobilization  to bil lumbar/suboccipitals       Trigger Point Dry  Needling - 11/29/19 0001    Consent Given?  Yes    Education Handout Provided  Previously provided    Muscles Treated Head and Neck  Suboccipitals;Upper trapezius    Muscles Treated Back/Hip  Lumbar multifidi    Upper Trapezius Response  Twitch reponse elicited;Palpable increased muscle length    SubOccipitals Response  Twitch response elicited;Palpable increased muscle length    Lumbar multifidi Response  Twitch response elicited;Palpable increased muscle length           PT Education - 11/29/19 1150    Education Details  HEP updated/finalized    Person(s) Educated  Patient    Methods  Explanation;Demonstration;Handout    Comprehension  Verbalized understanding;Returned demonstration          PT Long Term Goals - 11/29/19 1211      PT LONG TERM GOAL #1   Title  independent with HEP    Baseline  pt reports compliance with HEP    Status  Achieved            Plan - 11/29/19 1208    Clinical Impression Statement  Patient presented today with reports of stiffness in low back and requesting dry needling. She tolerated TE very well today with no c/o pain. We reviewed HEP and modified it as needed. She had very good response to DN in both lumbar and subocciptals. Pt ready for d/c.    PT Frequency  2x / week    PT Duration  6 weeks    PT Treatment/Interventions  ADLs/Self Care Home Management;Canalith Repostioning;Balance training;Therapeutic exercise;Therapeutic activities;Vestibular;Functional mobility training;Stair training;Gait training;Neuromuscular re-education;Patient/family education;Cryotherapy;Electrical Stimulation;Ultrasound;Moist Heat;Traction;Manual techniques;Dry needling;Taping    PT Next Visit Plan  D/C to HEP; Include re-cert for last visit with D/C.    PT Home Exercise Plan  Access Code: [email or web address]  *combined both medbridge packets to one set*  on 11/03/19    Consulted and Agree with Plan of Care  Patient       Patient will benefit from skilled therapeutic  intervention in order to improve the following deficits and impairments:  Decreased balance, Dizziness, Decreased strength, Postural dysfunction, Hypomobility, Impaired flexibility, Pain, Increased muscle spasms, Increased fascial restricitons  Visit Diagnosis: Acute midline low back pain without sciatica     Problem List Patient Active Problem List   Diagnosis Date Noted  . Viral syndrome 09/19/2019  . Elevated PTHrP level 03/31/2018  . Muscle spasm of  back 12/13/2017  . Subconjunctival hematoma, left 09/14/2017  . Vertigo 05/11/2017  . Hyperlipidemia 03/15/2017  . Lumbar degenerative disc disease 05/25/2016  . Varicose vein of leg 06/04/2015  . Patellofemoral pain syndrome 06/04/2015  . Overactive bladder 09/24/2014  . Myofascial pain syndrome 08/27/2014  . Family history of breast cancer 09/12/2013  . Nontoxic multinodular goiter 10/19/2008  . Goiter, nontoxic, multinodular 10/19/2008    Madelyn Flavors PT 11/29/2019, 12:18 PM  [facility] [place], [place], [address] Phone: [phone]   Fax:  [phone]  Name: Dawn [name] MRN: [id number] Date of Birth: 1981-12-05

## 2019-11-29 NOTE — Patient Instructions (Addendum)
Access Code: [email or web address]  URL: [email or web address]  Date: 11/29/2019  Prepared by: Madelyn Flavors   Exercises Wide Stance with Eyes Closed and Head Nods on Foam Pad - 10 reps - 2 sets - 2x daily - 7x weekly Wide Stance with Eyes Closed and Head Rotation on Foam Pad - 10 reps - 2 sets - 2x daily - 7x weekly Standing Gaze Stabilization with Head Rotation - 1 sets - 60 reps - 60 seconds hold - 3x daily - 7x weekly Standing Gaze Stabilization with Head Rotation and Horizontal Arm Movement - 10 reps - 2 sets - 2x daily - 7x weekly Wide Stance with Eyes Closed and Head Rotation on Foam Pad - 10 reps - 3 sets - 1x daily - 7x weekly Diona Foley Toss with Eye Tracking While Walking - 10 reps - 2 sets - 1x daily - 7x weekly Seated Cervical Retraction - 10 reps - 1 sets - 3-5 sec hold - 3x daily - 7x weekly Cat-Camel to Child's Pose - 10 reps - 3 sets - 1x daily - 7x weekly Standard Plank - 5 reps - 1 sets - 1x daily - 7x weekly Prone Alternating Arm and Leg Lifts - 10 reps - 3 sets - 5 sec hold                            - 1x daily - 7x weekly Supine 90/90 Abdominal Bracing - 10 reps - 3 sets - 1x daily - 7x weekly Standing Hip Abduction - 10 reps - 3 sets - 1x daily - 3x weekly Sidelying Open Book Thoracic Lumbar Rotation and Extension - 10 reps - 1-2 sets - 1x daily - 3x weekly Supine Lower Trunk Rotation - 2-3 reps - 1 sets - 15 hold - 1x daily                            - 3x weekly Marching Bridge - 5 reps - 3 sets - 1x daily - 3x weekly Thoracic Extension Mobilization on Foam Roll - 5 reps - 2 sets - 30 sec hold - 2x daily - 3x weekly Standing Diagonal Chops with Medicine Ball - 10 reps - 3 sets - 1x daily - 3x weekly Standard Lunge - 10 reps - 3 sets - 1x daily - 3x weekly Squat with Chair Touch - 10 reps - 3 sets - 1x daily - 3x weekly Mini Squat - 10 reps - 3 sets - 1x daily - 3x weekly Quadruped Fire Hydrant - 10 reps - 3 sets - 1x daily - 7x weekly Standing Hip Flexor Stretch - 3 reps  - 1 sets - 30 secsec hold - 2x daily - 7x weekly Wall Sit - 10 reps - 3 sets - 1x daily - 7x weekly

## 2019-12-01 ENCOUNTER — Encounter: Payer: Self-pay | Admitting: Rehabilitative and Restorative Service Providers"

## 2019-12-07 ENCOUNTER — Ambulatory Visit (INDEPENDENT_AMBULATORY_CARE_PROVIDER_SITE_OTHER): Payer: Federal, State, Local not specified - PPO | Admitting: Physical Therapy

## 2019-12-07 ENCOUNTER — Encounter: Payer: Self-pay | Admitting: Physical Therapy

## 2019-12-07 ENCOUNTER — Other Ambulatory Visit: Payer: Self-pay

## 2019-12-07 DIAGNOSIS — M545 Low back pain, unspecified: Secondary | ICD-10-CM

## 2019-12-07 NOTE — Therapy (Addendum)
[place], [place], [address] Phone: [phone]   Fax:  [phone]  Physical Therapy Treatment and Discharge Summary  Patient Details  Name: Dawn [name] MRN: [id number] Date of Birth: 1980-12-21 Referring Provider (PT): Silverio Decamp, MD   Encounter Date: 12/07/2019  PT End of Session - 12/07/19 1023    Visit Number  16    Number of Visits  16    PT Start Time  0934    PT Stop Time  1019    PT Time Calculation (min)  45 min    Activity Tolerance  Patient tolerated treatment well;No increased pain    Behavior During Therapy  WFL for tasks assessed/performed       Past Medical History:  Diagnosis Date  . AMA (advanced maternal age) multigravida 35+ 08/12/2018  . Anemia    with pregnancy  . Benign paroxysmal positional vertigo 05/11/2017  . Depression    does not currently take meds  . Goiter, nontoxic, multinodular 10/19/2008   Overview:  Nontoxic Multinodular Goiter Nml labs, [place]  . Herniated cervical disc 2016  . Hip pain   . Hyperlipidemia 03/15/2017  . Lumbar degenerative disc disease 05/25/2016  . Muscle spasm of back 12/13/2017  . Myofascial pain syndrome 08/27/2014   negative cervical spine MRI, a negative brain MRI, nerve conduction and electromyography, and normal extensive blood work for peripheral neuropathy and myopathy.   . Nontoxic multinodular goiter 10/19/2008   Overview:  Nontoxic Multinodular Goiter  . Overactive bladder 09/24/2014  . Patellofemoral pain syndrome 06/04/2015  . Right tennis elbow   . Subconjunctival hematoma, left 09/14/2017  . Supervision of other high risk pregnancy, antepartum 08/12/2018    Nursing Staff Provider Office Location  [facility] Dating  10/LMP Language  English Anatomy US   normal Flu Vaccine  08/26/18 Genetic Screen  NIPS:Negative  AFP:  Normal  First Screen:  Quad:   TDaP vaccine   12/16/18 Hgb A1C or  GTT Early  Third trimester  Rhogam   NA   LAB RESULTS  Feeding Plan  Breast Blood Type AB/RH(D) POSITIVE/-- (04/18 7619)  Contraception  NFP Antibody NO ANTIBODIES DETECTED (04/  . Thyroid goiter    benign  . Varicose vein of leg 06/04/2015    Past Surgical History:  Procedure Laterality Date  . TONSILLECTOMY  1986    There were no vitals filed for this visit.  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  Would like to consolidate the exercises further.    Pertinent History  vertigo, BPPV, herniated cervical disc 2016, lumbar DDD    Patient Stated Goals  get rid of pain    Currently in Pain?  Yes    Pain Score  1     Pain Location  Back    Pain Orientation  Right;Left;Lower    Pain Descriptors / Indicators  Aching    Aggravating Factors   bending forward    Pain Relieving Factors  stretches         [facility] PT Assessment - 12/07/19 0001      Assessment   Medical Diagnosis  vertigo/ LBP    Referring Provider (PT)  Silverio Decamp, MD    Onset Date/Surgical Date  08/15/19   approx   Hand Dominance  Right    Next MD Visit  PRN    Prior Therapy  at neuro clinic 2 years ago for vertigo; previously following initial back injury        [facility] Adult PT Treatment/Exercise - 12/07/19 0001      Lumbar Exercises: Stretches   Double Knee to Chest Stretch  1 rep;30 seconds   with rocking (after [place])   Lower Trunk Rotation  1 rep;10 seconds    Hip Flexor Stretch  Right;1 rep;10 seconds   reviewed for HEP    Other Lumbar Stretch Exercise  midlevel doorway stretch x 15 sec x 2 reps       Lumbar Exercises: Aerobic   Nustep  L5: 6 min legs only      Lumbar Exercises: Standing   Functional Squats  10 reps   buttocks to chair   Forward Lunge  10 reps   split squats, limited depth   Wall Slides  5 reps   reviewed for HEP      Lumbar Exercises: Sidelying   Other Sidelying Lumbar Exercises  Open book Rt rotation x 1 rep for HEP review       Lumbar Exercises: Prone   Opposite Arm/Leg Raise  --   verbally reviewed for HEP      Lumbar Exercises: Quadruped   [place]  --   verbally reviewed for HEP    Plank  --   verbally reviewed for HEP      Modalities   Modalities  Electrical Stimulation;Ultrasound      Acupuncturist Location  bilat lumbar paraspinals     Electrical Stimulation Action  combo [place]    Electrical Stimulation Parameters  8 min, intensity to tolerance    Electrical Stimulation Goals  Pain      Ultrasound   Ultrasound Location  see estim    Ultrasound Parameters  100%, 1.3 w/cm2 x 8 min     Ultrasound Goals  Pain                  PT Long Term Goals - 12/07/19 1247      PT LONG TERM GOAL #1   Title  independent with HEP    Baseline  pt reports she is trying to be more compliant    Status  Achieved      PT LONG TERM GOAL #2   Title  report 75% improvement in symptoms    Baseline  atleast 50% improvement in dizziness symptoms; she can drive without dizziness for the last week.  She has less HA since reducing decaf beverage intake.    Status  Partially Met      PT LONG TERM GOAL #3   Title  report symptoms </= 2/10 dizziness with functional activities for improved symptoms    Baseline  0/10 dizziness with daily activities at home and work.    Status  Achieved      PT LONG TERM GOAL #4   Title  report back pain < 3/10 with activity for improved function    Baseline  stays around a 2-3/10    Status  Achieved      PT LONG TERM GOAL #5   Title  report 50% improvement in LBP and spasms for improved function    Status  Achieved            Plan - 12/07/19 1244    Clinical Impression Statement  Pt requests review and modification of HEP.  She required some cues on form with squats/wall slides. Pt reported some irritation  of knees and low back with split squats; removed from HEP along with fire hydrants.  Pt reported reduction of pain after  combo [place] and bilat knee to chest stretch.  Pt verbalized readiness to hold therapy while she conitnues working on HEP.    PT Frequency  2x / week    PT Duration  6 weeks    PT Treatment/Interventions  ADLs/Self Care Home Management;Canalith Repostioning;Balance training;Therapeutic exercise;Therapeutic activities;Vestibular;Functional mobility training;Stair training;Gait training;Neuromuscular re-education;Patient/family education;Cryotherapy;Electrical Stimulation;Ultrasound;Moist Heat;Traction;Manual techniques;Dry needling;Taping    PT Next Visit Plan  will hold therapy until 12/22/19 per Rudell Cobb, PT note.  If pt doesn't return before then, will d/c.    PT Home Exercise Plan  Access Code: [email or web address]  *combined both medbridge packets to one set*  on 11/03/19    Consulted and Agree with Plan of Care  Patient       Patient will benefit from skilled therapeutic intervention in order to improve the following deficits and impairments:  Decreased balance, Dizziness, Decreased strength, Postural dysfunction, Hypomobility, Impaired flexibility, Pain, Increased muscle spasms, Increased fascial restricitons  Visit Diagnosis: Acute midline low back pain without sciatica     Problem List Patient Active Problem List   Diagnosis Date Noted  . Viral syndrome 09/19/2019  . Elevated PTHrP level 03/31/2018  . Muscle spasm of back 12/13/2017  . Subconjunctival hematoma, left 09/14/2017  . Vertigo 05/11/2017  . Hyperlipidemia 03/15/2017  . Lumbar degenerative disc disease 05/25/2016  . Varicose vein of leg 06/04/2015  . Patellofemoral pain syndrome 06/04/2015  . Overactive bladder 09/24/2014  . Myofascial pain syndrome 08/27/2014  . Family history of breast cancer 09/12/2013  . Nontoxic multinodular goiter 10/19/2008  . Goiter, nontoxic, multinodular 10/19/2008    PHYSICAL THERAPY DISCHARGE SUMMARY  Visits from Start of Care: 16  Current functional level related to goals / functional  outcomes: See goals above for low back.  Patient was also on hold for vestibular therapy working on progressing her HEP.     Remaining deficits: Last vestibular visit 12/9 with patient holding to determine if further follow-up indicated. No return visit scheduled.   Education / Equipment: Home program for lumbar stabilization and vestibular rehab provided.  Plan: Patient agrees to discharge.  Patient goals were partially met. Patient is being discharged due to meeting the stated rehab goals.  ?????         Thank you for the referral of this patient. Rudell Cobb, MPT  Kerin Perna, [place] 12/07/19 12:47 PM  [facility] [place], [place], [address] Phone: [phone]   Fax:  [phone]  Name: Dawn [name] MRN: [id number] Date of Birth: 12/28/1980

## 2019-12-25 ENCOUNTER — Ambulatory Visit: Payer: Federal, State, Local not specified - PPO | Admitting: Neurology

## 2019-12-25 ENCOUNTER — Other Ambulatory Visit: Payer: Self-pay

## 2019-12-25 ENCOUNTER — Encounter: Payer: Self-pay | Admitting: Neurology

## 2019-12-25 VITALS — BP 114/71 | HR 85 | Temp 97.9°F | Ht 65.0 in | Wt 155.6 lb

## 2019-12-25 DIAGNOSIS — G43109 Migraine with aura, not intractable, without status migrainosus: Secondary | ICD-10-CM | POA: Diagnosis not present

## 2019-12-25 DIAGNOSIS — G43809 Other migraine, not intractable, without status migrainosus: Secondary | ICD-10-CM | POA: Diagnosis not present

## 2019-12-25 DIAGNOSIS — R52 Pain, unspecified: Secondary | ICD-10-CM | POA: Diagnosis not present

## 2019-12-25 DIAGNOSIS — G43709 Chronic migraine without aura, not intractable, without status migrainosus: Secondary | ICD-10-CM

## 2019-12-25 MED ORDER — AJOVY 225 MG/1.5ML ~~LOC~~ SOAJ: 225.0000 mg | SUBCUTANEOUS | 11 refills | Status: DC

## 2019-12-25 MED ORDER — ONDANSETRON 4 MG PO TBDP: 4.0000 mg | ORAL_TABLET | Freq: Three times a day (TID) | ORAL | 3 refills | Status: DC | PRN

## 2019-12-25 MED ORDER — AJOVY 225 MG/1.5ML ~~LOC~~ SOAJ: 225.0000 mg | SUBCUTANEOUS | 0 refills | Status: DC

## 2019-12-25 NOTE — Patient Instructions (Addendum)
Monthly Ajovy Ondansetron for nausea, headache, dizziness   "There is increased risk for stroke in women with migraine with aura and a contraindication for the combined contraceptive pill for use by women who have migraine with aura. The risk for women with migraine without aura is lower. However other risk factors like smoking are far more likely to increase stroke risk than migraine. There is a recommendation for no smoking and for the use of OCPs without estrogen such as progestogen only pills particularly for women with migraine with aura.Marland Kitchen People who have migraine headaches with auras may be 3 times more likely to have a stroke caused by a blood clot, compared to migraine patients who don't see auras. Women who take hormone-replacement therapy may be 30 percent more likely to suffer a clot-based stroke than women not taking medication containing estrogen. Other risk factors like smoking and high blood pressure may be  much more important."  Ondansetron oral dissolving tablet What is this medicine? ONDANSETRON (on DAN se tron) is used to treat nausea and vomiting caused by chemotherapy. It is also used to prevent or treat nausea and vomiting after surgery. This medicine may be used for other purposes; ask your health care provider or pharmacist if you have questions. COMMON BRAND NAME(S): Zofran ODT What should I tell my health care provider before I take this medicine? They need to know if you have any of these conditions:  heart disease  history of irregular heartbeat  liver disease  low levels of magnesium or potassium in the blood  an unusual or allergic reaction to ondansetron, granisetron, other medicines, foods, dyes, or preservatives  pregnant or trying to get pregnant  breast-feeding How should I use this medicine? These tablets are made to dissolve in the mouth. Do not try to push the tablet through the foil backing. With dry hands, peel away the foil backing and gently  remove the tablet. Place the tablet in the mouth and allow it to dissolve, then swallow. While you may take these tablets with water, it is not necessary to do so. Talk to your pediatrician regarding the use of this medicine in children. Special care may be needed. Overdosage: If you think you have taken too much of this medicine contact a poison control center or emergency room at once. NOTE: This medicine is only for you. Do not share this medicine with others. What if I miss a dose? If you miss a dose, take it as soon as you can. If it is almost time for your next dose, take only that dose. Do not take double or extra doses. What may interact with this medicine? Do not take this medicine with any of the following medications:  apomorphine  certain medicines for fungal infections like fluconazole, itraconazole, ketoconazole, posaconazole, voriconazole  cisapride  dronedarone  pimozide  thioridazine This medicine may also interact with the following medications:  carbamazepine  certain medicines for depression, anxiety, or psychotic disturbances  fentanyl  linezolid  MAOIs like Carbex, Eldepryl, Marplan, Nardil, and Parnate  methylene blue (injected into a vein)  other medicines that prolong the QT interval (cause an abnormal heart rhythm) like dofetilide, ziprasidone  phenytoin  rifampicin  tramadol This list may not describe all possible interactions. Give your health care provider a list of all the medicines, herbs, non-prescription drugs, or dietary supplements you use. Also tell them if you smoke, drink alcohol, or use illegal drugs. Some items may interact with your medicine. What should I watch for  while using this medicine? Check with your doctor or health care professional as soon as you can if you have any sign of an allergic reaction. What side effects may I notice from receiving this medicine? Side effects that you should report to your doctor or health care  professional as soon as possible:  allergic reactions like skin rash, itching or hives, swelling of the face, lips, or tongue  breathing problems  confusion  dizziness  fast or irregular heartbeat  feeling faint or lightheaded, falls  fever and chills  loss of balance or coordination  seizures  sweating  swelling of the hands and feet  tightness in the chest  tremors  unusually weak or tired Side effects that usually do not require medical attention (report to your doctor or health care professional if they continue or are bothersome):  constipation or diarrhea  headache This list may not describe all possible side effects. Call your doctor for medical advice about side effects. You may report side effects to FDA at 1-800-FDA-1088. Where should I keep my medicine? Keep out of the reach of children. Store between 2 and 30 degrees C (36 and 86 degrees F). Throw away any unused medicine after the expiration date. NOTE: This sheet is a summary. It may not cover all possible information. If you have questions about this medicine, talk to your doctor, pharmacist, or health care provider.  2020 Elsevier/Gold Standard (2018-11-22 07:14:10) Vernell Barrier injection What is this medicine? FREMANEZUMAB (fre ma NEZ ue mab) is used to prevent migraine headaches. This medicine may be used for other purposes; ask your health care provider or pharmacist if you have questions. COMMON BRAND NAME(S): AJOVY What should I tell my health care provider before I take this medicine? They need to know if you have any of these conditions:  an unusual or allergic reaction to fremanezumab, other medicines, foods, dyes, or preservatives  pregnant or trying to get pregnant  breast-feeding How should I use this medicine? This medicine is for injection under the skin. You will be taught how to prepare and give this medicine. Use exactly as directed. Take your medicine at regular intervals. Do not  take your medicine more often than directed. It is important that you put your used needles and syringes in a special sharps container. Do not put them in a trash can. If you do not have a sharps container, call your pharmacist or healthcare provider to get one. Talk to your pediatrician regarding the use of this medicine in children. Special care may be needed. Overdosage: If you think you have taken too much of this medicine contact a poison control center or emergency room at once. NOTE: This medicine is only for you. Do not share this medicine with others. What if I miss a dose? If you miss a dose, take it as soon as you can. If it is almost time for your next dose, take only that dose. Do not take double or extra doses. What may interact with this medicine? Interactions are not expected. This list may not describe all possible interactions. Give your health care provider a list of all the medicines, herbs, non-prescription drugs, or dietary supplements you use. Also tell them if you smoke, drink alcohol, or use illegal drugs. Some items may interact with your medicine. What should I watch for while using this medicine? Tell your doctor or healthcare professional if your symptoms do not start to get better or if they get worse. What side effects may  I notice from receiving this medicine? Side effects that you should report to your doctor or health care professional as soon as possible:  allergic reactions like skin rash, itching or hives, swelling of the face, lips, or tongue Side effects that usually do not require medical attention (report these to your doctor or health care professional if they continue or are bothersome):  pain, redness, or irritation at site where injected This list may not describe all possible side effects. Call your doctor for medical advice about side effects. You may report side effects to FDA at 1-800-FDA-1088. Where should I keep my medicine? Keep out of the  reach of children. You will be instructed on how to store this medicine. Throw away any unused medicine after the expiration date on the label. NOTE: This sheet is a summary. It may not cover all possible information. If you have questions about this medicine, talk to your doctor, pharmacist, or health care provider.  2020 Elsevier/Gold Standard (2017-08-30 17:22:56)

## 2019-12-25 NOTE — Addendum Note (Signed)
Addended by: Naomie Dean B on: 12/25/2019 07:55 PM   Modules accepted: Orders

## 2019-12-25 NOTE — Progress Notes (Signed)
[facility]    Provider:  Dr Lucia Gaskins Requesting Provider: Monica Becton,* Primary Care Provider:  Monica Becton, MD  CC:  vertigo  HPI:  Dawn [name] is a 39 y.o. female here as requested by Monica Becton,* for vertigo.  I reviewed referring physician's notes.  Past medical history anemia with pregnancy, benign paroxysmal positional vertigo, depression, herniated cervical disc, hyperlipidemia, lumbar degenerative disc disease, myofascial pain syndrome with a negative cervical spine MRI, negative brain MRI, nerve conduction and EMG and normal extensive blood work-up for peripheral neuropathy and myopathy, nontoxic multinodular goiter.  Patient has been breast-feeding, at appointment in November she complained of pain and pressure as well as nasal discharge, pressure behind the maxillary sinus with radiation to the teeth, mild cough, fatigue, low-grade fevers, myalgias.  Husband and daughter were having similar symptoms.  I have seen patient in the past but not for over 4 years.  In the past she had extensive imaging of the brain and the cervical spine, extensive lab work myopathy and paresthesias, and arthralgias, also EMG nerve conduction study at that time.  She was on Cymbalta and Lyrica when I saw her.  She had tried Flexeril in the past.  Extensive work-up in the past unremarkable.  She has been going to physical therapy, I reviewed notes, vestibular rehabilitation and dizziness, herniated cervical disc and vertigo and she has been working with physical therapy for this.  She last went to therapy December 07, 2019.  She also saw Brynda Peon in otolaryngology, for spells of dizziness and sometimes vertigo, no nausea or vomiting, feels her hearing is globally depressed but nonspecific to 1 year, no positional component, no change in tinnitus, she is to have headaches associated with it less so now, she has been diagnosed with vestibular migraines  in the past.  I reviewed his notes, normal head and neck pain, no ear pathology, symptoms consistent with vestibular migraine.  She gave birth March 22nd this year.She had a boy, she is working from home and baby is in daycare. They also have a daughter, 71 years old. She is not breastfeeding. She breast fed for 6 months. 2-3 years ago she went for PT and the therapist mentioned vestibular migraine. She gets the dizziness with the headache and without the headache. She felt fine when she was pregnant. She has been to vestibular PT in the past and now. She has tried to manage foods, has been under the care of her pcp and doing therapy. She has watched her food, drinking more fluids, it happens the most when she is driving, not positional, not orthostatic, she feels the cars screaming by and motion makes her feel dizzy. The constant peripheral stimulus makes her tired, feels dizzy, she drives at night and the lights bother her, her visual fields close in on her and she feels like she loses peripheral vision.Her myalgias and arthralgias (which she was extensively evaluated for in the past) went away. The headaches have been ongoing almost 3 years, no known family history of migraines, started with having her child, she has pain occipital pain, dry needling helped, headache is occipital, spreads to the ear and she has ear pain, pulsating and pounding and throbbing, her neck feels numb, her hands feel a lack of sensation. +light sensitivity, sleeping in a dark room helped, movement makes them worse, last 4-72 hours, she can wake with the headache and can be worse positionally and exertionally. She gets dizziness and sometimes frank vertigo  with and without the headache. She has seen spots before headache. Also trouble swallowing. She is not having a lot of headaches and they are mild. Dizziness happens with driving mostly.  Reviewed notes, labs and imaging from outside physicians, which showed:  MRI brain 10/24/2019  showed No acute intracranial abnormalities including mass lesion or mass effect, hydrocephalus, extra-axial fluid collection, midline shift, hemorrhage, IAC abnormality, or acute infarction, large ischemic events (personally reviewed images)   Review of Systems: Patient complains of symptoms per HPI as well as the following symptoms: headaches. Pertinent negatives and positives per HPI. All others negative.   Social History   Socioeconomic History  . Marital status: Married    Spouse name: Cristie Hem  . Number of children: 1  . Years of education: 12+  . Highest education level: Not on file  Occupational History  . 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 Smoking status: Never Smoker  . Smokeless tobacco: Never Used  Substance and Sexual Activity  . Alcohol use: Not Currently    Alcohol/week: 0.0 standard drinks    Comment: socially  . Drug use: No  . Sexual activity: Yes    Birth control/protection: None  Other Topics Concern  . Not on file  Social History Narrative   Patient lives at home with husband and daughter.    Patient is right handed   Patient has one child   Patient has a college degree.    Caffeine: tea 1 cup   Social Determinants of Health   Financial Resource Strain:   . Difficulty of Paying Living Expenses: Not on file  Food Insecurity:   . Worried About Charity fundraiser in the Last Year: Not on file  . Ran Out of Food in the Last Year: Not on file  Transportation Needs:   . Lack of Transportation (Medical): Not on file  . Lack of Transportation (Non-Medical): Not on file  Physical Activity:   . Days of Exercise per Week: Not on file  . Minutes of Exercise per Session: Not on file  Stress:   . Feeling of Stress : Not on file  Social Connections:   . Frequency of Communication with Friends and Family: Not on file  . Frequency of Social Gatherings with Friends and Family: Not on file  . Attends Religious  Services: Not on file  . Active Member of Clubs or Organizations: Not on file  . Attends Archivist Meetings: Not on file  . Marital Status: Not on file  Intimate Partner Violence:   . Fear of Current or Ex-Partner: Not on file  . Emotionally Abused: Not on file  . Physically Abused: Not on file  . Sexually Abused: Not on file    Family History  Problem Relation Age of Onset  . Hyperlipidemia Father   . Birth defects Maternal Grandmother   . Cancer Maternal Grandmother        Ovarian  . Birth defects Maternal Grandfather   . Cancer Maternal Grandfather   . Breast cancer Maternal Aunt 42  . Breast cancer Maternal Aunt 29  . Hypertension Sister   . Breast cancer Sister     Past Medical History:  Diagnosis Date  . AMA (advanced maternal age) multigravida 35+ 08/12/2018  . Anemia    with pregnancy  . Benign paroxysmal positional vertigo 05/11/2017  . Depression    does not currently take meds  . Goiter, nontoxic, multinodular 10/19/2008  Overview:  Nontoxic Multinodular Goiter Nml labs, [place]  . Herniated cervical disc 2016  . Hip pain   . Hyperlipidemia 03/15/2017  . Lumbar degenerative disc disease 05/25/2016  . Muscle spasm of back 12/13/2017  . Myofascial pain syndrome 08/27/2014   negative cervical spine MRI, a negative brain MRI, nerve conduction and electromyography, and normal extensive blood work for peripheral neuropathy and myopathy.   . Nontoxic multinodular goiter 10/19/2008   Overview:  Nontoxic Multinodular Goiter  . Overactive bladder 09/24/2014  . Patellofemoral pain syndrome 06/04/2015  . Right tennis elbow   . Subconjunctival hematoma, left 09/14/2017  . Supervision of other high risk pregnancy, antepartum 08/12/2018    Nursing Staff Provider Office Location  [facility] Dating  10/LMP Language  English Anatomy US   normal Flu Vaccine  08/26/18 Genetic Screen  NIPS:Negative  AFP:  Normal  First Screen:  Quad:   TDaP vaccine   12/16/18 Hgb A1C or  GTT Early  Third  trimester  Rhogam  NA   LAB RESULTS  Feeding Plan  Breast Blood Type AB/RH(D) POSITIVE/-- (04/18 1884)  Contraception  NFP Antibody NO ANTIBODIES DETECTED (04/  . Thyroid goiter    benign  . Varicose vein of leg 06/04/2015    Patient Active Problem List   Diagnosis Date Noted  . Vestibular migraine 12/25/2019  . Chronic migraine without aura without status migrainosus, not intractable 12/25/2019  . Migraine aura without headache 12/25/2019  . Migraine with aura and without status migrainosus, not intractable 12/25/2019  . Viral syndrome 09/19/2019  . Elevated PTHrP level 03/31/2018  . Muscle spasm of back 12/13/2017  . Subconjunctival hematoma, left 09/14/2017  . Vertigo 05/11/2017  . Hyperlipidemia 03/15/2017  . Lumbar degenerative disc disease 05/25/2016  . Varicose vein of leg 06/04/2015  . Patellofemoral pain syndrome 06/04/2015  . Overactive bladder 09/24/2014  . Myofascial pain syndrome 08/27/2014  . Family history of breast cancer 09/12/2013  . Nontoxic multinodular goiter 10/19/2008  . Goiter, nontoxic, multinodular 10/19/2008    Past Surgical History:  Procedure Laterality Date  . TONSILLECTOMY  1986    Current Outpatient Medications  Medication Sig Dispense Refill  . Ferrous Sulfate (IRON) 325 (65 Fe) MG TABS TAKE 1 TABLET BY MOUTH TWICE DAILY WITH MEALS 60 tablet 0  . meclizine (ANTIVERT) 25 MG tablet TAKE 1 TABLET BY MOUTH THREE TIMES DAILY AS NEEDED FOR DIZZINESS OR NAUSEA. 30 tablet 0  . Fremanezumab-vfrm (AJOVY) 225 MG/1.5ML SOAJ Inject 225 mg into the skin every 30 (thirty) days. 1 pen 11  . ondansetron (ZOFRAN-ODT) 4 MG disintegrating tablet Take 1-2 tablets (4-8 mg total) by mouth every 8 (eight) hours as needed for nausea. Or dizziness or vertigo or prior to episodes that may incite these. 30 tablet 3   No current facility-administered medications for this visit.    Allergies as of 12/25/2019 - Review Complete 12/25/2019  Allergen Reaction Noted  . Latex  Other (See Comments) 01/22/2011    Vitals: BP 114/71   Pulse 85   Temp 97.9 F (36.6 C)   Ht 5\' 5"  (1.651 m)   Wt 155 lb 9.6 oz (70.6 kg)   BMI 25.89 kg/m  Last Weight:  Wt Readings from Last 1 Encounters:  12/25/19 155 lb 9.6 oz (70.6 kg)   Last Height:   Ht Readings from Last 1 Encounters:  12/25/19 5\' 5"  (1.651 m)     Physical exam: Exam: Gen: NAD, conversant, well nourised, well groomed  CV: RRR, no MRG. No Carotid Bruits. No peripheral edema, warm, nontender Eyes: Conjunctivae clear without exudates or hemorrhage  Neuro: Detailed Neurologic Exam  Speech:    Speech is normal; fluent and spontaneous with normal comprehension.  Cognition:    The patient is oriented to person, place, and time;     recent and remote memory intact;     language fluent;     normal attention, concentration,     fund of knowledge Cranial Nerves:    The pupils are equal, round, and reactive to light. The fundi are normal and spontaneous venous pulsations are present. Visual fields are full to finger confrontation. Extraocular movements are intact. Trigeminal sensation is intact and the muscles of mastication are normal. The face is symmetric. The palate elevates in the midline. Hearing intact. Voice is normal. Shoulder shrug is normal. The tongue has normal motion without fasciculations.   Coordination:    Normal finger to nose and heel to shin. Normal rapid alternating movements.   Gait:    Heel-toe and tandem gait are normal.   Motor Observation:    No asymmetry, no atrophy, and no involuntary movements noted. Tone:    Normal muscle tone.    Posture:    Posture is normal. normal erect    Strength:    Strength is V/V in the upper and lower limbs.      Sensation: intact to LT     Reflex Exam:  DTR's:    Deep tendon reflexes in the upper and lower extremities are normal bilaterally.   Toes:    The toes are downgoing bilaterally.   Clonus:    Clonus is  absent.    Assessment/Plan:  She is not having a lot of headaches and they are mild. Dizziness happens with driving mostly. May be migraine with vestibular symptoms, MRI brain negative, will treat for migraines and follow clinically  We discussed at length: Will try preventative as well as a an acute management medication.She forgets to take her meds. So will give her the monthly injections. Tried meclizine, celexa, flexeril, cymbalta, lyrica, zoloft  Acute: Zofran, it is not as sedating so hopefully can use it for driving or symptomatically. Meclizine did not help, scopolamine neither.   Orders Placed This Encounter  Procedures  . CBC  . CMP  . TSH   Meds ordered this encounter  Medications  . ondansetron (ZOFRAN-ODT) 4 MG disintegrating tablet    Sig: Take 1-2 tablets (4-8 mg total) by mouth every 8 (eight) hours as needed for nausea. Or dizziness or vertigo or prior to episodes that may incite these.    Dispense:  30 tablet    Refill:  3  . Fremanezumab-vfrm (AJOVY) 225 MG/1.5ML SOAJ    Sig: Inject 225 mg into the skin every 30 (thirty) days.    Dispense:  1 pen    Refill:  11    Patient has copay card; she can have medication for $5 regardless of insurance approval or copay amount.    "There is increased risk for stroke in women with migraine with aura and a contraindication for the combined contraceptive pill for use by women who have migraine with aura. The risk for women with migraine without aura is lower. However other risk factors like smoking are far more likely to increase stroke risk than migraine. There is a recommendation for no smoking and for the use of OCPs without estrogen such as progestogen only pills particularly for women with migraine with aura.Marland Kitchen  People who have migraine headaches with auras may be 3 times more likely to have a stroke caused by a blood clot, compared to migraine patients who don't see auras. Women who take hormone-replacement therapy may be 30  percent more likely to suffer a clot-based stroke than women not taking medication containing estrogen. Other risk factors like smoking and high blood pressure may be  much more import  Discussed: To prevent or relieve headaches, try the following: Cool Compress. Lie down and place a cool compress on your head.  Avoid headache triggers. If certain foods or odors seem to have triggered your migraines in the past, avoid them. A headache diary might help you identify triggers.  Include physical activity in your daily routine. Try a daily walk or other moderate aerobic exercise.  Manage stress. Find healthy ways to cope with the stressors, such as delegating tasks on your to-do list.  Practice relaxation techniques. Try deep breathing, yoga, massage and visualization.  Eat regularly. Eating regularly scheduled meals and maintaining a healthy diet might help prevent headaches. Also, drink plenty of fluids.  Follow a regular sleep schedule. Sleep deprivation might contribute to headaches Consider biofeedback. With this mind-body technique, you learn to control certain bodily functions -- such as muscle tension, heart rate and blood pressure -- to prevent headaches or reduce headache pain.    Proceed to emergency room if you experience new or worsening symptoms or symptoms do not resolve, if you have new neurologic symptoms or if headache is severe, or for any concerning symptom.   Provided education and documentation from American headache Society toolbox including articles on: chronic migraine medication overuse headache, chronic migraines, prevention of migraines, behavioral and other nonpharmacologic treatments for headache.   Cc: Rubie Maid, MD  [facility] [address] [place], [place] [address]  Phone [phone] Fax [phone]

## 2019-12-26 LAB — CBC
Hematocrit: 43.4 % (ref 34.0–46.6)
Hemoglobin: 14.4 g/dL (ref 11.1–15.9)
MCH: 28.6 pg (ref 26.6–33.0)
MCHC: 33.2 g/dL (ref 31.5–35.7)
MCV: 86 fL (ref 79–97)
Platelets: 225 10*3/uL (ref 150–450)
RBC: 5.03 x10E6/uL (ref 3.77–5.28)
RDW: 13 % (ref 11.7–15.4)
WBC: 6.8 10*3/uL (ref 3.4–10.8)

## 2019-12-26 LAB — COMPREHENSIVE METABOLIC PANEL
ALT: 6 IU/L (ref 0–32)
AST: 17 IU/L (ref 0–40)
Albumin/Globulin Ratio: 2 (ref 1.2–2.2)
Albumin: 4.6 g/dL (ref 3.8–4.8)
Alkaline Phosphatase: 63 IU/L (ref 39–117)
BUN/Creatinine Ratio: 19 (ref 9–23)
BUN: 11 mg/dL (ref 6–24)
Bilirubin Total: 0.3 mg/dL (ref 0.0–1.2)
CO2: 25 mmol/L (ref 20–29)
Calcium: 9.3 mg/dL (ref 8.7–10.2)
Chloride: 100 mmol/L (ref 96–106)
Creatinine, Ser: 0.59 mg/dL (ref 0.57–1.00)
GFR calc Af Amer: 133 mL/min/{1.73_m2} (ref >59)
GFR calc non Af Amer: 115 mL/min/{1.73_m2} (ref >59)
Globulin, Total: 2.3 g/dL (ref 1.5–4.5)
Glucose: 91 mg/dL (ref 65–99)
Potassium: 4.1 mmol/L (ref 3.5–5.2)
Sodium: 141 mmol/L (ref 134–144)
Total Protein: 6.9 g/dL (ref 6.0–8.5)

## 2019-12-26 LAB — TSH: TSH: 1.72 u[IU]/mL (ref 0.450–4.500)

## 2020-03-18 ENCOUNTER — Ambulatory Visit
Admission: RE | Admit: 2020-03-18 | Discharge: 2020-03-18 | Disposition: A | Discharge status: Home / Self Care | Payer: Federal, State, Local not specified - PPO | Source: Ambulatory Visit | Attending: Obstetrics & Gynecology | Admitting: Obstetrics & Gynecology

## 2020-03-18 ENCOUNTER — Other Ambulatory Visit: Payer: Self-pay

## 2020-03-18 DIAGNOSIS — Z1231 Encounter for screening mammogram for malignant neoplasm of breast: Secondary | ICD-10-CM | POA: Diagnosis not present

## 2020-04-23 ENCOUNTER — Telehealth: Payer: Self-pay | Admitting: Neurology

## 2020-04-23 NOTE — Telephone Encounter (Signed)
Can change appt to Mychart. TY!

## 2020-04-23 NOTE — Telephone Encounter (Signed)
Pt gave consent for insurance to be filed for vv  Pt understands that although there may be some limitations with this type of visit, we will take all precautions to reduce any security or privacy concerns.  Pt understands that this will be treated like an in office visit and we will file with pt's insurance, and there may be a patient responsible charge related to this service.  

## 2020-04-23 NOTE — Telephone Encounter (Signed)
Phone rep checked office voicemail's,at 8:37 pt left message that her daughter may have been exposed to Covid-19 thur school.  Pt is open to doing a my chart VV and also willing to r/ s if need be. Pt states she is doing well on medication if that has any bearing re: what Amy,NP decides.  Please call

## 2020-04-24 ENCOUNTER — Encounter: Payer: Self-pay | Admitting: Family Medicine

## 2020-04-24 ENCOUNTER — Telehealth (INDEPENDENT_AMBULATORY_CARE_PROVIDER_SITE_OTHER): Payer: Federal, State, Local not specified - PPO | Admitting: Family Medicine

## 2020-04-24 DIAGNOSIS — G43809 Other migraine, not intractable, without status migrainosus: Secondary | ICD-10-CM | POA: Diagnosis not present

## 2020-04-24 DIAGNOSIS — G43109 Migraine with aura, not intractable, without status migrainosus: Secondary | ICD-10-CM

## 2020-04-24 NOTE — Progress Notes (Addendum)
PATIENT: Dawn [name] DOB: 06/19/1981  REASON FOR VISIT: follow up HISTORY FROM: patient  Virtual Visit via Telephone Note  I connected with Dawn [name] on 04/24/20 at  9:30 AM EDT by telephone and verified that I am speaking with the correct person using two identifiers.   I discussed the limitations, risks, security and privacy concerns of performing an evaluation and management service by telephone and the availability of in person appointments. I also discussed with the patient that there may be a patient responsible charge related to this service. The patient expressed understanding and agreed to proceed.   History of Present Illness:  04/24/20 Dawn [name] is a 39 y.o. female here today for follow up. She was started on Ajovy for concerns of vestibular migraines.  She reports that symptoms have improved since continuing Ajovy monthly.  She is uncertain if Zofran has helped but she has tolerated this medication when needed.  She has intermittent dizziness but feels that it has significantly improved.  Meclizine and physical therapy have not been helpful in the past.   History (copied from Dr Cathren Laine note on 12/25/2019)  HPI:  Magnolia Mattila is a 39 y.o. female here as requested by Silverio Decamp,* for vertigo.  I reviewed referring physician's notes.  Past medical history anemia with pregnancy, benign paroxysmal positional vertigo, depression, herniated cervical disc, hyperlipidemia, lumbar degenerative disc disease, myofascial pain syndrome with a negative cervical spine MRI, negative brain MRI, nerve conduction and EMG and normal extensive blood work-up for peripheral neuropathy and myopathy, nontoxic multinodular goiter.  Patient has been breast-feeding, at appointment in November she complained of pain and pressure as well as nasal discharge, pressure behind the maxillary sinus with radiation to the teeth, mild cough, fatigue, low-grade  fevers, myalgias.  Husband and daughter were having similar symptoms.  I have seen patient in the past but not for over 4 years.  In the past she had extensive imaging of the brain and the cervical spine, extensive lab work myopathy and paresthesias, and arthralgias, also EMG nerve conduction study at that time.  She was on Cymbalta and Lyrica when I saw her.  She had tried Flexeril in the past.  Extensive work-up in the past unremarkable.  She has been going to physical therapy, I reviewed notes, vestibular rehabilitation and dizziness, herniated cervical disc and vertigo and she has been working with physical therapy for this.  She last went to therapy December 07, 2019.  She also saw [facility] in otolaryngology, for spells of dizziness and sometimes vertigo, no nausea or vomiting, feels her hearing is globally depressed but nonspecific to 1 year, no positional component, no change in tinnitus, she is to have headaches associated with it less so now, she has been diagnosed with vestibular migraines in the past.  I reviewed his notes, normal head and neck pain, no ear pathology, symptoms consistent with vestibular migraine.  She gave birth March 22nd this year.She had a boy, she is working from home and baby is in daycare. They also have a daughter, 36 years old. She is not breastfeeding. She breast fed for 6 months. 2-3 years ago she went for PT and the therapist mentioned vestibular migraine. She gets the dizziness with the headache and without the headache. She felt fine when she was pregnant. She has been to vestibular PT in the past and now. She has tried to manage foods, has been under the care of her pcp and doing therapy.  She has watched her food, drinking more fluids, it happens the most when she is driving, not positional, not orthostatic, she feels the cars screaming by and motion makes her feel dizzy. The constant peripheral stimulus makes her tired, feels dizzy, she drives at night and the lights  bother her, her visual fields close in on her and she feels like she loses peripheral vision.Her myalgias and arthralgias (which she was extensively evaluated for in the past) went away. The headaches have been ongoing almost 3 years, no known family history of migraines, started with having her child, she has pain occipital pain, dry needling helped, headache is occipital, spreads to the ear and she has ear pain, pulsating and pounding and throbbing, her neck feels numb, her hands feel a lack of sensation. +light sensitivity, sleeping in a dark room helped, movement makes them worse, last 4-72 hours, she can wake with the headache and can be worse positionally and exertionally. She gets dizziness and sometimes frank vertigo with and without the headache. She has seen spots before headache. Also trouble swallowing. She is not having a lot of headaches and they are mild. Dizziness happens with driving mostly.  Reviewed notes, labs and imaging from outside physicians, which showed:  MRI brain 10/24/2019 showed No acute intracranial abnormalities including mass lesion or mass effect, hydrocephalus, extra-axial fluid collection, midline shift, hemorrhage, IAC abnormality, or acute infarction, large ischemic events (personally reviewed images)   Observations/Objective:  Generalized: Well developed, in no acute distress  Mentation: Alert oriented to time, place, history taking. Follows all commands speech and language fluent   Assessment and Plan:  39 y.o. year old female  has a past medical history of AMA (advanced maternal age) multigravida 35+ (08/12/2018), Anemia, Benign paroxysmal positional vertigo (05/11/2017), Depression, Goiter, nontoxic, multinodular (10/19/2008), Herniated cervical disc (2016), Hip pain, Hyperlipidemia (03/15/2017), Lumbar degenerative disc disease (05/25/2016), Muscle spasm of back (12/13/2017), Myofascial pain syndrome (08/27/2014), Nontoxic multinodular goiter (10/19/2008),  Overactive bladder (09/24/2014), Patellofemoral pain syndrome (06/04/2015), Right tennis elbow, Subconjunctival hematoma, left (09/14/2017), Supervision of other high risk pregnancy, antepartum (08/12/2018), Thyroid goiter, and Varicose vein of leg (06/04/2015). here with    ICD-10-CM   1. Vestibular migraine  G43.809   2. Migraine with aura and without status migrainosus, not intractable  G43.109    Asuna reports that symptoms have significantly improved since starting Ajovy every month.  She is tolerating the medication well without obvious adverse effects.  We will continue current treatment plan.  She may continue Zofran as needed for nausea.  We have discussed external factors contributing to migraines and dizziness.  Encouraged to stay well-hydrated.  She was encouraged to avoid skipping meals and work on a well-balanced diet.  She will monitor closely for any concerns of allergy symptoms.  She will follow-up closely with primary care for any concerns of anxiety or depression.  She will follow-up with [place] in 1 year, sooner if needed.  She verbalizes understanding and agreement with this plan.   No orders of the defined types were placed in this encounter.   No orders of the defined types were placed in this encounter.    Follow Up Instructions:  I discussed the assessment and treatment plan with the patient. The patient was provided an opportunity to ask questions and all were answered. The patient agreed with the plan and demonstrated an understanding of the instructions.   The patient was advised to call back or seek an in-person evaluation if the symptoms worsen or if  the condition fails to improve as anticipated.  I provided 15 minutes of non-face-to-face time during this encounter.  Patient located at her place of residence during my chart visit.  Provider is in the office.   Shawnie Dapper, NP   Made any corrections needed, and agree with history, physical, neuro exam,assessment and  plan as stated.     Naomie Dean, MD [facility]

## 2020-06-19 ENCOUNTER — Encounter: Payer: Self-pay | Admitting: Family Medicine

## 2020-06-19 ENCOUNTER — Telehealth (INDEPENDENT_AMBULATORY_CARE_PROVIDER_SITE_OTHER): Payer: Federal, State, Local not specified - PPO | Admitting: Family Medicine

## 2020-06-19 DIAGNOSIS — J209 Acute bronchitis, unspecified: Secondary | ICD-10-CM | POA: Diagnosis not present

## 2020-06-19 MED ORDER — HYDROCODONE-HOMATROPINE 5-1.5 MG/5ML PO SYRP: 5.0000 mL | ORAL_SOLUTION | Freq: Every evening | ORAL | 0 refills | Status: DC | PRN

## 2020-06-19 MED ORDER — DOXYCYCLINE HYCLATE 100 MG PO TABS: 100.0000 mg | ORAL_TABLET | Freq: Two times a day (BID) | ORAL | 0 refills | Status: DC

## 2020-06-19 NOTE — Assessment & Plan Note (Addendum)
Worsening symptoms over the past week. Will add course of doxycycline. Hycodan at bedtime as needed for cough.  Continue supportive care with increased fluids and rest.  Contact clinic for follow up if not improving or symptoms continue to worsen.

## 2020-06-19 NOTE — Progress Notes (Signed)
Allergy sxs x 1 week. Now wet cough starting Saturday.

## 2020-06-19 NOTE — Progress Notes (Signed)
Dawn [name] - 39 y.o. female MRN [id number]  Date of birth: 20-Jul-1981   This visit type was conducted due to national recommendations for restrictions regarding the COVID-19 Pandemic (e.g. social distancing).  This format is felt to be most appropriate for this patient at this time.  All issues noted in this document were discussed and addressed.  No physical exam was performed (except for noted visual exam findings with Video Visits).  I discussed the limitations of evaluation and management by telemedicine and the availability of in person appointments. The patient expressed understanding and agreed to proceed.  I connected with@ on 06/19/20 at  8:10 AM EDT by a video enabled telemedicine application and verified that I am speaking with the correct person using two identifiers.  Present at visit: Everrett Coombe, DO Dwain Sarna   Patient Location: Home [address] [place] [address]   Provider location:   [facility]  Chief Complaint  Patient presents with  . Nasal Congestion    HPI  Dawn [name] is a 39 y.o. female who presents via audio/video conferencing for a telehealth visit today.  She has complaint of congestion with cough and body aches.  She reports that symptoms started a little over a week ago.  She was camping and thought this was due to allergies at first, but symptoms worsened throughout the week.   She is now having increased cough with yellow phlegm and blood mixed in with mucus of her nose.  She has had some occasional wheezing.  She denies fever, nausea or vomiting or rash.  She is fully vaccinated against COVID-19   ROS:  A comprehensive ROS was completed and negative except as noted per HPI  Past Medical History:  Diagnosis Date  . AMA (advanced maternal age) multigravida 35+ 08/12/2018  . Anemia    with pregnancy  . Benign paroxysmal positional vertigo 05/11/2017  . Depression    does not currently take meds  . Goiter,  nontoxic, multinodular 10/19/2008   Overview:  Nontoxic Multinodular Goiter Nml labs, [place]  . Herniated cervical disc 2016  . Hip pain   . Hyperlipidemia 03/15/2017  . Lumbar degenerative disc disease 05/25/2016  . Muscle spasm of back 12/13/2017  . Myofascial pain syndrome 08/27/2014   negative cervical spine MRI, a negative brain MRI, nerve conduction and electromyography, and normal extensive blood work for peripheral neuropathy and myopathy.   . Nontoxic multinodular goiter 10/19/2008   Overview:  Nontoxic Multinodular Goiter  . Overactive bladder 09/24/2014  . Patellofemoral pain syndrome 06/04/2015  . Right tennis elbow   . Subconjunctival hematoma, left 09/14/2017  . Supervision of other high risk pregnancy, antepartum 08/12/2018    Nursing Staff Provider Office Location  [facility] Dating  10/LMP Language  English Anatomy US   normal Flu Vaccine  08/26/18 Genetic Screen  NIPS:Negative  AFP:  Normal  First Screen:  Quad:   TDaP vaccine   12/16/18 Hgb A1C or  GTT Early  Third trimester  Rhogam  NA   LAB RESULTS  Feeding Plan  Breast Blood Type AB/RH(D) POSITIVE/-- (04/18 9826)  Contraception  NFP Antibody NO ANTIBODIES DETECTED (04/  . Thyroid goiter    benign  . Varicose vein of leg 06/04/2015    Past Surgical History:  Procedure Laterality Date  . TONSILLECTOMY  1986    Family History  Problem Relation Age of Onset  . Hyperlipidemia Father   . Birth defects Maternal Grandmother   . Cancer Maternal Grandmother  Ovarian  . Birth defects Maternal Grandfather   . Cancer Maternal Grandfather   . Breast cancer Maternal Aunt 42  . Breast cancer Maternal Aunt 50  . Hypertension Sister   . Breast cancer Sister     Social History   Socioeconomic History  . Marital status: Married    Spouse name: Trinna Post  . Number of children: 1  . Years of education: 12+  . Highest education level: Not on file  Occupational History  . 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 Smoking status: Never Smoker  . Smokeless tobacco: Never Used  Vaping Use  . Vaping Use: Never used  Substance and Sexual Activity  . Alcohol use: Not Currently    Alcohol/week: 0.0 standard drinks    Comment: socially  . Drug use: No  . Sexual activity: Yes    Birth control/protection: None  Other Topics Concern  . Not on file  Social History Narrative   Patient lives at home with husband and daughter.    Patient is right handed   Patient has one child   Patient has a college degree.    Caffeine: tea 1 cup   Social Determinants of Health   Financial Resource Strain:   . Difficulty of Paying Living Expenses:   Food Insecurity:   . Worried About Programme researcher, broadcasting/film/video in the Last Year:   . Barista in the Last Year:   Transportation Needs:   . Freight forwarder (Medical):   Marland Kitchen Lack of Transportation (Non-Medical):   Physical Activity:   . Days of Exercise per Week:   . Minutes of Exercise per Session:   Stress:   . Feeling of Stress :   Social Connections:   . Frequency of Communication with Friends and Family:   . Frequency of Social Gatherings with Friends and Family:   . Attends Religious Services:   . Active Member of Clubs or Organizations:   . Attends Banker Meetings:   Marland Kitchen Marital Status:   Intimate Partner Violence:   . Fear of Current or Ex-Partner:   . Emotionally Abused:   Marland Kitchen Physically Abused:   . Sexually Abused:      Current Outpatient Medications:  .  Fremanezumab-vfrm (AJOVY) 225 MG/1.5ML SOAJ, Inject 225 mg into the skin every 30 (thirty) days., Disp: 1 pen, Rfl: 11 .  Fremanezumab-vfrm (AJOVY) 225 MG/1.5ML SOAJ, Inject 225 mg into the skin every 30 (thirty) days., Disp: 1 pen, Rfl: 0 .  doxycycline (VIBRA-TABS) 100 MG tablet, Take 1 tablet (100 mg total) by mouth 2 (two) times daily., Disp: 20 tablet, Rfl: 0 .  HYDROcodone-homatropine (HYCODAN) 5-1.5 MG/5ML syrup, Take 5 mLs by mouth at bedtime as needed for  cough., Disp: 75 mL, Rfl: 0  EXAM:  VITALS per patient if applicable: Temp 98.1 F (36.7 C)   Wt 145 lb (65.8 kg)   BMI 24.13 kg/m   GENERAL: alert, oriented, appears well and in no acute distress  HEENT: atraumatic, conjunttiva clear, no obvious abnormalities on inspection of external nose and ears  NECK: normal movements of the head and neck  LUNGS: on inspection no signs of respiratory distress, breathing rate appears normal, no obvious gross SOB, gasping or wheezing  CV: no obvious cyanosis  MS: moves all visible extremities without noticeable abnormality  PSYCH/NEURO: pleasant and cooperative, no obvious depression or anxiety, speech and thought processing grossly intact  ASSESSMENT AND PLAN:  Discussed the following assessment and plan:  Acute bronchitis Worsening symptoms over the past week. Will add course of doxycycline. Hycodan at bedtime as needed for cough.  Continue supportive care with increased fluids and rest.  Contact clinic for follow up if not improving or symptoms continue to worsen.      I discussed the assessment and treatment plan with the patient. The patient was provided an opportunity to ask questions and all were answered. The patient agreed with the plan and demonstrated an understanding of the instructions.   The patient was advised to call back or seek an in-person evaluation if the symptoms worsen or if the condition fails to improve as anticipated.    Everrett Coombe, DO

## 2020-09-20 ENCOUNTER — Ambulatory Visit (INDEPENDENT_AMBULATORY_CARE_PROVIDER_SITE_OTHER): Payer: Federal, State, Local not specified - PPO | Admitting: Sports Medicine

## 2020-09-20 DIAGNOSIS — M7711 Lateral epicondylitis, right elbow: Secondary | ICD-10-CM | POA: Insufficient documentation

## 2020-09-20 MED ORDER — DICLOFENAC SODIUM 75 MG PO TBEC: 75.0000 mg | DELAYED_RELEASE_TABLET | Freq: Two times a day (BID) | ORAL | 3 refills | Status: DC

## 2020-09-20 NOTE — Progress Notes (Signed)
° ° °  Procedures performed today:    None.  Independent interpretation of notes and tests performed by another provider:   None.  Brief History, Exam, Impression, and Recommendations:    Lateral epicondylitis, right elbow Pleasant 39 year old female with 2 weeks of pain in her elbow, lateral aspect, tender over the lateral epicondyle. Adding a tennis elbow brace, diclofenac, conditioning exercises, return to see me in 3 to 4 weeks, injection if no better. She just finished a move, and has been doing a lot of data entry at work.    ___________________________________________ Ihor Austin. Benjamin Stain, M.D., ABFM., CAQSM. Primary Care and Sports Medicine [facility]  Adjunct Instructor of Family Medicine  [employer]

## 2020-09-20 NOTE — Assessment & Plan Note (Signed)
Pleasant 39 year old female with 2 weeks of pain in her elbow, lateral aspect, tender over the lateral epicondyle. Adding a tennis elbow brace, diclofenac, conditioning exercises, return to see me in 3 to 4 weeks, injection if no better. She just finished a move, and has been doing a lot of data entry at work.

## 2020-10-08 DIAGNOSIS — Z20822 Contact with and (suspected) exposure to covid-19: Secondary | ICD-10-CM | POA: Diagnosis not present

## 2020-10-08 DIAGNOSIS — U071 COVID-19: Secondary | ICD-10-CM | POA: Diagnosis not present

## 2020-10-18 ENCOUNTER — Ambulatory Visit: Payer: Federal, State, Local not specified - PPO | Admitting: Sports Medicine

## 2020-10-28 ENCOUNTER — Ambulatory Visit (INDEPENDENT_AMBULATORY_CARE_PROVIDER_SITE_OTHER): Payer: Federal, State, Local not specified - PPO

## 2020-10-28 ENCOUNTER — Ambulatory Visit (INDEPENDENT_AMBULATORY_CARE_PROVIDER_SITE_OTHER): Payer: Federal, State, Local not specified - PPO | Admitting: Sports Medicine

## 2020-10-28 DIAGNOSIS — Z23 Encounter for immunization: Secondary | ICD-10-CM

## 2020-10-28 DIAGNOSIS — M7711 Lateral epicondylitis, right elbow: Secondary | ICD-10-CM

## 2020-10-28 NOTE — Progress Notes (Signed)
    Procedures performed today:    Procedure: Real-time Ultrasound Guided injection of the right common extensor tendon origin Device: Samsung HS60  Verbal informed consent obtained.  Time-out conducted.  Noted no overlying erythema, induration, or other signs of local infection.  Skin prepped in a sterile fashion.  Local anesthesia: Topical Ethyl chloride.  With sterile technique and under real time ultrasound guidance:  Noted hypoechoic change at the origin of the common extensor tendon, 1 cc Kenalog 40, 1 cc lidocaine, 1 cc bupivacaine injected easily Completed without difficulty  Advised to call if fevers/chills, erythema, induration, drainage, or persistent bleeding.  Images permanently stored and available for review in PACS.  Impression: Technically successful ultrasound guided injection.  Independent interpretation of notes and tests performed by another provider:   None.  Brief History, Exam, Impression, and Recommendations:    Lateral epicondylitis, right elbow Persistent discomfort in spite of home rehab exercises, we are going to do a injection today, return to see me in a month.    ___________________________________________ Ihor Austin. Benjamin Stain, M.D., ABFM., CAQSM. Primary Care and Sports Medicine [facility] [facility]  Adjunct Instructor of Family Medicine  [employer]

## 2020-10-28 NOTE — Assessment & Plan Note (Signed)
Persistent discomfort in spite of home rehab exercises, we are going to do a injection today, return to see me in a month.

## 2020-11-19 ENCOUNTER — Ambulatory Visit: Payer: Federal, State, Local not specified - PPO | Admitting: Advanced Practice Midwife

## 2020-11-25 ENCOUNTER — Ambulatory Visit: Payer: Federal, State, Local not specified - PPO | Admitting: Sports Medicine

## 2020-12-03 ENCOUNTER — Ambulatory Visit: Payer: Federal, State, Local not specified - PPO | Admitting: Obstetrics and Gynecology

## 2020-12-10 ENCOUNTER — Other Ambulatory Visit: Payer: Self-pay | Admitting: Neurology

## 2021-01-03 ENCOUNTER — Ambulatory Visit: Payer: Federal, State, Local not specified - PPO | Admitting: Obstetrics and Gynecology

## 2021-01-16 ENCOUNTER — Ambulatory Visit: Payer: Federal, State, Local not specified - PPO | Admitting: Obstetrics and Gynecology

## 2021-01-21 ENCOUNTER — Telehealth: Payer: Self-pay | Admitting: Family Medicine

## 2021-01-21 MED ORDER — AJOVY 225 MG/1.5ML ~~LOC~~ SOAJ: SUBCUTANEOUS | 5 refills | Status: DC

## 2021-01-21 NOTE — Addendum Note (Signed)
Addended by: Guy Begin on: 01/21/2021 05:28 PM   Modules accepted: Orders

## 2021-01-21 NOTE — Telephone Encounter (Signed)
Pt is asking for a refill on her Fremanezumab-vfrm (AJOVY) 225 MG/1.5ML SOAJ and Ondansetron 4 mg to [id number]

## 2021-01-24 ENCOUNTER — Ambulatory Visit (INDEPENDENT_AMBULATORY_CARE_PROVIDER_SITE_OTHER): Payer: Federal, State, Local not specified - PPO | Admitting: Certified Nurse Midwife

## 2021-01-24 ENCOUNTER — Other Ambulatory Visit (HOSPITAL_COMMUNITY)
Admission: RE | Admit: 2021-01-24 | Discharge: 2021-01-24 | Disposition: A | Discharge status: Home / Self Care | Payer: Federal, State, Local not specified - PPO | Source: Ambulatory Visit | Attending: Certified Nurse Midwife | Admitting: Certified Nurse Midwife

## 2021-01-24 ENCOUNTER — Encounter: Payer: Self-pay | Admitting: Certified Nurse Midwife

## 2021-01-24 ENCOUNTER — Other Ambulatory Visit: Payer: Self-pay

## 2021-01-24 VITALS — BP 135/87 | HR 80 | Ht 65.0 in | Wt 142.0 lb

## 2021-01-24 DIAGNOSIS — Z01419 Encounter for gynecological examination (general) (routine) without abnormal findings: Secondary | ICD-10-CM | POA: Diagnosis not present

## 2021-01-24 NOTE — Progress Notes (Signed)
Gynecology Annual Exam   History of Present Illness: Dawn [name] is a 40 y.o. married female presenting for an annual exam. She has no complaints today. She is sexually active. She denies dyspareunia. She does not perform self breast exams. There is notable family history of breast or ovarian cancer in her family.   Past Medical History:  Past Medical History:  Diagnosis Date  . AMA (advanced maternal age) multigravida 35+ 08/12/2018  . Anemia    with pregnancy  . Benign paroxysmal positional vertigo 05/11/2017  . Depression    does not currently take meds  . Goiter, nontoxic, multinodular 10/19/2008   Overview:  Nontoxic Multinodular Goiter Nml labs, [place]  . Herniated cervical disc 2016  . Hip pain   . Hyperlipidemia 03/15/2017  . Lumbar degenerative disc disease 05/25/2016  . Muscle spasm of back 12/13/2017  . Myofascial pain syndrome 08/27/2014   negative cervical spine MRI, a negative brain MRI, nerve conduction and electromyography, and normal extensive blood work for peripheral neuropathy and myopathy.   . Nontoxic multinodular goiter 10/19/2008   Overview:  Nontoxic Multinodular Goiter  . Overactive bladder 09/24/2014  . Patellofemoral pain syndrome 06/04/2015  . Right tennis elbow   . Subconjunctival hematoma, left 09/14/2017  . Supervision of other high risk pregnancy, antepartum 08/12/2018    Nursing Staff Provider Office Location  [facility] Dating  10/LMP Language  English Anatomy US   normal Flu Vaccine  08/26/18 Genetic Screen  NIPS:Negative  AFP:  Normal  First Screen:  Quad:   TDaP vaccine   12/16/18 Hgb A1C or  GTT Early  Third trimester  Rhogam  NA   LAB RESULTS  Feeding Plan  Breast Blood Type AB/RH(D) POSITIVE/-- (04/18 4270)  Contraception  NFP Antibody NO ANTIBODIES DETECTED (04/  . Thyroid goiter    benign  . Varicose vein of leg 06/04/2015    Past Surgical History:  Past Surgical History:  Procedure Laterality Date  . TONSILLECTOMY  1986    Gynecologic History:   LMP: Patient's last menstrual period was 01/13/2021. Average Interval: regular Heavy Menses: no Clots: no Intermenstrual Bleeding: no Postcoital Bleeding: no Dysmenorrhea: no Contraception: condoms and rhythm method Last Pap: completed on 02/06/16 ; result was: NIL and HR HPV negative  Mammogram: last completed on 03/2020, result was normal.  Obstetric History: [id number]  Family History:  Family History  Problem Relation Age of Onset  . Hyperlipidemia Father   . Birth defects Maternal Grandmother   . Cancer Maternal Grandmother        Ovarian  . Birth defects Maternal Grandfather   . Cancer Maternal Grandfather   . Breast cancer Maternal Aunt 42  . Breast cancer Maternal Aunt 50  . Hypertension Sister   . Breast cancer Sister     Social History:  Social History   Socioeconomic History  . Marital status: Married    Spouse name: Trinna Post  . Number of children: 1  . Years of education: 12+  . Highest education level: Not on file  Occupational History  . 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 Smoking status: Never Smoker  . Smokeless tobacco: Never Used  Vaping Use  . Vaping Use: Never used  Substance and Sexual Activity  . Alcohol use: Not Currently    Alcohol/week: 0.0 standard drinks    Comment: socially  . Drug use: No  . Sexual activity: Yes    Birth control/protection: None  Other  Topics Concern  . Not on file  Social History Narrative   Patient lives at home with husband and daughter.    Patient is right handed   Patient has one child   Patient has a college degree.    Caffeine: tea 1 cup   Social Determinants of Health   Financial Resource Strain: Not on file  Food Insecurity: Not on file  Transportation Needs: Not on file  Physical Activity: Not on file  Stress: Not on file  Social Connections: Not on file  Intimate Partner Violence: Not on file    Allergies:  Allergies  Allergen Reactions  . Latex  Other (See Comments)    Reaction: slight irritation    Medications: Prior to Admission medications   Medication Sig Start Date End Date Taking? Authorizing Provider  Fremanezumab-vfrm (AJOVY) 225 MG/1.5ML SOAJ INJECT 225 MG INTO THE SKIN EVERY 30 DAYS. 01/21/21  Yes Lomax, Amy, NP  ondansetron (ZOFRAN-ODT) 4 MG disintegrating tablet Take 4-8 mg by mouth every 8 (eight) hours as needed. 12/05/20  Yes [provider]  diclofenac (VOLTAREN) 75 MG EC tablet Take 1 tablet (75 mg total) by mouth 2 (two) times daily. 09/20/20 09/20/21  Monica Becton, MD    Review of Systems: negative except noted in HPI  Physical Exam Vitals: BP 135/87   Pulse 80   Ht 5\' 5"  (1.651 m)   Wt 142 lb (64.4 kg)   LMP 01/13/2021   BMI 23.63 kg/m  General: NAD HEENT: normocephalic, atraumatic Thyroid: no enlargement, no palpable nodules Pulmonary: Normal rate and effort, CTAB Cardiovascular: RRR Breast: Breast symmetrical, no tenderness, no palpable nodules or masses, no skin or nipple retraction present, no nipple discharge. No axillary or supraclavicular lymphadenopathy. Abdomen: soft, non-tender, non-distended. No hepatomegaly, splenomegaly or masses palpable. No evidence of hernia  Genitourinary:  External: Normal external female genitalia. Normal urethral meatus  Vagina: Normal vaginal mucosa, no evidence of prolapse   Cervix: Grossly normal in appearance, no bleeding  Uterus: Non-enlarged, mobile, normal contour. No CMT  Adnexa: non-enlarged, no masses  Rectal: deferred Extremities: no edema, erythema, or tenderness Neurologic: Grossly intact Psychiatric: mood appropriate, affect full  Female chaperone present for pelvic and breast portions of the physical exam  Assessment:  1. Well woman exam with routine gynecological exam     Plan: Mammogram due in April, ordered Follow up with GYN in 1 year or prn Follow up with PCP as planned  May, CNM 01/24/2021 11:52 AM

## 2021-01-28 LAB — CYTOLOGY - PAP
Comment: NEGATIVE
Diagnosis: NEGATIVE
High risk HPV: NEGATIVE

## 2021-03-11 ENCOUNTER — Telehealth: Payer: Self-pay

## 2021-03-11 NOTE — Telephone Encounter (Signed)
I have submitted a PA request for Ajovy on CMM, Key: [id number].  Awaiting determination

## 2021-03-12 ENCOUNTER — Other Ambulatory Visit: Payer: Self-pay | Admitting: Neurology

## 2021-03-19 ENCOUNTER — Other Ambulatory Visit: Payer: Self-pay

## 2021-03-19 ENCOUNTER — Ambulatory Visit (INDEPENDENT_AMBULATORY_CARE_PROVIDER_SITE_OTHER): Payer: Federal, State, Local not specified - PPO

## 2021-03-19 DIAGNOSIS — Z1231 Encounter for screening mammogram for malignant neoplasm of breast: Secondary | ICD-10-CM | POA: Diagnosis not present

## 2021-03-19 DIAGNOSIS — Z803 Family history of malignant neoplasm of breast: Secondary | ICD-10-CM | POA: Diagnosis not present

## 2021-03-27 ENCOUNTER — Ambulatory Visit: Payer: Federal, State, Local not specified - PPO | Admitting: Sports Medicine

## 2021-03-27 ENCOUNTER — Ambulatory Visit (INDEPENDENT_AMBULATORY_CARE_PROVIDER_SITE_OTHER): Payer: Federal, State, Local not specified - PPO

## 2021-03-27 ENCOUNTER — Other Ambulatory Visit: Payer: Self-pay

## 2021-03-27 DIAGNOSIS — M7711 Lateral epicondylitis, right elbow: Secondary | ICD-10-CM | POA: Diagnosis not present

## 2021-03-27 MED ORDER — HYDROCODONE-ACETAMINOPHEN 10-325 MG PO TABS: 1.0000 | ORAL_TABLET | Freq: Three times a day (TID) | ORAL | 0 refills | Status: DC | PRN

## 2021-03-27 NOTE — Patient Instructions (Signed)
? ?Platelet-rich plasma is used in musculoskeletal medicine to focus your own body?s ability to ?heal. It has several well-done published randomized control trials (RCT) which demonstrate both ?its effectiveness and safety in many musculoskeletal conditions, including osteoarthritis, ?tendinopathies, and damaged vertebral discs. PRP has been in clinical use since the 1990?s. ?Many people know that platelets form a clot if there is a cut in the skin. It turns out that ?platelets do not only form a clot, they also start the body?s own repair process. When platelets ?activate to form a clot, they also release alpha granules which have hundreds of chemical ?messengers in them that initiate and organize repair to the damaged tissue. Precisely placing ?PRP into the site of injury will initiate the healing process by activating on the damaged ?cartilage or tendon. This is an inflammatory process, and inflammation is the vital first phase of ?Healing. ? ?What to expect and how to prepare for PRP ? ? 2 weeks prior to the procedure: depending on the procedure, you may need to arrange ?for a driver to bring you home. IF you are having a lower extremity procedure, we can provide crutches ?as needed. ? ? 7 days prior to the procedure: Stop taking anti-inflammatory drugs like ibuprofen, ?Naprosyn, Celebrex, or Meloxicam. Let Dr. Rhetta Cleek know if you have been taking prednisone or other ?corticosteroids in the last month. ? ? The day before the procedure: thoroughly shower and clean your skin.  ? ? The day of the procedure: Wear loose-fitting clothing like sweatpants or shorts. If you ?are having an upper body procedure wear a top that can button or zip up. ? ?PRP will initiate healing and a productive inflammation, and PRP therapy will make the body ?part treated sore for 4 days to two weeks. Anti-inflammatory drugs (i.e. ibuprofen, Naprosyn, ?Celebrex) and corticosteroids such as prednisone can blunt or stop this process,  so it is ?important to not take any anti-inflammatory drugs for 7 days before getting PRP therapy, or for ?at least three weeks after PRP therapy. Corticosteroid injections can blunt inflammation for 30 ?days, so let us know if you have had one recently. Depending on the body part injected, you ?may be in a sling or on crutches for several days. Just like wringing out a wet dishcloth, if you ?load or tense a tendon or ligament that has just been injected with PRP, some of the PRP ?injected will squish out. By keeping the body part treated relaxed by using a sling (for the ?shoulder or arm) or crutches (for hips and legs) for a few days, the PRP can bind in place and do ?its job.  ? ?You may need a driver to bring you home.  ?Tobacco/nicotine is a potent toxin and its use constricts small blood vessels which are needed for tissue repair.  ?Tobacco/nicotine use will limit the effectiveness of any treatment and stopping tobacco use is one of the single  ?greatest actions you can take to improve your health. Avoid toxins like alcohol, which inhibits and depresses the ?cells needed for tissue repair. ? ?What happens during the PRP procedure? ? ?Platelet rich plasma is made by taking some of your blood and performing a two-stage ?centrifuge process on it to concentrate the PRP. First, your blood is drawn into a syringe with a ?small amount of anti-coagulant in it (this is to keep the blood from clotting during this process). ?The amount of blood drawn is usually about 10-30 milliliters, depending on how much PRP is needed for   the treatment.  ?(There are 355 milliliters in a 12-ounce soda can for comparison).  ?Then the blood is transferred in a sterile fashion into a ?centrifuge tube. It is then centrifuged for the first cycle where the red blood cells are isolated ?and discarded. In the second centrifuge cycle, the platelet-rich fraction of the remaining plasma ?is concentrated and placed in a syringe. The skin at the  injection site is numbed with a small ?amount of topical cooling spray. Dr Jaimi Belle will then precisely inject the PRP ?into the injury site using ultrasound guidance. ? ?What to do after your procedure ? ?I will give you specific medicine to control any discomfort you may have after the procedure. ?Avoid NSAIDs like ibuprofen. ?Acetaminophen can be used for mild pain.  ?Depending on the part of the body ?treated, usually you will be placed in a sling or on crutches for 1 to 3 days. Do your best not to ?tense or load the treated area during this time. After 3 days, unless otherwise instructed, the ?treated body part should be used and slowly moved through its full range of motion. It will be ?sore, but you will not be doing damage by moving it, in fact it needs to move to heal. If you ?were on crutches for a period of time, walking is ok once you are off the crutches. For now, ?avoid activities that specifically hurt you before being treated. Exercise is vital to good health ?and finding a way to cross train around your injury is important not only for your physical ?health, but for your mental health as well. Ask me about cross training options for your injury. ?Some brief (10 minutes or less) period of heat or ice therapy will not hurt the therapy, but it is ?not required. Usually, depending on the initial injury, physical therapy is started from two ?weeks to four weeks after injection. Improvements in pain and function should be expected ?from 8 weeks to 12 weeks after injection and some injuries may require more than one ?treatment.  ? ? ?___________________________________________ ?Dawn [name] J. Dawn [name], M.D., ABFM., CAQSM. ?Primary Care and Sports Medicine ?[facility] [address] ? ?Adjunct Professor of Family Medicine  ?[employer] ? ?

## 2021-03-27 NOTE — Progress Notes (Addendum)
    Procedures performed today:    Procedure: Real-time Ultrasound Guided Platelet Rich Plasma (PRP) Injection/percutaneous tenotomy of right common extensor tendon origin Device: Samsung HS60  Verbal informed consent obtained.  Time-out conducted.  Noted no overlying erythema, induration, or other signs of local infection.  Obtained 30 cc of blood from peripheral vein, using the "PEAK" centrifuge, red blood cells were separated from the plasma. Subsequently red blood cells were drained leaving only plasma with the buffy coat layer between the desired lines. Platelet poor plasma was then centrifuged out, and remaining platelet rich plasma aspirated into a 5 cc syringe.  Skin prepped in a sterile fashion.  Local anesthesia: Topical Ethyl chloride.  With sterile technique and under real time ultrasound guidance the platelet rich plasma (PRP) obtained above: Using a 22-gauge needle I injected 3 mL of lidocaine and 3 mL of bupivacaine superficial to and deep to the common extensor tendon, syringe switched and using platelet rich plasma I made approximately 50 passes through the origin of the common extensor tendon while injecting PRP. Completed without difficulty  Advised to call if fevers/chills, erythema, induration, drainage, or persistent bleeding.  Images permanently stored and available for review in PACS.  Impression: Technically successful ultrasound guided Platelet Rich Plasma (PRP) injection.  Independent interpretation of notes and tests performed by another provider:   None.  Brief History, Exam, Impression, and Recommendations:    Lateral epicondylitis, right elbow Dawn [name] returns, she has failed multiple conservative modalities including rehab exercises, bracing, injections. Today we did a percutaneous tenotomy of the right common extensor tendon origin with platelet rich plasma. High-dose hydrocodone for postprocedural pain. Return to see me in 2  weeks.    ___________________________________________ Ihor Austin. Benjamin Stain, M.D., ABFM., CAQSM. Primary Care and Sports Medicine [facility]  Adjunct Instructor of Family Medicine  [employer]

## 2021-03-27 NOTE — Telephone Encounter (Signed)
Pt has [employer]BS FEP and is not submitting through [facility] correctly. I have printed a paper PA for [facility] and have submitted that, along with clinicals. Faxed form to [phone].

## 2021-03-27 NOTE — Assessment & Plan Note (Signed)
Dawn [name] returns, she has failed multiple conservative modalities including rehab exercises, bracing, injections. Today we did a percutaneous tenotomy of the right common extensor tendon origin with platelet rich plasma. High-dose hydrocodone for postprocedural pain. Return to see me in 2 weeks.

## 2021-04-01 NOTE — Telephone Encounter (Addendum)
Called and LMVM for pt that her insurance AJOVY not covered.  Last seen 04-2020.  Has appt 04-2021, come in sooner  Has 0730 or 1530 04-02-21 AL/NP if available when she calls.  ? Using copay card?

## 2021-04-01 NOTE — Telephone Encounter (Signed)
Received fax from [facility] Clinical Call Center stating that "We received the request for AJOVY. AJOVY is not covered on the benefit plan. Please visit [email or web address] for formulary information."

## 2021-04-01 NOTE — Telephone Encounter (Signed)
Can she use copay card? I do not think she has tried and failed Amovig or Emgality. If not, does she have enough to cover her until we can talk about other options? She has an appt in May.

## 2021-04-02 NOTE — Telephone Encounter (Signed)
Called pt.  She states that she has not taken ajovy since 10/2020 ? Making her irritable/personality change.  Has stopped and noted that her sx/dizziness/headache sporadic (here and there), takes zofran helps when has nausea sz.  No need for earlier appt at this time. Will call back when needed.

## 2021-04-10 ENCOUNTER — Telehealth (INDEPENDENT_AMBULATORY_CARE_PROVIDER_SITE_OTHER): Payer: Federal, State, Local not specified - PPO | Admitting: Sports Medicine

## 2021-04-10 DIAGNOSIS — M7711 Lateral epicondylitis, right elbow: Secondary | ICD-10-CM | POA: Diagnosis not present

## 2021-04-10 NOTE — Progress Notes (Signed)
   Virtual Visit via WebEx/MyChart   I connected with  Dawn [name]  on 04/10/21 via WebEx/MyChart/Doximity Video and verified that I am speaking with the correct person using two identifiers.   I discussed the limitations, risks, security and privacy concerns of performing an evaluation and management service by WebEx/MyChart/Doximity Video, including the higher likelihood of inaccurate diagnosis and treatment, and the availability of in person appointments.  We also discussed the likely need of an additional face to face encounter for complete and high quality delivery of care.  I also discussed with the patient that there may be a patient responsible charge related to this service. The patient expressed understanding and wishes to proceed.  Provider location is in medical facility. Patient location is at their home, different from provider location. People involved in care of the patient during this telehealth encounter were myself, my nurse/medical assistant, and my front office/scheduling team member.  Review of Systems: No fevers, chills, night sweats, weight loss, chest pain, or shortness of breath.   Objective Findings:    General: Speaking full sentences, no audible heavy breathing.  Sounds alert and appropriately interactive.  Appears well.  Face symmetric.  Extraocular movements intact.  Pupils equal and round.  No nasal flaring or accessory muscle use visualized.  Independent interpretation of tests performed by another provider:   None.  Brief History, Exam, Impression, and Recommendations:    Lateral epicondylitis, right elbow Dawn [name] is now approximately 2 weeks post PRP percutaneous tenotomy of the right common extensor tendon origin, she is still having a bit of discomfort but at this juncture she is better than prior to the procedure and continue to improve daily. She will hold off on aggressive range of motion attempts for the next 2 weeks, she will continue  gentle rehab and conditioning exercises for now. And we can touch base again in 6 more weeks.   I discussed the above assessment and treatment plan with the patient. The patient was provided an opportunity to ask questions and all were answered. The patient agreed with the plan and demonstrated an understanding of the instructions.   The patient was advised to call back or seek an in-person evaluation if the symptoms worsen or if the condition fails to improve as anticipated.   I provided 30 minutes of face to face and non-face-to-face time during this encounter date, time was needed to gather information, review chart, records, communicate/coordinate with staff remotely, as well as complete documentation.   ___________________________________________ Ihor Austin. Benjamin Stain, M.D., ABFM., CAQSM. Primary Care and Sports Medicine [facility]  Adjunct Instructor of Family Medicine  [employer]

## 2021-04-10 NOTE — Assessment & Plan Note (Signed)
Dawn [name] is now approximately 2 weeks post PRP percutaneous tenotomy of the right common extensor tendon origin, she is still having a bit of discomfort but at this juncture she is better than prior to the procedure and continue to improve daily. She will hold off on aggressive range of motion attempts for the next 2 weeks, she will continue gentle rehab and conditioning exercises for now. And we can touch base again in 6 more weeks.

## 2021-04-12 ENCOUNTER — Encounter: Payer: Self-pay | Admitting: Emergency Medicine

## 2021-04-12 ENCOUNTER — Other Ambulatory Visit: Payer: Self-pay

## 2021-04-12 ENCOUNTER — Emergency Department
Admission: EM | Admit: 2021-04-12 | Discharge: 2021-04-12 | Disposition: A | Discharge status: Home / Self Care | Payer: Federal, State, Local not specified - PPO | Source: Home / Self Care

## 2021-04-12 DIAGNOSIS — J209 Acute bronchitis, unspecified: Secondary | ICD-10-CM | POA: Diagnosis not present

## 2021-04-12 MED ORDER — BENZONATATE 100 MG PO CAPS: 100.0000 mg | ORAL_CAPSULE | Freq: Three times a day (TID) | ORAL | 0 refills | Status: DC

## 2021-04-12 MED ORDER — PREDNISONE 10 MG (21) PO TBPK
ORAL_TABLET | Freq: Every day | ORAL | 0 refills | Status: AC
Start: 2021-04-12 — End: 2021-04-18

## 2021-04-12 NOTE — ED Triage Notes (Signed)
Cough for a few days  Feels more SOB since yesterday  OTC sudafed Denies fever COVID 10/21 & 1/22 COVID vaccines - no booster

## 2021-04-12 NOTE — ED Provider Notes (Signed)
[facility]    CSN: [id number] Arrival date & time: 04/12/21  0920      History   Chief Complaint Chief Complaint  Patient presents with  . Shortness of Breath  . Cough    HPI Dawn [name] is a 40 y.o. female.   Reports coughing and nasal congestion for the last 4 days.  States that last night she noticed that she was having to breathe deeper in order to feel like she is taking an adequate breath.  Denies previous symptoms.  Denies sick contacts.  Has positive history of COVID.  Has completed COVID vaccines.  Has not completed flu vaccine.  Has been taking Sudafed at home.  Denies headache, fever, abdominal pain, nausea, vomiting, diarrhea, rash, other symptoms.  ROS per HPI  The history is provided by the patient.  Shortness of Breath Associated symptoms: cough   Cough Associated symptoms: shortness of breath     Past Medical History:  Diagnosis Date  . AMA (advanced maternal age) multigravida 35+ 08/12/2018  . Anemia    with pregnancy  . Benign paroxysmal positional vertigo 05/11/2017  . Depression    does not currently take meds  . Goiter, nontoxic, multinodular 10/19/2008   Overview:  Nontoxic Multinodular Goiter Nml labs, [place]  . Herniated cervical disc 2016  . Hip pain   . Hyperlipidemia 03/15/2017  . Lumbar degenerative disc disease 05/25/2016  . Muscle spasm of back 12/13/2017  . Myofascial pain syndrome 08/27/2014   negative cervical spine MRI, a negative brain MRI, nerve conduction and electromyography, and normal extensive blood work for peripheral neuropathy and myopathy.   . Nontoxic multinodular goiter 10/19/2008   Overview:  Nontoxic Multinodular Goiter  . Overactive bladder 09/24/2014  . Patellofemoral pain syndrome 06/04/2015  . Right tennis elbow   . Subconjunctival hematoma, left 09/14/2017  . Supervision of other high risk pregnancy, antepartum 08/12/2018    Nursing Staff Provider Office Location  [facility] Dating  10/LMP Language   English Anatomy [place]   normal Flu Vaccine  08/26/18 Genetic Screen  NIPS:Negative  AFP:  Normal  First Screen:  Quad:   TDaP vaccine   12/16/18 Hgb A1C or  GTT Early  Third trimester  Rhogam  NA   LAB RESULTS  Feeding Plan  Breast Blood Type AB/RH(D) POSITIVE/-- (04/18 7412)  Contraception  NFP Antibody NO ANTIBODIES DETECTED (04/  . Thyroid goiter    benign  . Varicose vein of leg 06/04/2015    Patient Active Problem List   Diagnosis Date Noted  . Lateral epicondylitis, right elbow 09/20/2020  . Acute bronchitis 06/19/2020  . Vestibular migraine 12/25/2019  . Chronic migraine without aura without status migrainosus, not intractable 12/25/2019  . Migraine aura without headache 12/25/2019  . Migraine with aura and without status migrainosus, not intractable 12/25/2019  . Viral syndrome 09/19/2019  . Elevated PTHrP level 03/31/2018  . Muscle spasm of back 12/13/2017  . Subconjunctival hematoma, left 09/14/2017  . Vertigo 05/11/2017  . Hyperlipidemia 03/15/2017  . Lumbar degenerative disc disease 05/25/2016  . Varicose vein of leg 06/04/2015  . Patellofemoral pain syndrome 06/04/2015  . Overactive bladder 09/24/2014  . Myofascial pain syndrome 08/27/2014  . Family history of breast cancer 09/12/2013  . Nontoxic multinodular goiter 10/19/2008  . Goiter, nontoxic, multinodular 10/19/2008    Past Surgical History:  Procedure Laterality Date  . TONSILLECTOMY  1986    OB History    Gravida  3   Para  2  Term  2   Preterm      AB  1   Living  2     SAB  1   IAB      Ectopic      Multiple  0   Live Births  2            Home Medications    Prior to Admission medications   Medication Sig Start Date End Date Taking? Authorizing Provider  benzonatate (TESSALON) 100 MG capsule Take 1 capsule (100 mg total) by mouth every 8 (eight) hours. 04/12/21  Yes Moshe Cipro, NP  predniSONE (STERAPRED UNI-PAK 21 TAB) 10 MG (21) TBPK tablet Take by mouth daily for 6 days.  Take 6 tablets on day 1, 5 tablets on day 2, 4 tablets on day 3, 3 tablets on day 4, 2 tablets on day 5, 1 tablet on day 6 04/12/21 04/18/21 Yes Moshe Cipro, NP  diclofenac (VOLTAREN) 75 MG EC tablet Take 1 tablet (75 mg total) by mouth 2 (two) times daily. 09/20/20 09/20/21  Monica Becton, MD  Fremanezumab-vfrm (AJOVY) 225 MG/1.5ML SOAJ INJECT 225 MG INTO THE SKIN EVERY 30 DAYS. 01/21/21   Lomax, Amy, NP  HYDROcodone-acetaminophen (NORCO) 10-325 MG tablet Take 1 tablet by mouth every 8 (eight) hours as needed. Patient not taking: Reported on 04/12/2021 03/27/21   Monica Becton, MD  ondansetron (ZOFRAN-ODT) 4 MG disintegrating tablet DISSOLVE 1 TO 2 TABLETS IN MOUTH EVERY 8 HOURS AS NEEDED FOR NAUSEA OR DIZZINESS OR VERTIGO OR PRIOR TO EPISODES THAT MAY INCITE THESE. 03/12/21   Lomax, Amy, NP    Family History Family History  Problem Relation Age of Onset  . Hyperlipidemia Father   . Birth defects Maternal Grandmother   . Cancer Maternal Grandmother        Ovarian  . Birth defects Maternal Grandfather   . Cancer Maternal Grandfather   . Breast cancer Maternal Aunt 42  . Breast cancer Maternal Aunt 50  . Hypertension Sister   . Breast cancer Sister     Social History Social History   Tobacco Use  . Smoking status: Never Smoker  . Smokeless tobacco: Never Used  Vaping Use  . Vaping Use: Never used  Substance Use Topics  . Alcohol use: Not Currently    Alcohol/week: 0.0 standard drinks    Comment: socially  . Drug use: No     Allergies   Latex   Review of Systems Review of Systems  Respiratory: Positive for cough and shortness of breath.      Physical Exam Triage Vital Signs ED Triage Vitals  Enc Vitals Group     BP 04/12/21 0944 119/78     Pulse Rate 04/12/21 0944 98     Resp 04/12/21 0944 16     Temp 04/12/21 0944 98 F (36.7 C)     Temp Source 04/12/21 0944 Oral     SpO2 04/12/21 0944 98 %     Weight --      Height --      Head  Circumference --      Peak Flow --      Pain Score 04/12/21 0945 0     Pain Loc --      Pain Edu? --      Excl. in GC? --    No data found.  Updated Vital Signs BP 119/78 (BP Location: Right Arm)   Pulse 98   Temp 98 F (36.7 C) (Oral)  Resp 16   LMP 03/28/2021 (Exact Date)   SpO2 98%   Breastfeeding No   Visual Acuity Right Eye Distance:   Left Eye Distance:   Bilateral Distance:    Right Eye Near:   Left Eye Near:    Bilateral Near:     Physical Exam Vitals and nursing note reviewed.  Constitutional:      General: She is not in acute distress.    Appearance: She is well-developed and normal weight. She is not ill-appearing.  HENT:     Head: Normocephalic and atraumatic.     Nose: Congestion present.     Mouth/Throat:     Mouth: Mucous membranes are moist.     Pharynx: Posterior oropharyngeal erythema present.     Comments: Cobblestoning present Eyes:     Extraocular Movements: Extraocular movements intact.     Conjunctiva/sclera: Conjunctivae normal.     Pupils: Pupils are equal, round, and reactive to light.  Cardiovascular:     Rate and Rhythm: Normal rate and regular rhythm.     Heart sounds: Normal heart sounds. No murmur heard.   Pulmonary:     Effort: Pulmonary effort is normal. No respiratory distress.     Breath sounds: No stridor. Examination of the right-upper field reveals wheezing. Wheezing present. No decreased breath sounds, rhonchi or rales.  Chest:     Chest wall: No tenderness.  Abdominal:     Palpations: Abdomen is soft.     Tenderness: There is no abdominal tenderness.  Musculoskeletal:        General: Normal range of motion.     Cervical back: Normal range of motion and neck supple.  Skin:    General: Skin is warm and dry.     Capillary Refill: Capillary refill takes less than 2 seconds.  Neurological:     General: No focal deficit present.     Mental Status: She is alert and oriented to person, place, and time.  Psychiatric:         Mood and Affect: Mood normal.        Behavior: Behavior normal.      UC Treatments / Results  Labs (all labs ordered are listed, but only abnormal results are displayed) Labs Reviewed - No data to display  EKG   Radiology No results found.  Procedures Procedures (including critical care time)  Medications Ordered in UC Medications - No data to display  Initial Impression / Assessment and Plan / UC Course  I have reviewed the triage vital signs and the nursing notes.  Pertinent labs & imaging results that were available during my care of the patient were reviewed by me and considered in my medical decision making (see chart for details).    Acute bronchitis  Prescribe steroid taper Prescribed Tessalon Perles for cough as needed Had negative COVID test at home on day 4 of symptoms Declines COVID and flu testing today Follow up with this office or with primary care if symptoms are persisting.  Follow up in the ER for high fever, trouble swallowing, trouble breathing, other concerning symptoms.   Final Clinical Impressions(s) / UC Diagnoses   Final diagnoses:  Acute bronchitis, unspecified organism     Discharge Instructions     I have sent in a prednisone taper for you to take for 6 days. 6 tablets on day one, 5 tablets on day two, 4 tablets on day three, 3 tablets on day four, 2 tablets on day five, and 1 tablet  on day six.  I have sent in tessalon perles for you to use one capsule every 8 hours as needed for cough.  Follow up with this office or with primary care if symptoms are persisting.  Follow up in the ER for high fever, trouble swallowing, trouble breathing, other concerning symptoms.     ED Prescriptions    Medication Sig Dispense Auth. Provider   predniSONE (STERAPRED UNI-PAK 21 TAB) 10 MG (21) TBPK tablet Take by mouth daily for 6 days. Take 6 tablets on day 1, 5 tablets on day 2, 4 tablets on day 3, 3 tablets on day 4, 2 tablets on day 5, 1  tablet on day 6 21 tablet Moshe Cipro, NP   benzonatate (TESSALON) 100 MG capsule Take 1 capsule (100 mg total) by mouth every 8 (eight) hours. 21 capsule Moshe Cipro, NP     PDMP not reviewed this encounter.   Moshe Cipro, NP 04/12/21 1015

## 2021-04-12 NOTE — Discharge Instructions (Signed)
I have sent in a prednisone taper for you to take for 6 days. 6 tablets on day one, 5 tablets on day two, 4 tablets on day three, 3 tablets on day four, 2 tablets on day five, and 1 tablet on day six.  I have sent in tessalon perles for you to use one capsule every 8 hours as needed for cough.  Follow up with this office or with primary care if symptoms are persisting.  Follow up in the ER for high fever, trouble swallowing, trouble breathing, other concerning symptoms.  

## 2021-04-25 ENCOUNTER — Other Ambulatory Visit: Payer: Self-pay

## 2021-04-25 ENCOUNTER — Encounter: Payer: Self-pay | Admitting: Physician Assistant

## 2021-04-25 ENCOUNTER — Ambulatory Visit (INDEPENDENT_AMBULATORY_CARE_PROVIDER_SITE_OTHER): Payer: Federal, State, Local not specified - PPO

## 2021-04-25 ENCOUNTER — Ambulatory Visit: Payer: Federal, State, Local not specified - PPO | Admitting: Physician Assistant

## 2021-04-25 VITALS — BP 123/67 | HR 80 | Temp 98.0°F | Ht 65.0 in | Wt 143.0 lb

## 2021-04-25 DIAGNOSIS — M549 Dorsalgia, unspecified: Secondary | ICD-10-CM

## 2021-04-25 DIAGNOSIS — R059 Cough, unspecified: Secondary | ICD-10-CM

## 2021-04-25 DIAGNOSIS — R52 Pain, unspecified: Secondary | ICD-10-CM | POA: Diagnosis not present

## 2021-04-25 NOTE — Patient Instructions (Signed)
Costochondritis  Costochondritis is irritation and swelling (inflammation) of the tissue that connects the ribs to the breastbone (sternum). This tissue is called cartilage. Costochondritis causes pain in the front of the chest. Usually, the pain:  Starts slowly.  Is in more than one rib. What are the causes? The exact cause of this condition is not always known. It results from stress on the tissue in the affected area. The cause of this stress could be:  Chest injury.  Exercise or activity, such as lifting.  Very bad coughing. What increases the risk? You are more likely to develop this condition if you:  Are female.  Are 30-40 years old.  Recently started a new exercise or work activity.  Have low levels of vitamin D.  Have a condition that makes you cough often. What are the signs or symptoms? The main symptom of this condition is chest pain. The pain:  Usually starts slowly and can be sharp or dull.  Gets worse with deep breathing, coughing, or exercise.  Gets better with rest.  May be worse when you press on the affected area of your ribs and breastbone. How is this treated? This condition usually goes away on its own over time. Your doctor may prescribe an NSAID, such as ibuprofen. This can help reduce pain and inflammation. Treatment may also include:  Resting and avoiding activities that make pain worse.  Putting heat or ice on the painful area.  Doing exercises to stretch your chest muscles. If these treatments do not help, your doctor may inject a numbing medicine to help relieve the pain. Follow these instructions at home: Managing pain, stiffness, and swelling  If told, put ice on the painful area. To do this: ? Put ice in a plastic bag. ? Place a towel between your skin and the bag. ? Leave the ice on for 20 minutes, 2-3 times a day.  If told, put heat on the affected area. Do this as often as told by your doctor. Use the heat source that your  doctor recommends, such as a moist heat pack or a heating pad. ? Place a towel between your skin and the heat source. ? Leave the heat on for 20-30 minutes. ? Take off the heat if your skin turns bright red. This is very important if you cannot feel pain, heat, or cold. You may have a greater risk of getting burned.      Activity  Rest as told by your doctor.  Do not do anything that makes your pain worse. This includes any activities that use chest, belly (abdomen), and side muscles.  Do not lift anything that is heavier than 10 lb (4.5 kg), or the limit that you are told, until your doctor says that it is safe.  Return to your normal activities as told by your doctor. Ask your doctor what activities are safe for you. General instructions  Take over-the-counter and prescription medicines only as told by your doctor.  Keep all follow-up visits as told by your doctor. This is important. Contact a doctor if:  You have chills or a fever.  Your pain does not go away or it gets worse.  You have a cough that does not go away. Get help right away if:  You are short of breath.  You have very bad chest pain that is not helped by medicines, heat, or ice. These symptoms may be an emergency. Do not wait to see if the symptoms will go away. Get   medical help right away. Call your local emergency services (911 in the [place] Do not drive yourself to the hospital. Summary  Costochondritis is irritation and swelling (inflammation) of the tissue that connects the ribs to the breastbone (sternum).  This condition causes pain in the front of the chest.  Treatment may include medicines, rest, heat or ice, and exercises. This information is not intended to replace advice given to you by your health care provider. Make sure you discuss any questions you have with your health care provider. Document Revised: 10/13/2019 Document Reviewed: 10/13/2019 Elsevier Patient Education  2021 Elsevier Inc.  

## 2021-04-25 NOTE — Progress Notes (Addendum)
Acute Office Visit  Subjective:    Patient ID: Dawn [name], female    DOB: [birth year], 40 y.o.   MRN: [id number]  Chief Complaint  Patient presents with  . Breathing Problem    HPI 40 year old female presents for pain with inspiration. She was treated for acute bronchitis 2 weeks ago and was given tessalon pearls and prednisone taper. She completed the steroid taper a week ago and started to have mid back pain with deep breaths a few days later. Her cough improved with the prednisone and she is feeling well overall. This morning, she had one episode of coughing up white mucus. She denies pain with other movements and took ibuprofen a few days ago. She denies any fever, chills, or chest pain.   Past Medical History:  Diagnosis Date  . AMA (advanced maternal age) multigravida 35+ 08/12/2018  . Anemia    with pregnancy  . Benign paroxysmal positional vertigo 05/11/2017  . Depression    does not currently take meds  . Goiter, nontoxic, multinodular 10/19/2008   Overview:  Nontoxic Multinodular Goiter Nml labs, [place]  . Herniated cervical disc 2016  . Hip pain   . Hyperlipidemia 03/15/2017  . Lumbar degenerative disc disease 05/25/2016  . Muscle spasm of back 12/13/2017  . Myofascial pain syndrome 08/27/2014   negative cervical spine MRI, a negative brain MRI, nerve conduction and electromyography, and normal extensive blood work for peripheral neuropathy and myopathy.   . Nontoxic multinodular goiter 10/19/2008   Overview:  Nontoxic Multinodular Goiter  . Overactive bladder 09/24/2014  . Patellofemoral pain syndrome 06/04/2015  . Right tennis elbow   . Subconjunctival hematoma, left 09/14/2017  . Supervision of other high risk pregnancy, antepartum 08/12/2018    Nursing Staff Provider Office Location  [facility] Dating  10/LMP Language  English Anatomy [place]   normal Flu Vaccine  08/26/18 Genetic Screen  NIPS:Negative  AFP:  Normal  First Screen:  Quad:   TDaP vaccine   12/16/18 Hgb A1C or   GTT Early  Third trimester  Rhogam  NA   LAB RESULTS  Feeding Plan  Breast Blood Type AB/RH(D) POSITIVE/-- (04/18 7412)  Contraception  NFP Antibody NO ANTIBODIES DETECTED (04/  . Thyroid goiter    benign  . Varicose vein of leg 06/04/2015    Past Surgical History:  Procedure Laterality Date  . TONSILLECTOMY  1986    Family History  Problem Relation Age of Onset  . Hyperlipidemia Father   . Birth defects Maternal Grandmother   . Cancer Maternal Grandmother        Ovarian  . Birth defects Maternal Grandfather   . Cancer Maternal Grandfather   . Breast cancer Maternal Aunt 42  . Breast cancer Maternal Aunt 50  . Hypertension Sister   . Breast cancer Sister     Social History   Socioeconomic History  . Marital status: Married    Spouse name: Trinna Post  . Number of children: 1  . Years of education: 12+  . Highest education level: Not on file  Occupational History  . 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 Smoking status: Never Smoker  . Smokeless tobacco: Never Used  Vaping Use  . Vaping Use: Never used  Substance and Sexual Activity  . Alcohol use: Not Currently    Alcohol/week: 0.0 standard drinks    Comment: socially  . Drug use: No  . Sexual activity: Yes  Birth control/protection: None  Other Topics Concern  . Not on file  Social History Narrative   Patient lives at home with husband and daughter.    Patient is right handed   Patient has one child   Patient has a college degree.    Caffeine: tea 1 cup   Social Determinants of Health   Financial Resource Strain: Not on file  Food Insecurity: Not on file  Transportation Needs: Not on file  Physical Activity: Not on file  Stress: Not on file  Social Connections: Not on file  Intimate Partner Violence: Not on file    Outpatient Medications Prior to Visit  Medication Sig Dispense Refill  . benzonatate (TESSALON) 100 MG capsule Take 1 capsule (100 mg total) by  mouth every 8 (eight) hours. 21 capsule 0  . diclofenac (VOLTAREN) 75 MG EC tablet Take 1 tablet (75 mg total) by mouth 2 (two) times daily. 60 tablet 3  . Fremanezumab-vfrm (AJOVY) 225 MG/1.5ML SOAJ INJECT 225 MG INTO THE SKIN EVERY 30 DAYS. 1 mL 5  . HYDROcodone-acetaminophen (NORCO) 10-325 MG tablet Take 1 tablet by mouth every 8 (eight) hours as needed. (Patient not taking: Reported on 04/12/2021) 15 tablet 0  . ondansetron (ZOFRAN-ODT) 4 MG disintegrating tablet DISSOLVE 1 TO 2 TABLETS IN MOUTH EVERY 8 HOURS AS NEEDED FOR NAUSEA OR DIZZINESS OR VERTIGO OR PRIOR TO EPISODES THAT MAY INCITE THESE. 30 tablet 0   No facility-administered medications prior to visit.    Allergies  Allergen Reactions  . Latex Other (See Comments)    Reaction: slight irritation    Review of Systems  Constitutional: Negative for chills, diaphoresis, fatigue and fever.  HENT: Negative for congestion, sinus pain and sore throat.   Respiratory: Positive for apnea. Negative for cough, chest tightness, shortness of breath and wheezing.   Cardiovascular: Negative for chest pain.  Genitourinary: Negative for difficulty urinating, dysuria and flank pain.       Objective:    Physical Exam Constitutional:      Appearance: Normal appearance.  HENT:     Head: Normocephalic and atraumatic.     Nose: No congestion.  Eyes:     Extraocular Movements: Extraocular movements intact.     Conjunctiva/sclera: Conjunctivae normal.     Pupils: Pupils are equal, round, and reactive to light.  Cardiovascular:     Rate and Rhythm: Normal rate and regular rhythm.     Pulses: Normal pulses.     Heart sounds: Normal heart sounds.  Pulmonary:     Effort: Pulmonary effort is normal. No respiratory distress.     Breath sounds: Normal breath sounds. No stridor. No wheezing or rhonchi.  Chest:     Chest wall: No tenderness.  Abdominal:     Tenderness: There is no right CVA tenderness or left CVA tenderness.  Neurological:      Mental Status: She is alert.     BP 123/67   Pulse 80   Temp 98 F (36.7 C) (Oral)   Ht 5\' 5"  (1.651 m)   Wt 143 lb (64.9 kg)   LMP 04/21/2021   SpO2 100%   BMI 23.80 kg/m  Wt Readings from Last 3 Encounters:  04/25/21 143 lb (64.9 kg)  01/24/21 142 lb (64.4 kg)  06/19/20 145 lb (65.8 kg)    Health Maintenance Due  Topic Date Due  . COVID-19 Vaccine (1) Never done  . Hepatitis C Screening  Never done    There are no preventive care  reminders to display for this patient.   Lab Results  Component Value Date   TSH 1.720 12/25/2019   Lab Results  Component Value Date   WBC 6.8 12/25/2019   HGB 14.4 12/25/2019   HCT 43.4 12/25/2019   MCV 86 12/25/2019   PLT 225 12/25/2019   Lab Results  Component Value Date   NA 141 12/25/2019   K 4.1 12/25/2019   CO2 25 12/25/2019   GLUCOSE 91 12/25/2019   BUN 11 12/25/2019   CREATININE 0.59 12/25/2019   BILITOT 0.3 12/25/2019   ALKPHOS 63 12/25/2019   AST 17 12/25/2019   ALT 6 12/25/2019   PROT 6.9 12/25/2019   ALBUMIN 4.6 12/25/2019   CALCIUM 9.3 12/25/2019   Lab Results  Component Value Date   CHOL 205 (H) 04/08/2018   Lab Results  Component Value Date   HDL 71 04/08/2018   Lab Results  Component Value Date   LDLCALC 119 (H) 04/08/2018   Lab Results  Component Value Date   TRIG 62 04/08/2018   Lab Results  Component Value Date   CHOLHDL 2.9 04/08/2018   Lab Results  Component Value Date   HGBA1C 5.1 06/02/2016       Assessment & Plan:  Marland KitchenMarland KitchenRalynn was seen today for breathing problem.  Diagnoses and all orders for this visit:  Mid back pain on right side -     DG Chest 2 View; Future  Pain aggravated by coughing and deep breathing -     DG Chest 2 View; Future   Chest X-ray ordered to look for pneumonia- post viral infection, focal pain with inspiration If X-ray negative, Patient can continue to use ibuprofen and icy-hot patches and massage for possible msk pain.   CXR normal.   .Harlon Flor PA-C, have reviewed and agree with the above documentation in it's entirety.

## 2021-04-25 NOTE — Progress Notes (Signed)
Dawn [name],   Lungs look completely clear. This is likely muscular or some cartilage inflamed in btw ribs. Icy hot and ibuprofen for a few days. Continue to take good deep breaths. Consider massage and stretching. Follow up with any new or worsening symptoms.

## 2021-04-28 NOTE — Progress Notes (Addendum)
PATIENT: Dawn [name] DOB: [birth year]  REASON FOR VISIT: follow up HISTORY FROM: patient  Virtual Visit via Telephone Note  I connected with Dawn [name] on 04/29/21 at  8:30 AM EDT by telephone and verified that I am speaking with the correct person using two identifiers.   I discussed the limitations, risks, security and privacy concerns of performing an evaluation and management service by telephone and the availability of in person appointments. I also discussed with the patient that there may be a patient responsible charge related to this service. The patient expressed understanding and agreed to proceed.   History of Present Illness:  04/29/21 ALL: Dawn [name] returns for follow up for vestibular migraines. She stopped Ajovy in 11/2020 for concerns of increased irritability. She reports that headaches have been well managed sine discontinuation. She has about 3 headache days per month. Ondansetron helps to abort headaches. Occasionally takes off brand Excedrin. She feels that she is doing.    04/24/2020 ALL:  Dawn [name] is a 40 y.o. female here today for follow up. She was started on Ajovy for concerns of vestibular migraines.  She reports that symptoms have improved since continuing Ajovy monthly.  She is uncertain if Zofran has helped but she has tolerated this medication when needed.  She has intermittent dizziness but feels that it has significantly improved.  Meclizine and physical therapy have not been helpful in the past.   History (copied from Dr Trevor Mace note on 12/25/2019)  HPI:  Dawn [name] is a 40 y.o. female here as requested by Monica Becton,* for vertigo.  I reviewed referring physician's notes.  Past medical history anemia with pregnancy, benign paroxysmal positional vertigo, depression, herniated cervical disc, hyperlipidemia, lumbar degenerative disc disease, myofascial pain syndrome with a negative cervical spine MRI,  negative brain MRI, nerve conduction and EMG and normal extensive blood work-up for peripheral neuropathy and myopathy, nontoxic multinodular goiter.  Patient has been breast-feeding, at appointment in November she complained of pain and pressure as well as nasal discharge, pressure behind the maxillary sinus with radiation to the teeth, mild cough, fatigue, low-grade fevers, myalgias.  Husband and daughter were having similar symptoms.  I have seen patient in the past but not for over 4 years.  In the past she had extensive imaging of the brain and the cervical spine, extensive lab work myopathy and paresthesias, and arthralgias, also EMG nerve conduction study at that time.  She was on Cymbalta and Lyrica when I saw her.  She had tried Flexeril in the past.  Extensive work-up in the past unremarkable.  She has been going to physical therapy, I reviewed notes, vestibular rehabilitation and dizziness, herniated cervical disc and vertigo and she has been working with physical therapy for this.  She last went to therapy December 07, 2019.  She also saw Brynda Peon in otolaryngology, for spells of dizziness and sometimes vertigo, no nausea or vomiting, feels her hearing is globally depressed but nonspecific to 1 year, no positional component, no change in tinnitus, she is to have headaches associated with it less so now, she has been diagnosed with vestibular migraines in the past.  I reviewed his notes, normal head and neck pain, no ear pathology, symptoms consistent with vestibular migraine.  She gave birth March 22nd this year.She had a boy, she is working from home and baby is in daycare. They also have a daughter, 17 years old. She is not breastfeeding. She breast fed for 6 months.  2-3 years ago she went for PT and the therapist mentioned vestibular migraine. She gets the dizziness with the headache and without the headache. She felt fine when she was pregnant. She has been to vestibular PT in the past and  now. She has tried to manage foods, has been under the care of her pcp and doing therapy. She has watched her food, drinking more fluids, it happens the most when she is driving, not positional, not orthostatic, she feels the cars screaming by and motion makes her feel dizzy. The constant peripheral stimulus makes her tired, feels dizzy, she drives at night and the lights bother her, her visual fields close in on her and she feels like she loses peripheral vision.Her myalgias and arthralgias (which she was extensively evaluated for in the past) went away. The headaches have been ongoing almost 3 years, no known family history of migraines, started with having her child, she has pain occipital pain, dry needling helped, headache is occipital, spreads to the ear and she has ear pain, pulsating and pounding and throbbing, her neck feels numb, her hands feel a lack of sensation. +light sensitivity, sleeping in a dark room helped, movement makes them worse, last 4-72 hours, she can wake with the headache and can be worse positionally and exertionally. She gets dizziness and sometimes frank vertigo with and without the headache. She has seen spots before headache. Also trouble swallowing. She is not having a lot of headaches and they are mild. Dizziness happens with driving mostly.  Reviewed notes, labs and imaging from outside physicians, which showed:  MRI brain 10/24/2019 showed No acute intracranial abnormalities including mass lesion or mass effect, hydrocephalus, extra-axial fluid collection, midline shift, hemorrhage, IAC abnormality, or acute infarction, large ischemic events (personally reviewed images)   Observations/Objective:  Generalized: Well developed, in no acute distress  Mentation: Alert oriented to time, place, history taking. Follows all commands speech and language fluent   Assessment and Plan:  40 y.o. year old female  has a past medical history of AMA (advanced maternal age)  multigravida 35+ (08/12/2018), Anemia, Benign paroxysmal positional vertigo (05/11/2017), Depression, Goiter, nontoxic, multinodular (10/19/2008), Herniated cervical disc (2016), Hip pain, Hyperlipidemia (03/15/2017), Lumbar degenerative disc disease (05/25/2016), Muscle spasm of back (12/13/2017), Myofascial pain syndrome (08/27/2014), Nontoxic multinodular goiter (10/19/2008), Overactive bladder (09/24/2014), Patellofemoral pain syndrome (06/04/2015), Right tennis elbow, Subconjunctival hematoma, left (09/14/2017), Supervision of other high risk pregnancy, antepartum (08/12/2018), Thyroid goiter, and Varicose vein of leg (06/04/2015). here with    ICD-10-CM   1. Vestibular migraine  G43.809      Tylyn reports that headaches have remained well managed off Ajovy. She has concerns of Ajovy causing hormonal changes with increased irritability. She may continue ondansetron for abortive therapy but was advised against regular use. May consider switching to [facility] or Qulipta in future if headaches worsen. She will follow-up closely with primary care.She will follow-up with [place] as needed, sooner if needed.  She verbalizes understanding and agreement with this plan.   No orders of the defined types were placed in this encounter.   No orders of the defined types were placed in this encounter.    Follow Up Instructions:  I discussed the assessment and treatment plan with the patient. The patient was provided an opportunity to ask questions and all were answered. The patient agreed with the plan and demonstrated an understanding of the instructions.   The patient was advised to call back or seek an in-person evaluation if the symptoms worsen  or if the condition fails to improve as anticipated.  I provided 15 minutes of non-face-to-face time during this encounter.  Patient located at her place of residence during my chart visit.  Provider is in the office.   Shawnie Dapper, NP    Agree with assessment and plan

## 2021-04-29 ENCOUNTER — Encounter: Payer: Self-pay | Admitting: Family Medicine

## 2021-04-29 ENCOUNTER — Telehealth (INDEPENDENT_AMBULATORY_CARE_PROVIDER_SITE_OTHER): Payer: Federal, State, Local not specified - PPO | Admitting: Family Medicine

## 2021-04-29 DIAGNOSIS — G43809 Other migraine, not intractable, without status migrainosus: Secondary | ICD-10-CM

## 2021-05-03 ENCOUNTER — Other Ambulatory Visit: Payer: Self-pay | Admitting: Neurology

## 2021-06-12 ENCOUNTER — Telehealth: Payer: Self-pay | Admitting: Family Medicine

## 2021-06-12 MED ORDER — ONDANSETRON HCL 4 MG PO TABS: 4.0000 mg | ORAL_TABLET | Freq: Three times a day (TID) | ORAL | 0 refills | Status: DC | PRN

## 2021-06-12 NOTE — Telephone Encounter (Signed)
Pt is asking for a call to discuss her refill request for Ondansetron 4 MG  to [id number], please call

## 2021-06-12 NOTE — Addendum Note (Signed)
Addended byNicholas Lose, Maryam Feely L on: 06/12/2021 02:00 PM   Modules accepted: Orders

## 2021-08-23 DIAGNOSIS — J019 Acute sinusitis, unspecified: Secondary | ICD-10-CM | POA: Diagnosis not present

## 2021-08-23 DIAGNOSIS — Z20822 Contact with and (suspected) exposure to covid-19: Secondary | ICD-10-CM | POA: Diagnosis not present

## 2021-08-23 DIAGNOSIS — J029 Acute pharyngitis, unspecified: Secondary | ICD-10-CM | POA: Diagnosis not present

## 2021-08-23 DIAGNOSIS — B9689 Other specified bacterial agents as the cause of diseases classified elsewhere: Secondary | ICD-10-CM | POA: Diagnosis not present

## 2021-10-04 ENCOUNTER — Other Ambulatory Visit: Payer: Self-pay | Admitting: Family Medicine

## 2021-10-07 MED ORDER — ONDANSETRON HCL 4 MG PO TABS: 4.0000 mg | ORAL_TABLET | Freq: Three times a day (TID) | ORAL | 0 refills | Status: DC | PRN

## 2022-01-04 ENCOUNTER — Other Ambulatory Visit: Payer: Self-pay | Admitting: Neurology

## 2022-01-05 MED ORDER — ONDANSETRON HCL 4 MG PO TABS: 4.0000 mg | ORAL_TABLET | Freq: Three times a day (TID) | ORAL | 0 refills | Status: DC | PRN

## 2022-01-20 ENCOUNTER — Encounter: Payer: Self-pay | Admitting: Sports Medicine

## 2022-01-23 ENCOUNTER — Ambulatory Visit (INDEPENDENT_AMBULATORY_CARE_PROVIDER_SITE_OTHER): Payer: Federal, State, Local not specified - PPO

## 2022-01-23 ENCOUNTER — Other Ambulatory Visit: Payer: Self-pay

## 2022-01-23 ENCOUNTER — Ambulatory Visit: Payer: Federal, State, Local not specified - PPO | Admitting: Sports Medicine

## 2022-01-23 VITALS — Ht 65.0 in | Wt 140.0 lb

## 2022-01-23 DIAGNOSIS — Z23 Encounter for immunization: Secondary | ICD-10-CM

## 2022-01-23 DIAGNOSIS — M533 Sacrococcygeal disorders, not elsewhere classified: Secondary | ICD-10-CM

## 2022-01-23 NOTE — Assessment & Plan Note (Signed)
This is a pleasant 41 year old female, for the past couple months she had pain deep in the coccyx, often worse with sitting, sometimes standing, also having some low back pain with Valsalva. Pain is pretty good today, we discussed the anatomy and pathophysiology, and we will start with conservative treatment including over-the-counter NSAIDs as needed, donut pillow for at least the next 6 weeks. I would like to sacrococcygeal x-rays today, if insufficient improvement after 6 weeks we will consider sacrococcygeal MRI, if MRI is unrevealing we will proceed with sacrococcygeal intradiscal injections. We did discuss coccyx excision as an end of the road treatment.

## 2022-01-23 NOTE — Progress Notes (Signed)
° ° °  Procedures performed today:    None.  Independent interpretation of notes and tests performed by another provider:   None.  Brief History, Exam, Impression, and Recommendations:    Coccydynia This is a pleasant 41 year old female, for the past couple months she had pain deep in the coccyx, often worse with sitting, sometimes standing, also having some low back pain with Valsalva. Pain is pretty good today, we discussed the anatomy and pathophysiology, and we will start with conservative treatment including over-the-counter NSAIDs as needed, donut pillow for at least the next 6 weeks. I would like to sacrococcygeal x-rays today, if insufficient improvement after 6 weeks we will consider sacrococcygeal MRI, if MRI is unrevealing we will proceed with sacrococcygeal intradiscal injections. We did discuss coccyx excision as an end of the road treatment.    ___________________________________________ Ihor Austin. Benjamin Stain, M.D., ABFM., CAQSM. Primary Care and Sports Medicine [facility]  Adjunct Instructor of Family Medicine  [employer]

## 2022-01-23 NOTE — Addendum Note (Signed)
Addended by: Gust Brooms on: 01/23/2022 02:21 PM   Modules accepted: Orders

## 2022-02-24 ENCOUNTER — Encounter: Payer: Self-pay | Admitting: Sports Medicine

## 2022-02-24 ENCOUNTER — Other Ambulatory Visit: Payer: Self-pay

## 2022-02-24 ENCOUNTER — Ambulatory Visit: Payer: Federal, State, Local not specified - PPO | Admitting: Sports Medicine

## 2022-02-24 DIAGNOSIS — M7918 Myalgia, other site: Secondary | ICD-10-CM | POA: Diagnosis not present

## 2022-02-24 DIAGNOSIS — R200 Anesthesia of skin: Secondary | ICD-10-CM

## 2022-02-24 DIAGNOSIS — R202 Paresthesia of skin: Secondary | ICD-10-CM | POA: Diagnosis not present

## 2022-02-24 MED ORDER — MELOXICAM 15 MG PO TABS: ORAL_TABLET | ORAL | 3 refills | Status: DC

## 2022-02-24 NOTE — Progress Notes (Signed)
? ? ?  Procedures performed today:   ? ?None. ? ?Independent interpretation of notes and tests performed by another provider:  ? ?None. ? ?Brief History, Exam, Impression, and Recommendations:   ? ?Numbness and tingling in right hand ?Pleasant 41 year old female, increasing nocturnal numbness and tingling right hand, spares the pinky. ?Negative Tinel's, mildly positive Phalen sign. ?Suspect carpal tunnel syndrome, no thenar atrophy. ?We discussed the pathophysiology, she will do nighttime splinting, she does have a splint at home, and carpal tunnel conditioning, meloxicam. ?Return to see me in 6 weeks. ?We will consider median nerve hydrodissection if not better. ? ?Myofascial pain syndrome ?Anureet also has some pain in her neck, occasional radiation down both arms to the fingertips. ?Back in 2015 we did an extensive work-up that included a negative cervical spine MRI, negative brain MRI, as well as a negative nerve conduction, EMG studies, and normal extensive blood work for peripheral neuropathy and myopathy. ?We did place her on Lyrica, she is no longer on this. ?I think we should restart conservative treatment in the form of cervical spondylosis condition, adding meloxicam, return to see me in 6 weeks, imaging if no better. ? ?Chronic process with exacerbation and pharmacologic intervention ? ?___________________________________________ ?Ihor Austin. Benjamin Stain, M.D., ABFM., CAQSM. ?Primary Care and Sports Medicine ?[facility] ? ?Adjunct Instructor of Family Medicine  ?[employer] ?

## 2022-02-24 NOTE — Assessment & Plan Note (Signed)
Pleasant 41 year old female, increasing nocturnal numbness and tingling right hand, spares the pinky. ?Negative Tinel's, mildly positive Phalen sign. ?Suspect carpal tunnel syndrome, no thenar atrophy. ?We discussed the pathophysiology, she will do nighttime splinting, she does have a splint at home, and carpal tunnel conditioning, meloxicam. ?Return to see me in 6 weeks. ?We will consider median nerve hydrodissection if not better. ?

## 2022-02-24 NOTE — Assessment & Plan Note (Signed)
Dawn [name] also has some pain in her neck, occasional radiation down both arms to the fingertips. ?Back in 2015 we did an extensive work-up that included a negative cervical spine MRI, negative brain MRI, as well as a negative nerve conduction, EMG studies, and normal extensive blood work for peripheral neuropathy and myopathy. ?We did place her on Lyrica, she is no longer on this. ?I think we should restart conservative treatment in the form of cervical spondylosis condition, adding meloxicam, return to see me in 6 weeks, imaging if no better. ?

## 2022-03-02 ENCOUNTER — Encounter: Payer: Self-pay | Admitting: Sports Medicine

## 2022-03-05 ENCOUNTER — Ambulatory Visit: Payer: Federal, State, Local not specified - PPO | Admitting: Sports Medicine

## 2022-04-08 ENCOUNTER — Ambulatory Visit: Payer: Federal, State, Local not specified - PPO | Admitting: Sports Medicine

## 2022-04-08 DIAGNOSIS — R200 Anesthesia of skin: Secondary | ICD-10-CM

## 2022-04-08 DIAGNOSIS — R202 Paresthesia of skin: Secondary | ICD-10-CM | POA: Diagnosis not present

## 2022-04-08 DIAGNOSIS — M533 Sacrococcygeal disorders, not elsewhere classified: Secondary | ICD-10-CM | POA: Diagnosis not present

## 2022-04-08 DIAGNOSIS — M51369 Other intervertebral disc degeneration, lumbar region without mention of lumbar back pain or lower extremity pain: Secondary | ICD-10-CM

## 2022-04-08 DIAGNOSIS — M5136 Other intervertebral disc degeneration, lumbar region: Secondary | ICD-10-CM | POA: Diagnosis not present

## 2022-04-08 NOTE — Assessment & Plan Note (Addendum)
This is a pleasant 41 year old female, she is a long history of axial low back pain, worse with sitting, flexion, Valsalva, we have been treating her since 2017 on and off, she has had multiple visits including physician directed conservative treatments, medications, neuropathic agents. ?Pain is axial, discogenic without a radicular component.  Some of the pain radiates to the tailbone. ?She had a visit in February for this, she was reevaluated today, we are going to proceed with MRI for epidural planning. ?X-rays were done in February. ?Once I see the MRI she is okay with me just ordering the epidural and having her follow-up with me a month afterwards. ?

## 2022-04-08 NOTE — Assessment & Plan Note (Signed)
Please see below, I do think the majority of her tailbone pain is referred from her L4-L5 level. ?

## 2022-04-08 NOTE — Assessment & Plan Note (Signed)
Resolved with nighttime splinting, meloxicam, home conditioning. ?

## 2022-04-08 NOTE — Progress Notes (Signed)
? ? ?  Procedures performed today:   ? ?None. ? ?Independent interpretation of notes and tests performed by another provider:  ? ?Lumbar spine x-ray personally reviewed, she does have a normal-appearing coccyx, she has moderate to severe L4-L5 loss of disc height with anterior osteophytes. ? ?Brief History, Exam, Impression, and Recommendations:   ? ?Lumbar degenerative disc disease ?This is a pleasant 41 year old female, she is a long history of axial low back pain, worse with sitting, flexion, Valsalva, we have been treating her since 2017 on and off, she has had multiple visits including physician directed conservative treatments, medications, neuropathic agents. ?Pain is axial, discogenic without a radicular component.  Some of the pain radiates to the tailbone. ?She had a visit in February for this, she was reevaluated today, we are going to proceed with MRI for epidural planning. ?X-rays were done in February. ?Once I see the MRI she is okay with me just ordering the epidural and having her follow-up with me a month afterwards. ? ?Coccydynia ?Please see below, I do think the majority of her tailbone pain is referred from her L4-L5 level. ? ?Numbness and tingling in right hand ?Resolved with nighttime splinting, meloxicam, home conditioning. ? ? ? ?___________________________________________ ?Ihor Austin. Benjamin Stain, M.D., ABFM., CAQSM. ?Primary Care and Sports Medicine ?[facility] ? ?Adjunct Instructor of Family Medicine  ?[employer] ?

## 2022-04-11 ENCOUNTER — Ambulatory Visit (INDEPENDENT_AMBULATORY_CARE_PROVIDER_SITE_OTHER): Payer: Federal, State, Local not specified - PPO

## 2022-04-11 DIAGNOSIS — M545 Low back pain, unspecified: Secondary | ICD-10-CM | POA: Diagnosis not present

## 2022-04-11 DIAGNOSIS — M5136 Other intervertebral disc degeneration, lumbar region: Secondary | ICD-10-CM | POA: Diagnosis not present

## 2022-04-23 ENCOUNTER — Ambulatory Visit
Admission: RE | Admit: 2022-04-23 | Discharge: 2022-04-23 | Disposition: A | Discharge status: Home / Self Care | Payer: Federal, State, Local not specified - PPO | Source: Ambulatory Visit | Attending: Sports Medicine | Admitting: Sports Medicine

## 2022-04-23 DIAGNOSIS — M5136 Other intervertebral disc degeneration, lumbar region: Secondary | ICD-10-CM | POA: Diagnosis not present

## 2022-04-23 MED ORDER — METHYLPREDNISOLONE ACETATE 40 MG/ML INJ SUSP (RADIOLOG
80.0000 mg | Freq: Once | INTRAMUSCULAR | Status: AC
  Administered 2022-04-23: 80 mg via EPIDURAL

## 2022-04-23 MED ORDER — IOPAMIDOL (ISOVUE-M 200) INJECTION 41%
1.0000 mL | Freq: Once | INTRAMUSCULAR | Status: AC
  Administered 2022-04-23: 1 mL via EPIDURAL

## 2022-04-23 NOTE — Discharge Instructions (Signed)

## 2022-05-27 ENCOUNTER — Encounter: Payer: Self-pay | Admitting: Sports Medicine

## 2022-06-01 ENCOUNTER — Ambulatory Visit: Payer: Federal, State, Local not specified - PPO | Admitting: Sports Medicine

## 2022-06-01 ENCOUNTER — Ambulatory Visit (INDEPENDENT_AMBULATORY_CARE_PROVIDER_SITE_OTHER): Payer: Federal, State, Local not specified - PPO

## 2022-06-01 DIAGNOSIS — M533 Sacrococcygeal disorders, not elsewhere classified: Secondary | ICD-10-CM

## 2022-06-01 NOTE — Progress Notes (Signed)
    Procedures performed today:    Procedure: Real-time Ultrasound Guided injection of the intercoccygeal segment injection Device: Samsung HS60  Verbal informed consent obtained.  Time-out conducted.  Noted no overlying erythema, induration, or other signs of local infection.  Skin prepped in a sterile fashion.  Local anesthesia: Topical Ethyl chloride.  With sterile technique and under real time ultrasound guidance: Localized pain to the first intercoccygeal segment, there was more of a gap in the segment compared to cranial or caudal, I injected 1 cc lidocaine, 1 cc kenalog 40 into this region with a 22-gauge needle. Completed without difficulty  Advised to call if fevers/chills, erythema, induration, drainage, or persistent bleeding.  Images permanently stored and available for review in PACS.  Impression: Technically successful ultrasound guided injection.  Independent interpretation of notes and tests performed by another provider:   None.  Brief History, Exam, Impression, and Recommendations:    Coccydynia Dawn [name] returns, very pleasant 41 year old female, chronic axial low back pain, initially suspected to be referred from the L4-L5 level, we tried conservative treatment, neuropathics, ultimately an MRI showed L4-L5 DDD, L4-L5 epidural did not provide much relief at all. She does refer her pain more to the sacrococcygeal junction versus the intercoccygeal segments, so today we did an intercoccygeal segment injection. She had concordant pain during the injection, return to see me in 6 weeks.    ___________________________________________ Ihor Austin. Benjamin Stain, M.D., ABFM., CAQSM. Primary Care and Sports Medicine [facility]  Adjunct Instructor of Family Medicine  [employer]

## 2022-06-01 NOTE — Assessment & Plan Note (Signed)
Dawn [name] returns, very pleasant 41 year old female, chronic axial low back pain, initially suspected to be referred from the L4-L5 level, we tried conservative treatment, neuropathics, ultimately an MRI showed L4-L5 DDD, L4-L5 epidural did not provide much relief at all. She does refer her pain more to the sacrococcygeal junction versus the intercoccygeal segments, so today we did an intercoccygeal segment injection. She had concordant pain during the injection, return to see me in 6 weeks.

## 2022-06-10 ENCOUNTER — Ambulatory Visit: Payer: Federal, State, Local not specified - PPO | Admitting: Physician Assistant

## 2022-06-15 ENCOUNTER — Encounter: Payer: Self-pay | Admitting: Sports Medicine

## 2022-07-20 ENCOUNTER — Ambulatory Visit: Payer: Federal, State, Local not specified - PPO | Admitting: Sports Medicine

## 2022-07-20 DIAGNOSIS — M47816 Spondylosis without myelopathy or radiculopathy, lumbar region: Secondary | ICD-10-CM | POA: Diagnosis not present

## 2022-07-20 DIAGNOSIS — M533 Sacrococcygeal disorders, not elsewhere classified: Secondary | ICD-10-CM | POA: Diagnosis not present

## 2022-07-20 NOTE — Progress Notes (Signed)
    Procedures performed today:    None.  Independent interpretation of notes and tests performed by another provider:   I again personally reviewed the lumbar spine MRI, she has an L4-L5 disc protrusion, mild with desiccation. There is also L4-L5 bilateral facet arthrosis.  Brief History, Exam, Impression, and Recommendations:    Lumbar spondylosis This is a very pleasant 41 year old female, we have been treating her for a long history of axial low back pain worse with sitting, flexion, Valsalva. She more recently had an L4-L5 epidural which did not provide significant relief, she had some coccydynia which responded to a coccygeal disc injection. Still has lumbar axial pain, worse with Valsalva. The only other finding on her MRI was L4-L5 facet arthritis bilaterally, we will target the facets next though I do think the majority of her pain is coming from the disc. If she does not get good relief from her facet injections we will refer to neurosurgery for further discussion, if she does get good relief we can consider medial branch blocks and radiofrequency ablation.  Coccydynia Resolved after intercoccygeal segment injection at the last visit.    ____________________________________________ Ihor Austin. Benjamin Stain, M.D., ABFM., CAQSM., AME. Primary Care and Sports Medicine [facility]  Adjunct Professor of Family Medicine  [employer] [employer]  Restaurant manager, fast food

## 2022-07-20 NOTE — Assessment & Plan Note (Signed)
This is a very pleasant 41 year old female, we have been treating her for a long history of axial low back pain worse with sitting, flexion, Valsalva. She more recently had an L4-L5 epidural which did not provide significant relief, she had some coccydynia which responded to a coccygeal disc injection. Still has lumbar axial pain, worse with Valsalva. The only other finding on her MRI was L4-L5 facet arthritis bilaterally, we will target the facets next though I do think the majority of her pain is coming from the disc. If she does not get good relief from her facet injections we will refer to neurosurgery for further discussion, if she does get good relief we can consider medial branch blocks and radiofrequency ablation.

## 2022-07-20 NOTE — Assessment & Plan Note (Signed)
Resolved after intercoccygeal segment injection at the last visit.

## 2022-07-27 ENCOUNTER — Ambulatory Visit
Admission: RE | Admit: 2022-07-27 | Discharge: 2022-07-27 | Disposition: A | Discharge status: Home / Self Care | Payer: Federal, State, Local not specified - PPO | Source: Ambulatory Visit | Attending: Sports Medicine | Admitting: Sports Medicine

## 2022-07-27 DIAGNOSIS — M47816 Spondylosis without myelopathy or radiculopathy, lumbar region: Secondary | ICD-10-CM

## 2022-07-27 DIAGNOSIS — M545 Low back pain, unspecified: Secondary | ICD-10-CM | POA: Diagnosis not present

## 2022-07-27 MED ORDER — IOPAMIDOL (ISOVUE-M 200) INJECTION 41%
1.0000 mL | Freq: Once | INTRAMUSCULAR | Status: AC
  Administered 2022-07-27: 1 mL via INTRA_ARTICULAR

## 2022-07-27 MED ORDER — METHYLPREDNISOLONE ACETATE 40 MG/ML INJ SUSP (RADIOLOG
80.0000 mg | Freq: Once | INTRAMUSCULAR | Status: AC
  Administered 2022-07-27: 80 mg via INTRA_ARTICULAR

## 2022-07-27 NOTE — Discharge Instructions (Signed)

## 2022-08-31 ENCOUNTER — Other Ambulatory Visit: Payer: Self-pay | Admitting: Sports Medicine

## 2022-08-31 ENCOUNTER — Ambulatory Visit (INDEPENDENT_AMBULATORY_CARE_PROVIDER_SITE_OTHER): Payer: Federal, State, Local not specified - PPO | Admitting: Obstetrics & Gynecology

## 2022-08-31 ENCOUNTER — Encounter: Payer: Self-pay | Admitting: Obstetrics & Gynecology

## 2022-08-31 VITALS — BP 120/77 | HR 82 | Resp 16 | Ht 65.0 in | Wt 134.0 lb

## 2022-08-31 DIAGNOSIS — Z23 Encounter for immunization: Secondary | ICD-10-CM | POA: Diagnosis not present

## 2022-08-31 DIAGNOSIS — Z1231 Encounter for screening mammogram for malignant neoplasm of breast: Secondary | ICD-10-CM

## 2022-08-31 DIAGNOSIS — Z01419 Encounter for gynecological examination (general) (routine) without abnormal findings: Secondary | ICD-10-CM

## 2022-08-31 DIAGNOSIS — N3946 Mixed incontinence: Secondary | ICD-10-CM | POA: Diagnosis not present

## 2022-08-31 NOTE — Progress Notes (Signed)
Last pap -2/22 Last mammo-09/10/22 Sunscreen-yes Seatbelts-yes Dentist-yes Brush and floss-yes

## 2022-08-31 NOTE — Progress Notes (Signed)
Subjective:     Dawn [name] is a 41 y.o. female here for a routine exam.  Current complaints: feels like bladder is not emptying; urgency at times, leakage with coughing and sneezing.  She has to void twice with each bathroom to make her bladder feel empty.   Gynecologic History Patient's last menstrual period was 07/25/2022. Contraception: vasectomy Last Pap: 2022. Results were: normal Last mammogram: 2022. Results were: normal  Obstetric History OB History  Gravida Para Term Preterm AB Living  3 2 2   1 2   SAB IAB Ectopic Multiple Live Births  1     0 2    # Outcome Date GA Lbr Len/2nd Weight Sex Delivery Anes PTL Lv  3 Term 03/05/19 [redacted]w[redacted]d  8 lb 1.1 oz (3.66 kg) M Vag-Spont Local  LIV  2 Term 07/25/11 [redacted]w[redacted]d 06:19 / 01:14 6 lb 15 oz (3.147 kg) F Vag-Spont None  LIV     Birth Comments: wnl  1 SAB              The following portions of the patient's history were reviewed and updated as appropriate: allergies, current medications, past family history, past medical history, past social history, past surgical history, and problem list.  Review of Systems Pertinent items noted in HPI and remainder of comprehensive ROS otherwise negative.    Objective:     Vitals:   08/31/22 1423  BP: 120/77  Pulse: 82  Resp: 16  Weight: 134 lb (60.8 kg)  Height: 5\' 5"  (1.651 m)   Vitals:  WNL General appearance: alert, cooperative and no distress  HEENT: Normocephalic, without obvious abnormality, atraumatic Eyes: negative Throat: lips, mucosa, and tongue normal; teeth and gums normal  Respiratory: Clear to auscultation bilaterally  CV: Regular rate and rhythm  Breasts:  Normal appearance, no masses or tenderness, no nipple retraction or dimpling  GI: Soft, non-tender; bowel sounds normal; no masses,  no organomegaly  GU: External Genitalia:  Tanner V, no lesion Urethra:  No prolapse   Vagina: Pink, normal rugae, no blood or discharge  Cervix: No CMT, no lesion  Uterus:   Normal size and contour, non tender  Adnexa: Normal, no masses, non tender  Musculoskeletal: No edema, redness or tenderness in the calves or thighs  Skin: No lesions or rash  Lymphatic: Axillary adenopathy: none     Psychiatric: Normal mood and behavior        Assessment:    Healthy female exam.    Plan:    Pap up to date Colon cancer screening 45 Yearly mammogram PRV is <10 cc Refer to uro gyn (pt prefers [facility] due to location convenience

## 2022-09-03 NOTE — Addendum Note (Signed)
Addended by: Asencion Islam on: 09/03/2022 09:27 AM   Modules accepted: Orders

## 2022-09-10 ENCOUNTER — Ambulatory Visit (INDEPENDENT_AMBULATORY_CARE_PROVIDER_SITE_OTHER): Payer: Federal, State, Local not specified - PPO

## 2022-09-10 DIAGNOSIS — Z1231 Encounter for screening mammogram for malignant neoplasm of breast: Secondary | ICD-10-CM

## 2022-12-08 DIAGNOSIS — N3946 Mixed incontinence: Secondary | ICD-10-CM | POA: Diagnosis not present

## 2023-02-22 ENCOUNTER — Encounter: Payer: Self-pay | Admitting: Sports Medicine

## 2023-03-15 ENCOUNTER — Encounter: Payer: Self-pay | Admitting: Neurology

## 2023-03-16 ENCOUNTER — Encounter: Payer: Self-pay | Admitting: Sports Medicine

## 2023-03-16 ENCOUNTER — Ambulatory Visit: Payer: Federal, State, Local not specified - PPO | Admitting: Sports Medicine

## 2023-03-16 VITALS — BP 135/92 | HR 93

## 2023-03-16 DIAGNOSIS — M47816 Spondylosis without myelopathy or radiculopathy, lumbar region: Secondary | ICD-10-CM

## 2023-03-16 MED ORDER — ONDANSETRON HCL 4 MG PO TABS: 4.0000 mg | ORAL_TABLET | Freq: Three times a day (TID) | ORAL | 0 refills | Status: DC | PRN

## 2023-03-16 NOTE — Assessment & Plan Note (Signed)
Dawn [name] returns, she is a very pleasant 42 year old female, she has a long history of axial low back pain worse with sitting, flexion, Valsalva, she did have an L4-L5 epidural sometime last year which did not provide significant relief. She also had some coccydynia which responded to a coccygeal disc injection here in the office. The only finding on MRI was L4-L5 facet arthritis, we targeted her facets and she also had no relief, not even temporary. Today she is having recurrence of pain, bilateral paralumbar, I did number for trigger point injections today. Return to see me in 6 weeks, advanced herniated disc conditioning given, I do suspect if she still has discomfort we will need to get additional advanced imaging. She also still has coccydynia, slightly worsening so this can be a target for injection in 6 weeks as well if needed.

## 2023-03-16 NOTE — Addendum Note (Signed)
Addended by: Debbora Presto L on: 03/16/2023 07:44 AM   Modules accepted: Orders

## 2023-03-16 NOTE — Progress Notes (Signed)
    Procedures performed today:    Procedure:  Injection of #4 paralumbar trigger point Consent obtained and verified. Time-out conducted. Noted no overlying erythema, induration, or other signs of local infection. Skin prepped in a sterile fashion. Topical analgesic spray: Ethyl chloride. Completed without difficulty. Meds: 1 cc kenalog 40, 2 cc lidocaine, 2 cc bupivacaine injected easily into the 4 trigger points Advised to call if fevers/chills, erythema, induration, drainage, or persistent bleeding.  Independent interpretation of notes and tests performed by another provider:   None.  Brief History, Exam, Impression, and Recommendations:    Lumbar spondylosis Dawn [name] returns, she is a very pleasant 42 year old female, she has a long history of axial low back pain worse with sitting, flexion, Valsalva, she did have an L4-L5 epidural sometime last year which did not provide significant relief. She also had some coccydynia which responded to a coccygeal disc injection here in the office. The only finding on MRI was L4-L5 facet arthritis, we targeted her facets and she also had no relief, not even temporary. Today she is having recurrence of pain, bilateral paralumbar, I did number for trigger point injections today. Return to see me in 6 weeks, advanced herniated disc conditioning given, I do suspect if she still has discomfort we will need to get additional advanced imaging. She also still has coccydynia, slightly worsening so this can be a target for injection in 6 weeks as well if needed.    ____________________________________________ Gwen Her. Dianah Field, M.D., ABFM., CAQSM., AME. Primary Care and Sports Medicine [facility]  Adjunct Professor of [employer]  Risk manager

## 2023-04-08 ENCOUNTER — Encounter: Payer: Self-pay | Admitting: Sports Medicine

## 2023-04-08 DIAGNOSIS — M47816 Spondylosis without myelopathy or radiculopathy, lumbar region: Secondary | ICD-10-CM

## 2023-04-14 ENCOUNTER — Telehealth: Payer: Self-pay | Admitting: Sports Medicine

## 2023-04-14 NOTE — Telephone Encounter (Signed)
Patient called to inquire about injections for her back she wanted to follow up could you please contact patient thank you

## 2023-04-15 NOTE — Telephone Encounter (Signed)
Left a message with [facility] to schedule patient as soon as possible.

## 2023-04-15 NOTE — Telephone Encounter (Signed)
I placed the order for the transforaminal epidural several days ago, please call [facility] to get her on the schedule if they have not contacted her yet.

## 2023-04-20 ENCOUNTER — Other Ambulatory Visit: Payer: Self-pay | Admitting: Sports Medicine

## 2023-04-20 DIAGNOSIS — M47816 Spondylosis without myelopathy or radiculopathy, lumbar region: Secondary | ICD-10-CM

## 2023-04-27 ENCOUNTER — Ambulatory Visit
Admission: RE | Admit: 2023-04-27 | Discharge: 2023-04-27 | Disposition: A | Discharge status: Home / Self Care | Payer: Federal, State, Local not specified - PPO | Source: Ambulatory Visit | Attending: Sports Medicine | Admitting: Sports Medicine

## 2023-04-27 DIAGNOSIS — M47816 Spondylosis without myelopathy or radiculopathy, lumbar region: Secondary | ICD-10-CM

## 2023-04-27 DIAGNOSIS — M545 Low back pain, unspecified: Secondary | ICD-10-CM | POA: Diagnosis not present

## 2023-04-27 MED ORDER — METHYLPREDNISOLONE ACETATE 40 MG/ML INJ SUSP (RADIOLOG
80.0000 mg | Freq: Once | INTRAMUSCULAR | Status: AC
  Administered 2023-04-27: 80 mg via EPIDURAL

## 2023-04-27 MED ORDER — IOPAMIDOL (ISOVUE-M 200) INJECTION 41%
1.0000 mL | Freq: Once | INTRAMUSCULAR | Status: AC
  Administered 2023-04-27: 1 mL via EPIDURAL

## 2023-04-27 NOTE — Discharge Instructions (Signed)

## 2023-06-02 ENCOUNTER — Telehealth: Payer: Federal, State, Local not specified - PPO | Admitting: Neurology

## 2023-06-02 ENCOUNTER — Telehealth: Payer: Self-pay | Admitting: Neurology

## 2023-06-02 ENCOUNTER — Encounter: Payer: Self-pay | Admitting: Neurology

## 2023-06-02 DIAGNOSIS — G43109 Migraine with aura, not intractable, without status migrainosus: Secondary | ICD-10-CM | POA: Diagnosis not present

## 2023-06-02 DIAGNOSIS — G43009 Migraine without aura, not intractable, without status migrainosus: Secondary | ICD-10-CM | POA: Diagnosis not present

## 2023-06-02 MED ORDER — RIZATRIPTAN BENZOATE 10 MG PO TBDP: 10.0000 mg | ORAL_TABLET | ORAL | 11 refills | Status: AC | PRN

## 2023-06-02 MED ORDER — QULIPTA 60 MG PO TABS: 60.0000 mg | ORAL_TABLET | Freq: Every day | ORAL | 11 refills | Status: DC

## 2023-06-02 NOTE — Progress Notes (Signed)
PATIENT: Dawn [name] DOB: 11/01/1981  REASON FOR VISIT: vestibular migraines HISTORY FROM: patient  Virtual Visit via Video Note  I connected with Dawn [name] on 06/02/23 at  8:30 AM EDT by a video enabled telemedicine application and verified that I am speaking with the correct person using two identifiers.  Location: Patient: home Provider: office   I discussed the limitations of evaluation and management by telemedicine and the availability of in person appointments. The patient expressed understanding and agreed to proceed.  Follow Up Instructions:    I discussed the assessment and treatment plan with the patient. The patient was provided an opportunity to ask questions and all were answered. The patient agreed with the plan and demonstrated an understanding of the instructions.   The patient was advised to call back or seek an in-person evaluation if the symptoms worsen or if the condition fails to improve as anticipated.  I provided 30 minutes of non-face-to-face time during this encounter.   Anson Fret, MD   History of Present Illness:  06/02/2023:has Family history of breast cancer; Myofascial pain syndrome; Goiter, nontoxic, multinodular; Overactive bladder; Varicose vein of leg; Patellofemoral pain syndrome; Lumbar spondylosis; Hyperlipidemia; Vertigo; Subconjunctival hematoma, left; Elevated PTHrP level; Viral syndrome; Vestibular migraine; Chronic migraine without aura without status migrainosus, not intractable; Migraine aura without headache; Migraine with aura and without status migrainosus, not intractable; Lateral epicondylitis, right elbow; Mid back pain on right side; Pain aggravated by coughing and deep breathing; Coccydynia; and Numbness and tingling in right hand on their problem list.   Her migraines are back. First seen for migraines in 12/2019. After the birth of child in 2022 the migraines improved. We started her on Ajovy and she  did well and went off of it and just having 3 migraines a month and just using acute medication. The last migraine did not respond t excedrin and zofran. Starting to become more frequent. More vertigo. Feeling worse, more frequent. Nausea and "swimminess", brain is "wooshy" and head pain, nausea, no neck pain, throbbing/pulsating on the right hand side behind the eye, photophobia, lights low help, not positional or exertional, no vision changes. The same quality as before. Can last a whole day and be moderate to severe. No medication overuse. She has 6 migraine days a month and < 10 total headache days a month > 6 months. We discussed options, she doesn't want an injectable.  Medications tried > 2 months: Topiramate, celexa, flexeril, cymbalta, ajovy,ketorolac, lyrica, meclizine.mobic, BP medications contraindicated due to hypotension, imitrex, ajovy, aimovig contraindicated due to constipation  No other focal neurologic deficits, associated symptoms, inciting events or modifiable factors.  Patient complains of symptoms per HPI as well as the following symptoms: migraine . Pertinent negatives and positives per HPI. All others negative   04/29/21 ALL: Dawn [name] returns for follow up for vestibular migraines. She stopped Ajovy in 11/2020 for concerns of increased irritability. She reports that headaches have been well managed sine discontinuation. She has about 3 headache days per month. Ondansetron helps to abort headaches. Occasionally takes off brand Excedrin. She feels that she is doing.    04/24/2020 ALL:  Dawn [name] is a 42 y.o. female here today for follow up. She was started on Ajovy for concerns of vestibular migraines.  She reports that symptoms have improved since continuing Ajovy monthly.  She is uncertain if Zofran has helped but she has tolerated this medication when needed.  She has intermittent dizziness but feels that it has  significantly improved.  Meclizine and physical therapy  have not been helpful in the past.   History (copied from Dr Trevor Mace note on 12/25/2019)  HPI:  Dawn [name] is a 42 y.o. female here as requested by Monica Becton,* for vertigo.  I reviewed referring physician's notes.  Past medical history anemia with pregnancy, benign paroxysmal positional vertigo, depression, herniated cervical disc, hyperlipidemia, lumbar degenerative disc disease, myofascial pain syndrome with a negative cervical spine MRI, negative brain MRI, nerve conduction and EMG and normal extensive blood work-up for peripheral neuropathy and myopathy, nontoxic multinodular goiter.  Patient has been breast-feeding, at appointment in November she complained of pain and pressure as well as nasal discharge, pressure behind the maxillary sinus with radiation to the teeth, mild cough, fatigue, low-grade fevers, myalgias.  Husband and daughter were having similar symptoms.  I have seen patient in the past but not for over 4 years.  In the past she had extensive imaging of the brain and the cervical spine, extensive lab work myopathy and paresthesias, and arthralgias, also EMG nerve conduction study at that time.  She was on Cymbalta and Lyrica when I saw her.  She had tried Flexeril in the past.  Extensive work-up in the past unremarkable.  She has been going to physical therapy, I reviewed notes, vestibular rehabilitation and dizziness, herniated cervical disc and vertigo and she has been working with physical therapy for this.  She last went to therapy December 07, 2019.  She also saw Brynda Peon in otolaryngology, for spells of dizziness and sometimes vertigo, no nausea or vomiting, feels her hearing is globally depressed but nonspecific to 1 year, no positional component, no change in tinnitus, she is to have headaches associated with it less so now, she has been diagnosed with vestibular migraines in the past.  I reviewed his notes, normal head and neck pain, no ear pathology,  symptoms consistent with vestibular migraine.   She gave birth March 22nd this year.She had a boy, she is working from home and baby is in daycare. They also have a daughter, 49 years old. She is not breastfeeding. She breast fed for 6 months. 2-3 years ago she went for PT and the therapist mentioned vestibular migraine. She gets the dizziness with the headache and without the headache. She felt fine when she was pregnant. She has been to vestibular PT in the past and now. She has tried to manage foods, has been under the care of her pcp and doing therapy. She has watched her food, drinking more fluids, it happens the most when she is driving, not positional, not orthostatic, she feels the cars screaming by and motion makes her feel dizzy. The constant peripheral stimulus makes her tired, feels dizzy, she drives at night and the lights bother her, her visual fields close in on her and she feels like she loses peripheral vision.Her myalgias and arthralgias (which she was extensively evaluated for in the past) went away. The headaches have been ongoing almost 3 years, no known family history of migraines, started with having her child, she has pain occipital pain, dry needling helped, headache is occipital, spreads to the ear and she has ear pain, pulsating and pounding and throbbing, her neck feels numb, her hands feel a lack of sensation. +light sensitivity, sleeping in a dark room helped, movement makes them worse, last 4-72 hours, she can wake with the headache and can be worse positionally and exertionally. She gets dizziness and sometimes frank vertigo with  and without the headache. She has seen spots before headache. Also trouble swallowing. She is not having a lot of headaches and they are mild. Dizziness happens with driving mostly.   Reviewed notes, labs and imaging from outside physicians, which showed:   MRI brain 10/24/2019 showed No acute intracranial abnormalities including mass lesion or mass  effect, hydrocephalus, extra-axial fluid collection, midline shift, hemorrhage, IAC abnormality, or acute infarction, large ischemic events (personally reviewed images)   Observations/Objective:    Physical exam: Exam: Gen: NAD, conversant      WU:[id number] palpitations or chest pain or SOB. VS: Breathing at a normal rate. Weight appears within normal limits. Not febrile. Eyes: Conjunctivae clear without exudates or hemorrhage  Neuro: Detailed Neurologic Exam  Speech:    Speech is normal; fluent and spontaneous with normal comprehension.  Cognition:    The patient is oriented to person, place, and time;     recent and remote memory intact;     language fluent;     normal attention, concentration,     fund of knowledge Cranial Nerves:    The pupils are equal, round, and reactive to light. Visual fields are full to finger confrontation. Extraocular movements are intact.  The face is symmetric with normal sensation. The palate elevates in the midline. Hearing intact. Voice is normal. Shoulder shrug is normal. The tongue has normal motion without fasciculations.   Coordination:    Normal finger to nose  Gait:    Normal native gait  Motor Observation:   no involuntary movements noted. Tone:    Appears normal  Posture:    Posture is normal. normal erect    Strength:    Strength is anti-gravity and symmetric in the upper and lower limbs.      Sensation: intact to LT         Assessment and Plan:  42 y.o. year old female  has a past medical history of AMA (advanced maternal age) multigravida 35+ (08/12/2018), Anemia, Benign paroxysmal positional vertigo (05/11/2017), Depression, Goiter, nontoxic, multinodular (10/19/2008), Herniated cervical disc (2016), Hip pain, Hyperlipidemia (03/15/2017), Lumbar degenerative disc disease (05/25/2016), Muscle spasm of back (12/13/2017), Myofascial pain syndrome (08/27/2014), Nontoxic multinodular goiter (10/19/2008), Overactive bladder  (09/24/2014), Patellofemoral pain syndrome (06/04/2015), Right tennis elbow, Subconjunctival hematoma, left (09/14/2017), Supervision of other high risk pregnancy, antepartum (08/12/2018), Thyroid goiter, and Varicose vein of leg (06/04/2015). here with    ICD-10-CM   1. Migraine without aura and without status migrainosus, not intractable  G43.009     2. Migraine with aura and without status migrainosus, not intractable  G43.109 rizatriptan (MAXALT-MLT) 10 MG disintegrating tablet    Atogepant (QULIPTA) 60 MG TABS         Her migraines are back. First seen for migraines in 12/2019. After the birth of child in 2022 the migraines improved. We started her on Ajovy and she did well and went off of it and just having 3 migraines a month and just using acute medication. The last migraine did not respond t excedrin and zofran. Starting to become more frequent. More vertigo. Feeling worse, more frequent. Nausea and "swimminess", brain is "wooshy" and head pain, nausea, no neck pain, throbbing/pulsating on the right hand side behind the eye, photophobia, lights low help, not positional or exertional, no vision changes. The same quality as before. Can last a whole day and be moderate to severe. No medication overuse. She has 6 migraine days a month and < 10 total headache days a month > 6  months. We discussed options, she doesn't want an injectable.  Medications tried > 2 months: Topiramate, celexa, flexeril, cymbalta, ajovy,ketorolac, lyrica, meclizine.mobic, BP medications contraindicated due to hypotension, imitrex, ajovy, aimovig contraindicated due to constipation, nortriptyline  Rizatriptan: Please take one tablet at the onset of your headache. If it does not improve the symptoms please take one additional tablet. Do not take more then 2 tablets in 24hrs. Do not take use more then 2 to 3 times in a week. Take with zofran 4mg . Next try anther triptan Ubrelvy or Nurtec.   Preventative: Qulipta/ Atogepant  daily. Next I would try Vyepti.    No orders of the defined types were placed in this encounter.   Meds ordered this encounter  Medications   rizatriptan (MAXALT-MLT) 10 MG disintegrating tablet    Sig: Take 1 tablet (10 mg total) by mouth as needed for migraine. May repeat in 2 hours if needed    Dispense:  9 tablet    Refill:  11   Atogepant (QULIPTA) 60 MG TABS    Sig: Take 1 tablet (60 mg total) by mouth daily.    Dispense:  30 tablet    Refill:  11    Anson Fret, MD

## 2023-06-02 NOTE — Telephone Encounter (Signed)
Sent mychart msg informing pt of follow up made with Amy 

## 2023-06-02 NOTE — Telephone Encounter (Signed)
Can you make her a follow up video in 4ish months with Amy please? thanks

## 2023-06-02 NOTE — Patient Instructions (Addendum)
Rizatriptan: Please take one tablet at the onset of your headache. If it does not improve the symptoms please take one additional tablet. Do not take more then 2 tablets in 24hrs. Do not take use more then 2 to 3 times in a week. Take with zofran 4mg . Next try anther triptan Ubrelvy or Nurtec.   Preventative: Qulipta/ Atogepant daily. Next I would try Vyepti.   Atogepant Tablets What is this medication? ATOGEPANT (a TOE je pant) prevents migraines. It works by blocking a substance in the body that causes migraines. This medicine may be used for other purposes; ask your health care provider or pharmacist if you have questions. COMMON BRAND NAME(S): QULIPTA What should I tell my care team before I take this medication? They need to know if you have any of these conditions: Kidney disease Liver disease An unusual or allergic reaction to atogepant, other medications, foods, dyes, or preservatives Pregnant or trying to get pregnant Breast-feeding How should I use this medication? Take this medication by mouth with water. Take it as directed on the prescription label at the same time every day. You can take it with or without food. If it upsets your stomach, take it with food. Keep taking it unless your care team tells you to stop. Talk to your care team about the use of this medication in children. Special care may be needed. Overdosage: If you think you have taken too much of this medicine contact a poison control center or emergency room at once. NOTE: This medicine is only for you. Do not share this medicine with others. What if I miss a dose? If you miss a dose, take it as soon as you can. If it is almost time for your next dose, take only that dose. Do not take double or extra doses. What may interact with this medication? Carbamazepine Certain medications for fungal infections, such as itraconazole, ketoconazole Clarithromycin Cyclosporine Efavirenz Etravirine Phenytoin Rifampin St.  John's wort This list may not describe all possible interactions. Give your health care provider a list of all the medicines, herbs, non-prescription drugs, or dietary supplements you use. Also tell them if you smoke, drink alcohol, or use illegal drugs. Some items may interact with your medicine. What should I watch for while using this medication? Visit your care team for regular checks on your progress. Tell your care team if your symptoms do not start to get better or if they get worse. What side effects may I notice from receiving this medication? Side effects that you should report to your care team as soon as possible: Allergic reactions--skin rash, itching, hives, swelling of the face, lips, tongue, or throat Side effects that usually do not require medical attention (report to your care team if they continue or are bothersome): Constipation Fatigue Loss of appetite with weight loss Nausea This list may not describe all possible side effects. Call your doctor for medical advice about side effects. You may report side effects to FDA at 1-800-FDA-1088. Where should I keep my medication? Keep out of the reach of children and pets. Store at room temperature between 20 and 25 degrees C (68 and 77 degrees F). Get rid of any unused medication after the expiration date. To get rid of medications that are no longer needed or have expired: Take the medication to a medication take-back program. Check with your pharmacy or law enforcement to find a location. If you cannot return the medication, check the label or package insert to see if  the medication should be thrown out in the garbage or flushed down the toilet. If you are not sure, ask your care team. If it is safe to put it in the trash, take the medication out of the container. Mix the medication with cat litter, dirt, coffee grounds, or other unwanted substance. Seal the mixture in a bag or container. Put it in the trash. NOTE: This sheet is a  summary. It may not cover all possible information. If you have questions about this medicine, talk to your doctor, pharmacist, or health care provider.  2024 Elsevier/Gold Standard (2022-01-19 00:00:00)

## 2023-06-15 ENCOUNTER — Telehealth: Payer: Self-pay

## 2023-06-15 ENCOUNTER — Other Ambulatory Visit (HOSPITAL_COMMUNITY): Payer: Self-pay

## 2023-06-15 NOTE — Telephone Encounter (Signed)
Patient Advocate Encounter  Prior authorization for Qulipta 60MG  tablets submitted and APPROVED through [employer].  Test billing returns $0.00 copay for 30 day supply. Quantity approved 30 tabs per 30 days. Insurance also states benefits may change after a total of 3 fills.  Key [id number] Effective: 06-15-2023 - 12-12-2023

## 2023-07-27 ENCOUNTER — Encounter: Payer: Self-pay | Admitting: Neurology

## 2023-07-28 DIAGNOSIS — G43109 Migraine with aura, not intractable, without status migrainosus: Secondary | ICD-10-CM | POA: Diagnosis not present

## 2023-07-28 DIAGNOSIS — G43909 Migraine, unspecified, not intractable, without status migrainosus: Secondary | ICD-10-CM | POA: Diagnosis not present

## 2023-07-28 DIAGNOSIS — R519 Headache, unspecified: Secondary | ICD-10-CM | POA: Diagnosis not present

## 2023-07-28 DIAGNOSIS — I609 Nontraumatic subarachnoid hemorrhage, unspecified: Secondary | ICD-10-CM | POA: Diagnosis not present

## 2023-07-28 DIAGNOSIS — G4489 Other headache syndrome: Secondary | ICD-10-CM | POA: Diagnosis not present

## 2023-07-28 DIAGNOSIS — R55 Syncope and collapse: Secondary | ICD-10-CM | POA: Diagnosis not present

## 2023-07-28 DIAGNOSIS — R110 Nausea: Secondary | ICD-10-CM | POA: Diagnosis not present

## 2023-07-28 DIAGNOSIS — W19XXXA Unspecified fall, initial encounter: Secondary | ICD-10-CM | POA: Diagnosis not present

## 2023-08-04 ENCOUNTER — Ambulatory Visit: Payer: Federal, State, Local not specified - PPO | Admitting: Sports Medicine

## 2023-08-04 ENCOUNTER — Encounter: Payer: Self-pay | Admitting: Sports Medicine

## 2023-08-04 VITALS — BP 124/78 | HR 81

## 2023-08-04 DIAGNOSIS — E7800 Pure hypercholesterolemia, unspecified: Secondary | ICD-10-CM | POA: Diagnosis not present

## 2023-08-04 DIAGNOSIS — R55 Syncope and collapse: Secondary | ICD-10-CM | POA: Insufficient documentation

## 2023-08-04 DIAGNOSIS — R3129 Other microscopic hematuria: Secondary | ICD-10-CM | POA: Diagnosis not present

## 2023-08-04 NOTE — Progress Notes (Addendum)
    Procedures performed today:    None.  Independent interpretation of notes and tests performed by another provider:   None.  Brief History, Exam, Impression, and Recommendations:    Syncope Dawn [name] is a pleasant 42 year old female, she does have a history of refractory migraines, hypotension, more recently a week ago she had an episode of syncope, she developed a headache that felt different than her typical migraine headaches, it was bitemporal, she also felt some degree of temperature dysregulation, she describes this as if she had a fever that broke. She took her Maxalt but it was not effective, when she went to take other Maxalt she lost consciousness and woke up on the ground. She denies any prodromal cardiac symptoms such as palpitations, tachycardia, chest pain, no shortness of breath. She had no postictal type symptoms, no tongue or lip biting, no incontinence. She simply felt clammy. Typically she does not get orthostatic. She was seen in the emergency department, workup including basic labs and ECG were normal with the exception of leukocytosis with a left shift. She was given cardiac event monitor and just turned it in recently.  We are still awaiting results. She had a head CT angiography that was normal.  Of note she did have a brain MRI in 2020 that was normal. She is established with Dr. Lucia Gaskins with neurology. She also tells me she has had cardiac event monitoring in the past that was normal. Her exam today was normal. Differential does include seizure activity, vasovagal syncope, orthostatic syncope, cardiac arrhythmia. Seizure activity is significantly less likely based on the report of the preceding and post seeding events, orthostasis due to what sounds to be concurrent viral infection is highly likely, cardiac arrhythmia also unlikely but she is having a rhythm monitoring. I would like to recheck labs today. Return to see me in 2 weeks.  Hematuria,  microscopic Need to recheck UA in 2 weeks.  Hyperlipidemia Lipids highly elevated, starting Crestor, recheck fasting lipids in 3 months.  I spent 30 minutes of total time managing this patient today, this includes chart review, face to face, and non-face to face time.  ____________________________________________ Ihor Austin. Dawn [name], M.D., ABFM., CAQSM., AME. Primary Care and Sports Medicine [facility]  Adjunct Professor of Family Medicine  [place] [employer]  Restaurant manager, fast food

## 2023-08-04 NOTE — Assessment & Plan Note (Addendum)
Dawn [name] is a pleasant 42 year old female, she does have a history of refractory migraines, hypotension, more recently a week ago she had an episode of syncope, she developed a headache that felt different than her typical migraine headaches, it was bitemporal, she also felt some degree of temperature dysregulation, she describes this as if she had a fever that broke. She took her Maxalt but it was not effective, when she went to take other Maxalt she lost consciousness and woke up on the ground. She denies any prodromal cardiac symptoms such as palpitations, tachycardia, chest pain, no shortness of breath. She had no postictal type symptoms, no tongue or lip biting, no incontinence. She simply felt clammy. Typically she does not get orthostatic. She was seen in the emergency department, workup including basic labs and ECG were normal with the exception of leukocytosis with a left shift. She was given cardiac event monitor and just turned it in recently.  We are still awaiting results. She had a head CT angiography that was normal.  Of note she did have a brain MRI in 2020 that was normal. She is established with Dr. Lucia Gaskins with neurology. She also tells me she has had cardiac event monitoring in the past that was normal. Her exam today was normal. Differential does include seizure activity, vasovagal syncope, orthostatic syncope, cardiac arrhythmia. Seizure activity is significantly less likely based on the report of the preceding and post seeding events, orthostasis due to what sounds to be concurrent viral infection is highly likely, cardiac arrhythmia also unlikely but she is having a rhythm monitoring. I would like to recheck labs today. Return to see me in 2 weeks.

## 2023-08-05 ENCOUNTER — Encounter: Payer: Self-pay | Admitting: Sports Medicine

## 2023-08-05 DIAGNOSIS — R55 Syncope and collapse: Secondary | ICD-10-CM | POA: Diagnosis not present

## 2023-08-05 LAB — POCT URINALYSIS DIP (CLINITEK)
Bilirubin, UA: NEGATIVE
Glucose, UA: NEGATIVE mg/dL
Ketones, POC UA: NEGATIVE mg/dL
Leukocytes, UA: NEGATIVE
Nitrite, UA: NEGATIVE
POC PROTEIN,UA: NEGATIVE
Spec Grav, UA: 1.015 (ref 1.010–1.025)
Urobilinogen, UA: 0.2 E.U./dL
pH, UA: 5.5 (ref 5.0–8.0)

## 2023-08-05 NOTE — Assessment & Plan Note (Signed)
Need to recheck UA in 2 weeks.

## 2023-08-05 NOTE — Addendum Note (Signed)
Addended by: Holland Falling on: 08/05/2023 04:55 PM   Modules accepted: Orders

## 2023-08-06 LAB — COMPREHENSIVE METABOLIC PANEL
ALT: 6 IU/L (ref 0–32)
AST: 15 IU/L (ref 0–40)
Albumin: 4.5 g/dL (ref 3.9–4.9)
Alkaline Phosphatase: 52 IU/L (ref 44–121)
BUN/Creatinine Ratio: 16 (ref 9–23)
BUN: 11 mg/dL (ref 6–24)
Bilirubin Total: 0.5 mg/dL (ref 0.0–1.2)
CO2: 23 mmol/L (ref 20–29)
Calcium: 9.3 mg/dL (ref 8.7–10.2)
Chloride: 101 mmol/L (ref 96–106)
Creatinine, Ser: 0.69 mg/dL (ref 0.57–1.00)
Globulin, Total: 2.5 g/dL (ref 1.5–4.5)
Glucose: 84 mg/dL (ref 70–99)
Potassium: 4 mmol/L (ref 3.5–5.2)
Sodium: 139 mmol/L (ref 134–144)
Total Protein: 7 g/dL (ref 6.0–8.5)
eGFR: 110 mL/min/{1.73_m2} (ref >59)

## 2023-08-06 LAB — CBC WITH DIFFERENTIAL/PLATELET
Basophils Absolute: 0 10*3/uL (ref 0.0–0.2)
Basos: 1 %
EOS (ABSOLUTE): 0.1 10*3/uL (ref 0.0–0.4)
Eos: 2 %
Hematocrit: 45.7 % (ref 34.0–46.6)
Hemoglobin: 14.7 g/dL (ref 11.1–15.9)
Immature Grans (Abs): 0 10*3/uL (ref 0.0–0.1)
Immature Granulocytes: 0 %
Lymphocytes Absolute: 1.4 10*3/uL (ref 0.7–3.1)
Lymphs: 19 %
MCH: 28.8 pg (ref 26.6–33.0)
MCHC: 32.2 g/dL (ref 31.5–35.7)
MCV: 90 fL (ref 79–97)
Monocytes Absolute: 0.4 10*3/uL (ref 0.1–0.9)
Monocytes: 6 %
Neutrophils Absolute: 5.1 10*3/uL (ref 1.4–7.0)
Neutrophils: 72 %
Platelets: 245 10*3/uL (ref 150–450)
RBC: 5.1 x10E6/uL (ref 3.77–5.28)
RDW: 12.8 % (ref 11.7–15.4)
WBC: 7.1 10*3/uL (ref 3.4–10.8)

## 2023-08-06 LAB — T3, FREE: T3, Free: 2.7 pg/mL (ref 2.0–4.4)

## 2023-08-06 LAB — LIPID PANEL
Chol/HDL Ratio: 3.8 ratio (ref 0.0–4.4)
Cholesterol, Total: 236 mg/dL — ABNORMAL HIGH (ref 100–199)
HDL: 62 mg/dL (ref >39)
LDL Chol Calc (NIH): 153 mg/dL — ABNORMAL HIGH (ref 0–99)
Triglycerides: 120 mg/dL (ref 0–149)
VLDL Cholesterol Cal: 21 mg/dL (ref 5–40)

## 2023-08-06 LAB — TSH: TSH: 2 u[IU]/mL (ref 0.450–4.500)

## 2023-08-06 LAB — HEMOGLOBIN A1C
Est. average glucose Bld gHb Est-mCnc: 105 mg/dL
Hgb A1c MFr Bld: 5.3 % (ref 4.8–5.6)

## 2023-08-06 LAB — T4, FREE: Free T4: 1.19 ng/dL (ref 0.82–1.77)

## 2023-08-06 MED ORDER — ROSUVASTATIN CALCIUM 10 MG PO TABS: 10.0000 mg | ORAL_TABLET | Freq: Every day | ORAL | 3 refills | Status: AC

## 2023-08-06 NOTE — Assessment & Plan Note (Addendum)
Lipids highly elevated, starting Crestor, recheck fasting lipids in 3 months.

## 2023-08-06 NOTE — Addendum Note (Signed)
Addended by: Monica Becton on: 08/06/2023 12:23 PM   Modules accepted: Orders

## 2023-08-07 LAB — URINE CULTURE

## 2023-08-10 DIAGNOSIS — I493 Ventricular premature depolarization: Secondary | ICD-10-CM | POA: Diagnosis not present

## 2023-08-10 DIAGNOSIS — R55 Syncope and collapse: Secondary | ICD-10-CM | POA: Diagnosis not present

## 2023-08-10 DIAGNOSIS — R000 Tachycardia, unspecified: Secondary | ICD-10-CM | POA: Diagnosis not present

## 2023-08-11 ENCOUNTER — Encounter: Payer: Self-pay | Admitting: Sports Medicine

## 2023-08-11 DIAGNOSIS — R55 Syncope and collapse: Secondary | ICD-10-CM

## 2023-08-18 ENCOUNTER — Ambulatory Visit: Payer: Federal, State, Local not specified - PPO | Admitting: Sports Medicine

## 2023-08-18 VITALS — BP 137/80 | HR 81

## 2023-08-18 DIAGNOSIS — G43109 Migraine with aura, not intractable, without status migrainosus: Secondary | ICD-10-CM | POA: Diagnosis not present

## 2023-08-18 DIAGNOSIS — R3129 Other microscopic hematuria: Secondary | ICD-10-CM

## 2023-08-18 DIAGNOSIS — R55 Syncope and collapse: Secondary | ICD-10-CM

## 2023-08-18 LAB — POCT URINALYSIS DIP (CLINITEK)
Bilirubin, UA: NEGATIVE
Glucose, UA: NEGATIVE mg/dL
Ketones, POC UA: NEGATIVE mg/dL
Leukocytes, UA: NEGATIVE
Nitrite, UA: NEGATIVE
POC PROTEIN,UA: NEGATIVE
Spec Grav, UA: 1.01 (ref 1.010–1.025)
Urobilinogen, UA: 0.2 U/dL
pH, UA: 6 (ref 5.0–8.0)

## 2023-08-18 MED ORDER — CELECOXIB 200 MG PO CAPS: ORAL_CAPSULE | ORAL | 2 refills | Status: DC

## 2023-08-18 MED ORDER — ONDANSETRON HCL 4 MG PO TABS: 4.0000 mg | ORAL_TABLET | Freq: Three times a day (TID) | ORAL | 11 refills | Status: AC | PRN

## 2023-08-18 NOTE — Addendum Note (Signed)
Addended by: Carren Rang A on: 08/18/2023 01:40 PM   Modules accepted: Orders

## 2023-08-18 NOTE — Assessment & Plan Note (Signed)
Previous history:  Dawn [name] is a pleasant 42 year old female, she does have a history of refractory migraines, hypotension, more recently a week ago she had an episode of syncope, she developed a headache that felt different than her typical migraine headaches, it was bitemporal, she also felt some degree of temperature dysregulation, she describes this as if she had a fever that broke. She took her Maxalt but it was not effective, when she went to take other Maxalt she lost consciousness and woke up on the ground. She denies any prodromal cardiac symptoms such as palpitations, tachycardia, chest pain, no shortness of breath. She had no postictal type symptoms, no tongue or lip biting, no incontinence. She simply felt clammy. Typically she does not get orthostatic. She was seen in the emergency department, workup including basic labs and ECG were normal with the exception of leukocytosis with a left shift. She was given cardiac event monitor and just turned it in recently.  We are still awaiting results. She had a head CT angiography that was normal.  Of note she did have a brain MRI in 2020 that was normal. She is established with Dr. Lucia Gaskins with neurology. She also tells me she has had cardiac event monitoring in the past that was normal. Her exam today was normal. Differential does include seizure activity, vasovagal syncope, orthostatic syncope, cardiac arrhythmia. Seizure activity is significantly less likely based on the report of the preceding and post seeding events, orthostasis due to what sounds to be concurrent viral infection is highly likely, cardiac arrhythmia also unlikely but she is having a rhythm monitoring. I would like to recheck labs today. Return to see me in 2 weeks.  Update 08/18/2023: Dawn [name] returns, she is a pleasant 42 year old female with the above history of syncope, at this point her workup has been unrevealing, she has had no further episodes, she does have a bit of  fullness left second or third costal cartilage consistent with costochondritis, minimally tender. All of the above points to a viral process and benign potentially orthostatic syncope. We will hold off on additional workup for seizure disorder, but if this recurs we will certainly complete the workup.

## 2023-08-18 NOTE — Assessment & Plan Note (Signed)
Historically under good control with occasional Maxalt, Qulipta. Switching from ibuprofen to Celebrex per patient request and refilling her Zofran.

## 2023-08-18 NOTE — Assessment & Plan Note (Addendum)
Hematuria noted August 22 still present today but patient is on her cycle. We will bring her back in about a week and a half to recheck her urinalysis with a nurse visit.  Update: Unfortunately at the 1 week urinalysis recheck she still has trace hematuria, proceeding with renal ultrasound.  Update: Renal ultrasound is unrevealing/negative, no masses, no cysts, no stones.  We will observe for now, and check another urinalysis with her routine labs when she comes back for her next annual physical.

## 2023-08-18 NOTE — Progress Notes (Addendum)
    Procedures performed today:    None.  Independent interpretation of notes and tests performed by another provider:   None.  Brief History, Exam, Impression, and Recommendations:    Hematuria, microscopic Hematuria noted August 22 still present today but patient is on her cycle. We will bring her back in about a week and a half to recheck her urinalysis with a nurse visit.  Update: Unfortunately at the 1 week urinalysis recheck she still has trace hematuria, proceeding with renal ultrasound.  Update: Renal ultrasound is unrevealing/negative, no masses, no cysts, no stones.  We will observe for now, and check another urinalysis with her routine labs when she comes back for her next annual physical.   Syncope Previous history:  Dawn [name] is a pleasant 42 year old female, she does have a history of refractory migraines, hypotension, more recently a week ago she had an episode of syncope, she developed a headache that felt different than her typical migraine headaches, it was bitemporal, she also felt some degree of temperature dysregulation, she describes this as if she had a fever that broke. She took her Maxalt but it was not effective, when she went to take other Maxalt she lost consciousness and woke up on the ground. She denies any prodromal cardiac symptoms such as palpitations, tachycardia, chest pain, no shortness of breath. She had no postictal type symptoms, no tongue or lip biting, no incontinence. She simply felt clammy. Typically she does not get orthostatic. She was seen in the emergency department, workup including basic labs and ECG were normal with the exception of leukocytosis with a left shift. She was given cardiac event monitor and just turned it in recently.  We are still awaiting results. She had a head CT angiography that was normal.  Of note she did have a brain MRI in 2020 that was normal. She is established with Dr. Lucia Gaskins with neurology. She also tells me she  has had cardiac event monitoring in the past that was normal. Her exam today was normal. Differential does include seizure activity, vasovagal syncope, orthostatic syncope, cardiac arrhythmia. Seizure activity is significantly less likely based on the report of the preceding and post seeding events, orthostasis due to what sounds to be concurrent viral infection is highly likely, cardiac arrhythmia also unlikely but she is having a rhythm monitoring. I would like to recheck labs today. Return to see me in 2 weeks.  Update 08/18/2023: Dawn [name] returns, she is a pleasant 42 year old female with the above history of syncope, at this point her workup has been unrevealing, she has had no further episodes, she does have a bit of fullness left second or third costal cartilage consistent with costochondritis, minimally tender. All of the above points to a viral process and benign potentially orthostatic syncope. We will hold off on additional workup for seizure disorder, but if this recurs we will certainly complete the workup.  Migraine aura without headache Historically under good control with occasional Maxalt, Qulipta. Switching from ibuprofen to Celebrex per patient request and refilling her Zofran.    ____________________________________________ Ihor Austin. Benjamin Stain, M.D., ABFM., CAQSM., AME. Primary Care and Sports Medicine  [facility]  Adjunct Professor of Family Medicine  [employer]  Restaurant manager, [employer] food

## 2023-08-30 ENCOUNTER — Ambulatory Visit (INDEPENDENT_AMBULATORY_CARE_PROVIDER_SITE_OTHER): Payer: Federal, State, Local not specified - PPO

## 2023-08-30 ENCOUNTER — Encounter: Payer: Self-pay | Admitting: Sports Medicine

## 2023-08-30 DIAGNOSIS — R3129 Other microscopic hematuria: Secondary | ICD-10-CM | POA: Diagnosis not present

## 2023-08-30 LAB — POCT URINALYSIS DIP (CLINITEK)
Bilirubin, UA: NEGATIVE
Glucose, UA: NEGATIVE mg/dL
Ketones, POC UA: NEGATIVE mg/dL
Leukocytes, UA: NEGATIVE
Nitrite, UA: NEGATIVE
POC PROTEIN,UA: NEGATIVE
Spec Grav, UA: 1.02 (ref 1.010–1.025)
Urobilinogen, UA: 0.2 U/dL
pH, UA: 6.5 (ref 5.0–8.0)

## 2023-08-30 NOTE — Addendum Note (Signed)
Addended by: Monica Becton on: 08/30/2023 03:56 PM   Modules accepted: Orders

## 2023-08-30 NOTE — Progress Notes (Signed)
POCT UA by Rae Halsted

## 2023-08-31 ENCOUNTER — Other Ambulatory Visit: Payer: Self-pay | Admitting: Sports Medicine

## 2023-08-31 DIAGNOSIS — Z1231 Encounter for screening mammogram for malignant neoplasm of breast: Secondary | ICD-10-CM

## 2023-09-06 ENCOUNTER — Ambulatory Visit: Payer: Federal, State, Local not specified - PPO

## 2023-09-06 DIAGNOSIS — N1330 Unspecified hydronephrosis: Secondary | ICD-10-CM | POA: Diagnosis not present

## 2023-09-06 DIAGNOSIS — R3129 Other microscopic hematuria: Secondary | ICD-10-CM | POA: Diagnosis not present

## 2023-09-16 NOTE — Progress Notes (Signed)
PATIENT: Dawn [name] DOB: 12-17-1980  REASON FOR VISIT: follow up HISTORY FROM: patient  Virtual Visit via Telephone Note  I connected with Dawn [name] on 09/21/23 at  8:45 AM EDT by telephone and verified that I am speaking with the correct person using two identifiers.   I discussed the limitations, risks, security and privacy concerns of performing an evaluation and management service by telephone and the availability of in person appointments. I also discussed with the patient that there may be a patient responsible charge related to this service. The patient expressed understanding and agreed to proceed.   History of Present Illness:  09/21/23 ALL (Mychart): Dawn [name] returns for follow up for migraines. She was seen by Dr Lucia Gaskins 05/2023 and started on Qulipta. Previously on Ajovy but stopped d/t feeling it was making her irritable. Since, she reports doing better. She had a viral illness about 2 months ago where she had an intractable headache and syncopal event. WBC was elevated. She reports headaches have improved since recovering. She has taken rizatriptan 2-3 times over the past month. She is tolerating Qulipta. May see more headaches around menstrual cycle.   Medications tried: Ascencion Dike, Amovig contraindicated d/t constipation, topiramate, Celexa, Flexeril, Cymbalta, Lyrica, BP medications contraindicated due to hypotension, Imitrex, meclizine, meloxicam, ketorolac  06/02/2023 AA (Mychart): has Family history of breast cancer; Myofascial pain syndrome; Goiter, nontoxic, multinodular; Overactive bladder; Varicose vein of leg; Patellofemoral pain syndrome; Lumbar spondylosis; Hyperlipidemia; Vertigo; Subconjunctival hematoma, left; Elevated PTHrP level; Viral syndrome; Vestibular migraine; Chronic migraine without aura without status migrainosus, not intractable; Migraine aura without headache; Migraine with aura and without status migrainosus, not intractable;  Lateral epicondylitis, right elbow; Mid back pain on right side; Pain aggravated by coughing and deep breathing; Coccydynia; and Numbness and tingling in right hand on their problem list.   Her migraines are back. First seen for migraines in 12/2019. After the birth of child in 2022 the migraines improved. We started her on Ajovy and she did well and went off of it and just having 3 migraines a month and just using acute medication. The last migraine did not respond t excedrin and zofran. Starting to become more frequent. More vertigo. Feeling worse, more frequent. Nausea and "swimminess", brain is "wooshy" and head pain, nausea, no neck pain, throbbing/pulsating on the right hand side behind the eye, photophobia, lights low help, not positional or exertional, no vision changes. The same quality as before. Can last a whole day and be moderate to severe. No medication overuse. She has 6 migraine days a month and < 10 total headache days a month > 6 months. We discussed options, she doesn't want an injectable.   Medications tried > 2 months: Topiramate, celexa, flexeril, cymbalta, ajovy,ketorolac, lyrica, meclizine.mobic, BP medications contraindicated due to hypotension, imitrex, ajovy, aimovig contraindicated due to constipation  04/29/2021 (Mychart): Dawn [name] returns for follow up for vestibular migraines. She stopped Ajovy in 11/2020 for concerns of increased irritability. She reports that headaches have been well managed sine discontinuation. She has about 3 headache days per month. Ondansetron helps to abort headaches. Occasionally takes off brand Excedrin. She feels that she is doing.    04/24/2020 ALL:  Dawn [name] is a 42 y.o. female here today for follow up. She was started on Ajovy for concerns of vestibular migraines.  She reports that symptoms have improved since continuing Ajovy monthly.  She is uncertain if Zofran has helped but she has tolerated this medication when needed.  She  has  intermittent dizziness but feels that it has significantly improved.  Meclizine and physical therapy have not been helpful in the past.   History (copied from Dr Trevor Mace note on 12/25/2019)  HPI:  Dawn [name] is a 43 y.o. female here as requested by Monica Becton,* for vertigo.  I reviewed referring physician's notes.  Past medical history anemia with pregnancy, benign paroxysmal positional vertigo, depression, herniated cervical disc, hyperlipidemia, lumbar degenerative disc disease, myofascial pain syndrome with a negative cervical spine MRI, negative brain MRI, nerve conduction and EMG and normal extensive blood work-up for peripheral neuropathy and myopathy, nontoxic multinodular goiter.  Patient has been breast-feeding, at appointment in November she complained of pain and pressure as well as nasal discharge, pressure behind the maxillary sinus with radiation to the teeth, mild cough, fatigue, low-grade fevers, myalgias.  Husband and daughter were having similar symptoms.  I have seen patient in the past but not for over 4 years.  In the past she had extensive imaging of the brain and the cervical spine, extensive lab work myopathy and paresthesias, and arthralgias, also EMG nerve conduction study at that time.  She was on Cymbalta and Lyrica when I saw her.  She had tried Flexeril in the past.  Extensive work-up in the past unremarkable.  She has been going to physical therapy, I reviewed notes, vestibular rehabilitation and dizziness, herniated cervical disc and vertigo and she has been working with physical therapy for this.  She last went to therapy December 07, 2019.  She also saw Brynda Peon in otolaryngology, for spells of dizziness and sometimes vertigo, no nausea or vomiting, feels her hearing is globally depressed but nonspecific to 1 year, no positional component, no change in tinnitus, she is to have headaches associated with it less so now, she has been diagnosed with  vestibular migraines in the past.  I reviewed his notes, normal head and neck pain, no ear pathology, symptoms consistent with vestibular migraine.   She gave birth March 22nd this year.She had a boy, she is working from home and baby is in daycare. They also have a daughter, 63 years old. She is not breastfeeding. She breast fed for 6 months. 2-3 years ago she went for PT and the therapist mentioned vestibular migraine. She gets the dizziness with the headache and without the headache. She felt fine when she was pregnant. She has been to vestibular PT in the past and now. She has tried to manage foods, has been under the care of her pcp and doing therapy. She has watched her food, drinking more fluids, it happens the most when she is driving, not positional, not orthostatic, she feels the cars screaming by and motion makes her feel dizzy. The constant peripheral stimulus makes her tired, feels dizzy, she drives at night and the lights bother her, her visual fields close in on her and she feels like she loses peripheral vision.Her myalgias and arthralgias (which she was extensively evaluated for in the past) went away. The headaches have been ongoing almost 3 years, no known family history of migraines, started with having her child, she has pain occipital pain, dry needling helped, headache is occipital, spreads to the ear and she has ear pain, pulsating and pounding and throbbing, her neck feels numb, her hands feel a lack of sensation. +light sensitivity, sleeping in a dark room helped, movement makes them worse, last 4-72 hours, she can wake with the headache and can be worse positionally and exertionally.  She gets dizziness and sometimes frank vertigo with and without the headache. She has seen spots before headache. Also trouble swallowing. She is not having a lot of headaches and they are mild. Dizziness happens with driving mostly.   Reviewed notes, labs and imaging from outside physicians, which  showed:   MRI brain 10/24/2019 showed No acute intracranial abnormalities including mass lesion or mass effect, hydrocephalus, extra-axial fluid collection, midline shift, hemorrhage, IAC abnormality, or acute infarction, large ischemic events (personally reviewed images)   Observations/Objective:  Generalized: Well developed, in no acute distress  Mentation: Alert oriented to time, place, history taking. Follows all commands speech and language fluent   Assessment and Plan:  42 y.o. year old female  has a past medical history of AMA (advanced maternal age) multigravida 35+ (08/12/2018), Anemia, Benign paroxysmal positional vertigo (05/11/2017), Depression, Goiter, nontoxic, multinodular (10/19/2008), Herniated cervical disc (2016), Hip pain, Hyperlipidemia (03/15/2017), Lumbar degenerative disc disease (05/25/2016), Muscle spasm of back (12/13/2017), Myofascial pain syndrome (08/27/2014), Nontoxic multinodular goiter (10/19/2008), Overactive bladder (09/24/2014), Patellofemoral pain syndrome (06/04/2015), Right tennis elbow, Subconjunctival hematoma, left (09/14/2017), Supervision of other high risk pregnancy, antepartum (08/12/2018), Thyroid goiter, and Varicose vein of leg (06/04/2015). here with    ICD-10-CM   1. Migraine without aura and without status migrainosus, not intractable  G43.009       Dawn [name] reports that headaches have repsonded well, recently, to [place]. We will continue 60mg  daily. She may continue rizatriptan and ondansetron for abortive therapy but was advised against regular use. May consider switching to [facility] in the future, if needed. She will follow-up closely with primary care. She will follow-up with [place] in 1 year, sooner if needed.  She verbalizes understanding and agreement with this plan.   No orders of the defined types were placed in this encounter.   No orders of the defined types were placed in this encounter.    Follow Up Instructions:  I discussed the  assessment and treatment plan with the patient. The patient was provided an opportunity to ask questions and all were answered. The patient agreed with the plan and demonstrated an understanding of the instructions.   The patient was advised to call back or seek an in-person evaluation if the symptoms worsen or if the condition fails to improve as anticipated.  I provided 15 minutes of non-face-to-face time during this encounter.  Patient located at her place of residence during my chart visit.  Provider is in the office.   Shawnie Dapper, NP    Agree with assessment and plan

## 2023-09-16 NOTE — Patient Instructions (Signed)
Below is our plan:  We will continue Qulipta, rizatriptan and ondansetron. Let me know if headaches worsen.   Please make sure you are staying well hydrated. I recommend 50-60 ounces daily. Well balanced diet and regular exercise encouraged. Consistent sleep schedule with 6-8 hours recommended.   Please continue follow up with care team as directed.   Follow up with me in 1 year   You may receive a survey regarding today's visit. I encourage you to leave honest feed back as I do use this information to improve patient care. Thank you for seeing me today!   GENERAL HEADACHE INFORMATION:   Natural supplements: Magnesium Oxide or Magnesium Glycinate 500 mg at bed (up to 800 mg daily) Coenzyme Q10 300 mg in AM Vitamin B2- 200 mg twice a day   Add 1 supplement at a time since even natural supplements can have undesirable side effects. You can sometimes buy supplements cheaper (especially Coenzyme Q10) at www.WebmailGuide.co.za or at [facility].  Migraine with aura: There is increased risk for stroke in women with migraine with aura and a contraindication for the combined contraceptive pill for use by women who have migraine with aura. The risk for women with migraine without aura is lower. However other risk factors like smoking are far more likely to increase stroke risk than migraine. There is a recommendation for no smoking and for the use of OCPs without estrogen such as progestogen only pills particularly for women with migraine with aura.Marland Kitchen People who have migraine headaches with auras may be 3 times more likely to have a stroke caused by a blood clot, compared to migraine patients who don't see auras. Women who take hormone-replacement therapy may be 30 percent more likely to suffer a clot-based stroke than women not taking medication containing estrogen. Other risk factors like smoking and high blood pressure may be  much more important.    Vitamins and herbs that show potential:   Magnesium:  Magnesium (250 mg twice a day or 500 mg at bed) has a relaxant effect on smooth muscles such as blood vessels. Individuals suffering from frequent or daily headache usually have low magnesium levels which can be increase with daily supplementation of 400-750 mg. Three trials found 40-90% average headache reduction  when used as a preventative. Magnesium may help with headaches are aura, the best evidence for magnesium is for migraine with aura is its thought to stop the cortical spreading depression we believe is the pathophysiology of migraine aura.Magnesium also demonstrated the benefit in menstrually related migraine.  Magnesium is part of the messenger system in the serotonin cascade and it is a good muscle relaxant.  It is also useful for constipation which can be a side effect of other medications used to treat migraine. Good sources include nuts, whole grains, and tomatoes. Side Effects: loose stool/diarrhea  Riboflavin (vitamin B 2) 200 mg twice a day. This vitamin assists nerve cells in the production of ATP a principal energy storing molecule.  It is necessary for many chemical reactions in the body.  There have been at least 3 clinical trials of riboflavin using 400 mg per day all of which suggested that migraine frequency can be decreased.  All 3 trials showed significant improvement in over half of migraine sufferers.  The supplement is found in bread, cereal, milk, meat, and poultry.  Most Americans get more riboflavin than the recommended daily allowance, however riboflavin deficiency is not necessary for the supplements to help prevent headache. Side effects: energizing, green  urine   Coenzyme Q10: This is present in almost all cells in the body and is critical component for the conversion of energy.  Recent studies have shown that a nutritional supplement of CoQ10 can reduce the frequency of migraine attacks by improving the energy production of cells as with riboflavin.  Doses of 150 mg twice a  day have been shown to be effective.   Melatonin: Increasing evidence shows correlation between melatonin secretion and headache conditions.  Melatonin supplementation has decreased headache intensity and duration.  It is widely used as a sleep aid.  Sleep is natures way of dealing with migraine.  A dose of 3 mg is recommended to start for headaches including cluster headache. Higher doses up to 15 mg has been reviewed for use in Cluster headache and have been used. The rationale behind using melatonin for cluster is that many theories regarding the cause of Cluster headache center around the disruption of the normal circadian rhythm in the brain.  This helps restore the normal circadian rhythm.   HEADACHE DIET: Foods and beverages which may trigger migraine Note that only 20% of headache patients are food sensitive. You will know if you are food sensitive if you get a headache consistently 20 minutes to 2 hours after eating a certain food. Only cut out a food if it causes headaches, otherwise you might remove foods you enjoy! What matters most for diet is to eat a well balanced healthy diet full of vegetables and low fat protein, and to not miss meals.   Chocolate, other sweets ALL cheeses except cottage and cream cheese Dairy products, yogurt, sour cream, ice cream Liver Meat extracts (Bovril, Marmite, meat tenderizers) Meats or fish which have undergone aging, fermenting, pickling or smoking. These include: Hotdogs,salami,Lox,sausage, mortadellas,smoked salmon, pepperoni, Pickled herring Pods of broad bean (English beans, Chinese pea pods, [place] (fava) beans, lima and navy beans Ripe avocado, ripe banana Yeast extracts or active yeast preparations such as Brewer's or Fleishman's (commercial bakes goods are permitted) Tomato based foods, pizza (lasagna, etc.)   MSG (monosodium glutamate) is disguised as many things; look for these common aliases: Monopotassium glutamate Autolysed  yeast Hydrolysed protein Sodium caseinate "flavorings" "all natural preservatives" Nutrasweet   Avoid all other foods that convincingly provoke headaches.   Resources: The Dizzy [place]air Laundry Your Headache Diet, migrainestrong.com  https://zamora-andrews.com/   Caffeine and Migraine For patients that have migraine, caffeine intake more than 3 days per week can lead to dependency and increased migraine frequency. I would recommend cutting back on your caffeine intake as best you can. The recommended amount of caffeine is 200-300 mg daily, although migraine patients may experience dependency at even lower doses. While you may notice an increase in headache temporarily, cutting back will be helpful for headaches in the long run. For more information on caffeine and migraine, visit: https://americanmigrainefoundation.org/resource-library/caffeine-and-migraine/   Headache Prevention Strategies:   1. Maintain a headache diary; learn to identify and avoid triggers.  - This can be a simple note where you log when you had a headache, associated symptoms, and medications used - There are several smartphone apps developed to help track migraines: Migraine Buddy, Migraine Monitor, Curelator N1-Headache App   Common triggers include: Emotional triggers: Emotional/Upset family or friends Emotional/Upset occupation Business reversal/success Anticipation anxiety Crisis-serious Post-crisis periodNew job/position   Physical triggers: Vacation Day Weekend Strenuous Exercise High Altitude Location New Move Menstrual Day Physical Illness Oversleep/Not enough sleep Weather changes Light: Photophobia or light sesnitivity treatment involves a balance between  desensitization and reduction in overly strong input. Use dark polarized glasses outside, but not inside. Avoid bright or fluorescent light, but do not dim environment to the point that going into a  normally lit room hurts. Consider FL-41 tint lenses, which reduce the most irritating wavelengths without blocking too much light.  These can be obtained at axonoptics.com or theraspecs.com Foods: see list above.   2. Limit use of acute treatments (over-the-counter medications, triptans, etc.) to no more than 2 days per week or 10 days per month to prevent medication overuse headache (rebound headache).     3. Follow a regular schedule (including weekends and holidays): Don't skip meals. Eat a balanced diet. 8 hours of sleep nightly. Minimize stress. Exercise 30 minutes per day. Being overweight is associated with a 5 times increased risk of chronic migraine. Keep well hydrated and drink 6-8 glasses of water per day.   4. Initiate non-pharmacologic measures at the earliest onset of your headache. Rest and quiet environment. Relax and reduce stress. Breathe2Relax is a free app that can instruct you on    some simple relaxtion and breathing techniques. Http://Dawnbuse.com is a    free website that provides teaching videos on relaxation.  Also, there are  many apps that   can be downloaded for "mindful" relaxation.  An app called YOGA NIDRA will help walk you through mindfulness. Another app called Calm can be downloaded to give you a structured mindfulness guide with daily reminders and skill development. Headspace for guided meditation Mindfulness Based Stress Reduction Online Course: www.palousemindfulness.com Cold compresses.   5. Don't wait!! Take the maximum allowable dosage of prescribed medication at the first sign of migraine.   6. Compliance:  Take prescribed medication regularly as directed and at the first sign of a migraine.   7. Communicate:  Call your physician when problems arise, especially if your headaches change, increase in frequency/severity, or become associated with neurological symptoms (weakness, numbness, slurred speech, etc.). Proceed to emergency room if you experience  new or worsening symptoms or symptoms do not resolve, if you have new neurologic symptoms or if headache is severe, or for any concerning symptom.   8. Headache/pain management therapies: Consider various complementary methods, including medication, behavioral therapy, psychological counselling, biofeedback, massage therapy, acupuncture, dry needling, and other modalities.  Such measures may reduce the need for medications. Counseling for pain management, where patients learn to function and ignore/minimize their pain, seems to work very well.   9. Recommend changing family's attention and focus away from patient's headaches. Instead, emphasize daily activities. If first question of day is 'How are your headaches/Do you have a headache today?', then patient will constantly think about headaches, thus making them worse. Goal is to re-direct attention away from headaches, toward daily activities and other distractions.   10. Helpful Websites: www.AmericanHeadacheSociety.org PatentHood.ch www.headaches.org TightMarket.nl www.achenet.org

## 2023-09-21 ENCOUNTER — Encounter: Payer: Self-pay | Admitting: Family Medicine

## 2023-09-21 ENCOUNTER — Telehealth: Payer: Federal, State, Local not specified - PPO | Admitting: Family Medicine

## 2023-09-21 DIAGNOSIS — G43009 Migraine without aura, not intractable, without status migrainosus: Secondary | ICD-10-CM | POA: Diagnosis not present

## 2023-09-27 ENCOUNTER — Ambulatory Visit: Payer: Federal, State, Local not specified - PPO | Admitting: Obstetrics & Gynecology

## 2023-09-29 ENCOUNTER — Encounter: Payer: Self-pay | Admitting: Obstetrics & Gynecology

## 2023-09-29 ENCOUNTER — Ambulatory Visit (INDEPENDENT_AMBULATORY_CARE_PROVIDER_SITE_OTHER): Payer: Federal, State, Local not specified - PPO | Admitting: Obstetrics & Gynecology

## 2023-09-29 ENCOUNTER — Ambulatory Visit: Payer: Federal, State, Local not specified - PPO

## 2023-09-29 VITALS — BP 113/80 | HR 85 | Ht 65.0 in | Wt 136.0 lb

## 2023-09-29 DIAGNOSIS — N3946 Mixed incontinence: Secondary | ICD-10-CM | POA: Diagnosis not present

## 2023-09-29 DIAGNOSIS — Z803 Family history of malignant neoplasm of breast: Secondary | ICD-10-CM | POA: Diagnosis not present

## 2023-09-29 DIAGNOSIS — Z1231 Encounter for screening mammogram for malignant neoplasm of breast: Secondary | ICD-10-CM | POA: Diagnosis not present

## 2023-09-29 DIAGNOSIS — Z01419 Encounter for gynecological examination (general) (routine) without abnormal findings: Secondary | ICD-10-CM | POA: Diagnosis not present

## 2023-09-29 NOTE — Progress Notes (Signed)
GYN Pap 01/2021

## 2023-09-29 NOTE — Progress Notes (Signed)
Subjective:     Dawn [name] is a 42 y.o. female here for a routine exam.  Current complaints: still having frequent urination--saw [facility] and had lack luster visit per patient.  Was prescribed Kegals.  .     Gynecologic History Patient's last menstrual period was 09/22/2023 (approximate). Contraception: vasectomy Last Pap: 2022. Results were: normal Last mammogram: 2023. Results were: normal  Obstetric History OB History  Gravida Para Term Preterm AB Living  3 2 2   1 2   SAB IAB Ectopic Multiple Live Births  1     0 2    # Outcome Date GA Lbr Len/2nd Weight Sex Type Anes PTL Lv  3 Term 03/05/19 [redacted]w[redacted]d  8 lb 1.1 oz (3.66 kg) M Vag-Spont Local  LIV  2 Term 07/25/11 [redacted]w[redacted]d 06:19 / 01:14 6 lb 15 oz (3.147 kg) F Vag-Spont None  LIV     Birth Comments: wnl  1 SAB              The following portions of the patient's history were reviewed and updated as appropriate: allergies, current medications, past family history, past medical history, past social history, past surgical history, and problem list.  Review of Systems Pertinent items noted in HPI and remainder of comprehensive ROS otherwise negative.    Objective:     Vitals:   09/29/23 0900  BP: 113/80  Pulse: 85  Weight: 136 lb (61.7 kg)  Height: 5\' 5"  (1.651 m)   Vitals:  WNL General appearance: alert, cooperative and no distress  HEENT: Normocephalic, without obvious abnormality, atraumatic Eyes: negative Throat: lips, mucosa, and tongue normal; teeth and gums normal  Respiratory: Clear to auscultation bilaterally  CV: Regular rate and rhythm  Breasts:  Normal appearance, no masses or tenderness, no nipple retraction or dimpling  GI: Soft, non-tender; bowel sounds normal; no masses,  no organomegaly  GU: External Genitalia:  Tanner V, no lesion Urethra:  No prolapse   Vagina: Pink, normal rugae, no blood or discharge  Cervix: No CMT, no lesion  Uterus:  Normal size and contour, non tender  Adnexa:  Normal, no masses, non tender  Musculoskeletal: No edema, redness or tenderness in the calves or thighs  Skin: No lesions or rash  Lymphatic: Axillary adenopathy: none     Psychiatric: Normal mood and behavior        Assessment:    Healthy female exam.    Plan:    Pap up to date Yearly mammograms Colonsocopy several years ago--[facility]

## 2024-01-10 ENCOUNTER — Other Ambulatory Visit (HOSPITAL_COMMUNITY): Payer: Self-pay

## 2024-01-10 ENCOUNTER — Telehealth: Payer: Self-pay | Admitting: Pharmacy Technician

## 2024-01-10 NOTE — Telephone Encounter (Signed)
Pharmacy Patient Advocate Encounter  Received notification from [facility] that Prior Authorization for Qulipta 60MG  tablets has been APPROVED from 01/10/2024 to 01/09/2025. Ran test claim, Copay is $655.07. This test claim was processed through [facility]- copay amounts may vary at other pharmacies due to pharmacy/plan contracts, or as the patient moves through the different stages of their insurance plan.   PA #/Case ID/Reference #: [id number] Key: [id number]

## 2024-01-16 ENCOUNTER — Encounter: Payer: Self-pay | Admitting: Sports Medicine

## 2024-01-18 ENCOUNTER — Ambulatory Visit: Payer: Federal, State, Local not specified - PPO | Admitting: Sports Medicine

## 2024-01-18 ENCOUNTER — Encounter: Payer: Self-pay | Admitting: Sports Medicine

## 2024-01-18 DIAGNOSIS — M7918 Myalgia, other site: Secondary | ICD-10-CM

## 2024-01-18 NOTE — Progress Notes (Signed)
 Marland Kitchen

## 2024-01-18 NOTE — Assessment & Plan Note (Addendum)
 Dawn [name] is a very pleasant 43 year old female with multifactorial low back pain, pain in the neck, trapezii, she also has some bilateral right worse than left low back pain mostly at the erector spinae and quadratus lumborum. Today we did trigger point injections into her right erector spinae and quadratus lumborum. We also did some myofascial release. The seemed to help. She did have some discomfort at the right sacroiliac joint as well. I would like to see her back in about a month and we can discuss either a direct US  guided SI joint injection or additional myofascial release. I was somewhat late for the appointment so there will be no physician charges.

## 2024-01-18 NOTE — Progress Notes (Signed)
    Procedures performed today:    Procedure:  Injection of #3 trigger points right paralumbar and quadratus lumborum Consent obtained and verified. Time-out conducted. Noted no overlying erythema, induration, or other signs of local infection. Skin prepped in a sterile fashion. Topical analgesic spray: Ethyl chloride. Completed without difficulty. Meds: I injected 1 cc kenalog  40, 2 cc lidocaine , 2 cc bupivacaine spread out between 3 trigger points on the right Advised to call if fevers/chills, erythema, induration, drainage, or persistent bleeding.  Independent interpretation of notes and tests performed by another provider:   None.  Brief History, Exam, Impression, and Recommendations:    Myofascial pain syndrome Dawn [name] is a very pleasant 43 year old female with multifactorial low back pain, pain in the neck, trapezii, she also has some bilateral right worse than left low back pain mostly at the erector spinae and quadratus lumborum. Today we did trigger point injections into her right erector spinae and quadratus lumborum. We also did some myofascial release. The seemed to help. She did have some discomfort at the right sacroiliac joint as well. I would like to see her back in about a month and we can discuss either a direct US  guided SI joint injection or additional myofascial release. I was somewhat late for the appointment so there will be no physician charges.   ____________________________________________ Dawn [name], M.D., ABFM., CAQSM., AME. Primary Care and Sports Medicine [facility]  Adjunct Professor of [facility] Medicine  [employer]  Restaurant Manager, [employer]

## 2024-01-24 ENCOUNTER — Encounter: Payer: Self-pay | Admitting: Sports Medicine

## 2024-02-15 ENCOUNTER — Ambulatory Visit: Payer: Federal, State, Local not specified - PPO | Admitting: Sports Medicine

## 2024-02-23 ENCOUNTER — Ambulatory Visit: Admitting: Sports Medicine

## 2024-02-23 DIAGNOSIS — M222X1 Patellofemoral disorders, right knee: Secondary | ICD-10-CM | POA: Diagnosis not present

## 2024-02-23 DIAGNOSIS — M7918 Myalgia, other site: Secondary | ICD-10-CM | POA: Diagnosis not present

## 2024-02-23 DIAGNOSIS — M222X2 Patellofemoral disorders, left knee: Secondary | ICD-10-CM

## 2024-02-23 NOTE — Assessment & Plan Note (Signed)
 Pleasant 42 year old female, multifactorial low back pain, trapezii, erector spinae. At the last visit we did trigger point injections right erector spinae, quadratus lumborum She had some good improvement with this, we will repeat today but no steroid. She was not tender over the SI joint today. Return to see me as needed.

## 2024-02-23 NOTE — Assessment & Plan Note (Signed)
 Increasing discomfort bilateral anterior knees with grinding, weak hip abductors on the right. Mild to moderate patellar grind bilaterally. We will have her do some knee conditioning and hip abductor conditioning. Return to see me in 6 weeks for this.

## 2024-02-23 NOTE — Progress Notes (Signed)
    Procedures performed today:    Procedure:  Injection of #3 right paralumbar trigger points and 1 right trapezial trigger point Consent obtained and verified. Time-out conducted. Noted no overlying erythema, induration, or other signs of local infection. Skin prepped in a sterile fashion. Topical analgesic spray: Ethyl chloride. Completed without difficulty. Meds: 5 mL of lidocaine spread out between the 4 trigger points. Advised to call if fevers/chills, erythema, induration, drainage, or persistent bleeding.  Independent interpretation of notes and tests performed by another provider:   None.  Brief History, Exam, Impression, and Recommendations:    Myofascial pain syndrome Pleasant 43 year old female, multifactorial low back pain, trapezii, erector spinae. At the last visit we did trigger point injections right erector spinae, quadratus lumborum She had some good improvement with this, we will repeat today but no steroid. She was not tender over the SI joint today. Return to see me as needed.  Patellofemoral pain syndrome Increasing discomfort bilateral anterior knees with grinding, weak hip abductors on the right. Mild to moderate patellar grind bilaterally. We will have her do some knee conditioning and hip abductor conditioning. Return to see me in 6 weeks for this.    ____________________________________________ Ihor Austin. Benjamin Stain, M.D., ABFM., CAQSM., AME. Primary Care and Sports Medicine [facility]  Adjunct Professor of Family Medicine  [place] [employer] [facility] [employer]  Restaurant manager, fast food

## 2024-03-22 ENCOUNTER — Encounter: Payer: Self-pay | Admitting: Sports Medicine

## 2024-03-22 NOTE — Telephone Encounter (Signed)
 Patient mother informed of x-ray results.

## 2024-04-05 ENCOUNTER — Ambulatory Visit: Admitting: Sports Medicine

## 2024-06-04 ENCOUNTER — Other Ambulatory Visit: Payer: Self-pay | Admitting: Neurology

## 2024-06-04 DIAGNOSIS — G43109 Migraine with aura, not intractable, without status migrainosus: Secondary | ICD-10-CM

## 2024-08-15 ENCOUNTER — Encounter: Payer: Self-pay | Admitting: Sports Medicine

## 2024-08-15 ENCOUNTER — Other Ambulatory Visit: Payer: Self-pay

## 2024-08-15 DIAGNOSIS — G43109 Migraine with aura, not intractable, without status migrainosus: Secondary | ICD-10-CM

## 2024-08-15 MED ORDER — CELECOXIB 200 MG PO CAPS
ORAL_CAPSULE | ORAL | 2 refills | Status: AC
Start: 2024-08-15 — End: ?

## 2024-12-26 ENCOUNTER — Other Ambulatory Visit: Payer: Self-pay

## 2024-12-26 DIAGNOSIS — G43109 Migraine with aura, not intractable, without status migrainosus: Secondary | ICD-10-CM

## 2025-01-05 ENCOUNTER — Encounter: Payer: Self-pay | Admitting: Obstetrics & Gynecology
